# Patient Record
Sex: Female | Born: 1960 | Race: White | Hispanic: No | Marital: Married | Smoking: Never smoker
Health system: Southern US, Community
[De-identification: ages and names within clinical notes are randomized; demographics above are authoritative.]

## PROBLEM LIST (undated history)

## (undated) DIAGNOSIS — F329 Major depressive disorder, single episode, unspecified: Secondary | ICD-10-CM

## (undated) DIAGNOSIS — I10 Essential (primary) hypertension: Secondary | ICD-10-CM

## (undated) DIAGNOSIS — T8859XA Other complications of anesthesia, initial encounter: Secondary | ICD-10-CM

## (undated) DIAGNOSIS — R519 Headache, unspecified: Secondary | ICD-10-CM

## (undated) DIAGNOSIS — F419 Anxiety disorder, unspecified: Secondary | ICD-10-CM

## (undated) DIAGNOSIS — K219 Gastro-esophageal reflux disease without esophagitis: Secondary | ICD-10-CM

## (undated) DIAGNOSIS — M542 Cervicalgia: Secondary | ICD-10-CM

## (undated) DIAGNOSIS — R51 Headache: Secondary | ICD-10-CM

## (undated) DIAGNOSIS — D649 Anemia, unspecified: Secondary | ICD-10-CM

## (undated) DIAGNOSIS — M1990 Unspecified osteoarthritis, unspecified site: Secondary | ICD-10-CM

## (undated) DIAGNOSIS — K2970 Gastritis, unspecified, without bleeding: Secondary | ICD-10-CM

## (undated) DIAGNOSIS — D509 Iron deficiency anemia, unspecified: Principal | ICD-10-CM

## (undated) DIAGNOSIS — T4145XA Adverse effect of unspecified anesthetic, initial encounter: Secondary | ICD-10-CM

## (undated) DIAGNOSIS — F32A Depression, unspecified: Secondary | ICD-10-CM

## (undated) HISTORY — PX: COLONOSCOPY: SHX174

## (undated) HISTORY — DX: Iron deficiency anemia, unspecified: D50.9

## (undated) HISTORY — PX: CHOLECYSTECTOMY: SHX55

## (undated) HISTORY — PX: TONSILLECTOMY: SUR1361

## (undated) HISTORY — PX: APPENDECTOMY: SHX54

---

## 2000-09-23 HISTORY — PX: GASTRIC BYPASS: SHX52

## 2005-07-24 ENCOUNTER — Ambulatory Visit: Payer: Self-pay | Admitting: Obstetrics and Gynecology

## 2005-11-13 ENCOUNTER — Ambulatory Visit: Payer: Self-pay | Admitting: Internal Medicine

## 2005-11-21 ENCOUNTER — Ambulatory Visit: Payer: Self-pay | Admitting: Internal Medicine

## 2005-12-22 ENCOUNTER — Ambulatory Visit: Payer: Self-pay | Admitting: Internal Medicine

## 2006-01-21 ENCOUNTER — Ambulatory Visit: Payer: Self-pay | Admitting: Internal Medicine

## 2006-02-21 ENCOUNTER — Ambulatory Visit: Payer: Self-pay | Admitting: Internal Medicine

## 2006-03-23 ENCOUNTER — Ambulatory Visit: Payer: Self-pay | Admitting: Internal Medicine

## 2006-04-23 ENCOUNTER — Ambulatory Visit: Payer: Self-pay | Admitting: Internal Medicine

## 2006-05-24 ENCOUNTER — Ambulatory Visit: Payer: Self-pay | Admitting: Internal Medicine

## 2006-06-24 ENCOUNTER — Ambulatory Visit: Payer: Self-pay | Admitting: Internal Medicine

## 2006-07-24 ENCOUNTER — Ambulatory Visit: Payer: Self-pay | Admitting: Internal Medicine

## 2006-09-04 ENCOUNTER — Ambulatory Visit: Payer: Self-pay | Admitting: Obstetrics and Gynecology

## 2006-09-09 ENCOUNTER — Ambulatory Visit: Payer: Self-pay | Admitting: Obstetrics and Gynecology

## 2006-09-23 HISTORY — PX: ABDOMINAL HYSTERECTOMY: SHX81

## 2006-11-11 ENCOUNTER — Ambulatory Visit: Payer: Self-pay | Admitting: Internal Medicine

## 2006-11-22 ENCOUNTER — Ambulatory Visit: Payer: Self-pay | Admitting: Internal Medicine

## 2007-01-06 ENCOUNTER — Ambulatory Visit: Payer: Self-pay | Admitting: Internal Medicine

## 2007-01-22 ENCOUNTER — Ambulatory Visit: Payer: Self-pay | Admitting: Internal Medicine

## 2007-02-22 ENCOUNTER — Ambulatory Visit: Payer: Self-pay | Admitting: Internal Medicine

## 2007-03-24 ENCOUNTER — Ambulatory Visit: Payer: Self-pay | Admitting: Internal Medicine

## 2007-04-24 ENCOUNTER — Ambulatory Visit: Payer: Self-pay | Admitting: Internal Medicine

## 2007-05-25 ENCOUNTER — Ambulatory Visit: Payer: Self-pay | Admitting: Internal Medicine

## 2007-07-02 ENCOUNTER — Ambulatory Visit: Payer: Self-pay | Admitting: Internal Medicine

## 2007-07-25 ENCOUNTER — Ambulatory Visit: Payer: Self-pay | Admitting: Internal Medicine

## 2007-09-24 ENCOUNTER — Ambulatory Visit: Payer: Self-pay | Admitting: Internal Medicine

## 2007-10-21 ENCOUNTER — Ambulatory Visit: Payer: Self-pay | Admitting: Internal Medicine

## 2007-10-25 ENCOUNTER — Ambulatory Visit: Payer: Self-pay | Admitting: Internal Medicine

## 2007-12-23 ENCOUNTER — Ambulatory Visit: Payer: Self-pay | Admitting: Internal Medicine

## 2008-01-13 ENCOUNTER — Ambulatory Visit: Payer: Self-pay | Admitting: Internal Medicine

## 2008-01-22 ENCOUNTER — Ambulatory Visit: Payer: Self-pay | Admitting: Internal Medicine

## 2008-02-23 ENCOUNTER — Ambulatory Visit: Payer: Self-pay | Admitting: Internal Medicine

## 2008-03-23 ENCOUNTER — Ambulatory Visit: Payer: Self-pay | Admitting: Internal Medicine

## 2008-04-23 ENCOUNTER — Ambulatory Visit: Payer: Self-pay | Admitting: Internal Medicine

## 2008-05-25 ENCOUNTER — Ambulatory Visit: Payer: Self-pay | Admitting: Internal Medicine

## 2008-06-23 ENCOUNTER — Ambulatory Visit: Payer: Self-pay | Admitting: Internal Medicine

## 2008-07-24 ENCOUNTER — Ambulatory Visit: Payer: Self-pay | Admitting: Internal Medicine

## 2008-08-23 ENCOUNTER — Ambulatory Visit: Payer: Self-pay | Admitting: Internal Medicine

## 2008-09-23 ENCOUNTER — Ambulatory Visit: Payer: Self-pay | Admitting: Internal Medicine

## 2008-09-30 ENCOUNTER — Ambulatory Visit: Payer: Self-pay | Admitting: Internal Medicine

## 2008-10-24 ENCOUNTER — Ambulatory Visit: Payer: Self-pay | Admitting: Internal Medicine

## 2008-11-21 ENCOUNTER — Ambulatory Visit: Payer: Self-pay | Admitting: Internal Medicine

## 2008-12-22 ENCOUNTER — Ambulatory Visit: Payer: Self-pay | Admitting: Internal Medicine

## 2009-01-21 ENCOUNTER — Ambulatory Visit: Payer: Self-pay | Admitting: Internal Medicine

## 2009-02-21 ENCOUNTER — Ambulatory Visit: Payer: Self-pay | Admitting: Internal Medicine

## 2009-03-23 ENCOUNTER — Ambulatory Visit: Payer: Self-pay | Admitting: Internal Medicine

## 2009-04-23 ENCOUNTER — Ambulatory Visit: Payer: Self-pay | Admitting: Internal Medicine

## 2009-05-24 ENCOUNTER — Ambulatory Visit: Payer: Self-pay | Admitting: Internal Medicine

## 2009-06-23 ENCOUNTER — Ambulatory Visit: Payer: Self-pay | Admitting: Internal Medicine

## 2009-07-24 ENCOUNTER — Ambulatory Visit: Payer: Self-pay | Admitting: Internal Medicine

## 2009-08-23 ENCOUNTER — Ambulatory Visit: Payer: Self-pay | Admitting: Internal Medicine

## 2009-09-23 ENCOUNTER — Ambulatory Visit: Payer: Self-pay | Admitting: Internal Medicine

## 2009-09-29 ENCOUNTER — Ambulatory Visit: Payer: Self-pay | Admitting: Internal Medicine

## 2009-10-24 ENCOUNTER — Ambulatory Visit: Payer: Self-pay | Admitting: Internal Medicine

## 2009-11-21 ENCOUNTER — Ambulatory Visit: Payer: Self-pay | Admitting: Internal Medicine

## 2009-12-14 ENCOUNTER — Ambulatory Visit: Payer: Self-pay | Admitting: Obstetrics and Gynecology

## 2009-12-22 ENCOUNTER — Ambulatory Visit: Payer: Self-pay | Admitting: Internal Medicine

## 2010-01-21 ENCOUNTER — Ambulatory Visit: Payer: Self-pay | Admitting: Internal Medicine

## 2010-02-21 ENCOUNTER — Ambulatory Visit: Payer: Self-pay | Admitting: Internal Medicine

## 2010-03-23 ENCOUNTER — Ambulatory Visit: Payer: Self-pay | Admitting: Internal Medicine

## 2010-04-23 ENCOUNTER — Ambulatory Visit: Payer: Self-pay | Admitting: Internal Medicine

## 2010-05-24 ENCOUNTER — Ambulatory Visit: Payer: Self-pay | Admitting: Internal Medicine

## 2010-06-23 ENCOUNTER — Ambulatory Visit: Payer: Self-pay | Admitting: Internal Medicine

## 2010-07-24 ENCOUNTER — Ambulatory Visit: Payer: Self-pay | Admitting: Internal Medicine

## 2010-08-08 ENCOUNTER — Ambulatory Visit: Payer: Self-pay | Admitting: Obstetrics and Gynecology

## 2010-08-13 ENCOUNTER — Inpatient Hospital Stay: Payer: Self-pay | Admitting: Obstetrics and Gynecology

## 2010-08-16 ENCOUNTER — Inpatient Hospital Stay: Payer: Self-pay | Admitting: Obstetrics and Gynecology

## 2010-08-23 ENCOUNTER — Ambulatory Visit: Payer: Self-pay | Admitting: Internal Medicine

## 2010-09-23 ENCOUNTER — Ambulatory Visit: Payer: Self-pay | Admitting: Internal Medicine

## 2010-10-24 ENCOUNTER — Ambulatory Visit: Payer: Self-pay | Admitting: Internal Medicine

## 2010-11-22 ENCOUNTER — Ambulatory Visit: Payer: Self-pay | Admitting: Internal Medicine

## 2010-12-20 ENCOUNTER — Ambulatory Visit: Payer: Self-pay | Admitting: Obstetrics and Gynecology

## 2010-12-23 ENCOUNTER — Ambulatory Visit: Payer: Self-pay | Admitting: Internal Medicine

## 2011-01-14 ENCOUNTER — Ambulatory Visit: Payer: Self-pay | Admitting: Gastroenterology

## 2011-01-22 ENCOUNTER — Ambulatory Visit: Payer: Self-pay | Admitting: Internal Medicine

## 2011-02-22 ENCOUNTER — Ambulatory Visit: Payer: Self-pay | Admitting: Internal Medicine

## 2011-03-24 ENCOUNTER — Ambulatory Visit: Payer: Self-pay | Admitting: Internal Medicine

## 2011-04-24 ENCOUNTER — Ambulatory Visit: Payer: Self-pay | Admitting: Internal Medicine

## 2011-06-12 ENCOUNTER — Ambulatory Visit: Payer: Self-pay | Admitting: Internal Medicine

## 2011-06-24 ENCOUNTER — Ambulatory Visit: Payer: Self-pay | Admitting: Internal Medicine

## 2011-07-25 ENCOUNTER — Ambulatory Visit: Payer: Self-pay | Admitting: Internal Medicine

## 2011-08-08 ENCOUNTER — Ambulatory Visit: Payer: Self-pay

## 2011-08-24 ENCOUNTER — Ambulatory Visit: Payer: Self-pay | Admitting: Internal Medicine

## 2011-09-24 ENCOUNTER — Ambulatory Visit: Payer: Self-pay | Admitting: Internal Medicine

## 2011-10-25 ENCOUNTER — Ambulatory Visit: Payer: Self-pay | Admitting: Internal Medicine

## 2011-11-22 ENCOUNTER — Ambulatory Visit: Payer: Self-pay | Admitting: Internal Medicine

## 2011-12-02 LAB — IRON AND TIBC
Iron Bind.Cap.(Total): 233 ug/dL — ABNORMAL LOW (ref 250–450)
Iron Saturation: 25 %
Iron: 58 ug/dL (ref 50–170)
Unbound Iron-Bind.Cap.: 175 ug/dL

## 2011-12-02 LAB — CANCER CENTER HEMOGLOBIN: HGB: 12.4 g/dL (ref 12.0–16.0)

## 2011-12-23 ENCOUNTER — Ambulatory Visit: Payer: Self-pay | Admitting: Internal Medicine

## 2012-01-03 ENCOUNTER — Ambulatory Visit: Payer: Self-pay | Admitting: Obstetrics and Gynecology

## 2012-01-20 ENCOUNTER — Ambulatory Visit: Payer: Self-pay | Admitting: Pain Medicine

## 2012-01-22 ENCOUNTER — Ambulatory Visit: Payer: Self-pay | Admitting: Internal Medicine

## 2012-01-27 ENCOUNTER — Ambulatory Visit: Payer: Self-pay | Admitting: Pain Medicine

## 2012-02-03 ENCOUNTER — Ambulatory Visit: Payer: Self-pay | Admitting: Pain Medicine

## 2012-02-22 ENCOUNTER — Ambulatory Visit: Payer: Self-pay | Admitting: Internal Medicine

## 2012-02-27 LAB — HEMOGLOBIN: HGB: 13.4 g/dL (ref 12.0–16.0)

## 2012-03-23 ENCOUNTER — Ambulatory Visit: Payer: Self-pay | Admitting: Internal Medicine

## 2012-04-17 LAB — CBC CANCER CENTER
Basophil %: 0.2 %
Eosinophil #: 0.2 x10 3/mm (ref 0.0–0.7)
HCT: 40.5 % (ref 35.0–47.0)
HGB: 13.5 g/dL (ref 12.0–16.0)
Lymphocyte #: 2 x10 3/mm (ref 1.0–3.6)
MCH: 32.5 pg (ref 26.0–34.0)
MCV: 97 fL (ref 80–100)
Monocyte #: 0.7 x10 3/mm (ref 0.2–0.9)
Monocyte %: 8.4 %
Neutrophil %: 62.6 %
RBC: 4.16 10*6/uL (ref 3.80–5.20)
RDW: 12.8 % (ref 11.5–14.5)
WBC: 7.7 x10 3/mm (ref 3.6–11.0)

## 2012-04-17 LAB — FERRITIN: Ferritin (ARMC): 179 ng/mL (ref 8–388)

## 2012-04-17 LAB — IRON AND TIBC: Iron: 66 ug/dL (ref 50–170)

## 2012-04-23 ENCOUNTER — Ambulatory Visit: Payer: Self-pay | Admitting: Internal Medicine

## 2012-05-24 ENCOUNTER — Ambulatory Visit: Payer: Self-pay | Admitting: Internal Medicine

## 2012-06-09 ENCOUNTER — Encounter: Payer: Self-pay | Admitting: Neurology

## 2012-06-23 ENCOUNTER — Encounter: Payer: Self-pay | Admitting: Neurology

## 2012-07-03 ENCOUNTER — Ambulatory Visit: Payer: Self-pay | Admitting: Internal Medicine

## 2012-07-03 LAB — FERRITIN: Ferritin (ARMC): 139 ng/mL (ref 8–388)

## 2012-07-03 LAB — CANCER CENTER HEMOGLOBIN: HGB: 13.2 g/dL (ref 12.0–16.0)

## 2012-07-24 ENCOUNTER — Ambulatory Visit: Payer: Self-pay | Admitting: Internal Medicine

## 2012-08-10 ENCOUNTER — Ambulatory Visit: Payer: Self-pay | Admitting: Neurology

## 2012-09-14 ENCOUNTER — Ambulatory Visit: Payer: Self-pay | Admitting: Internal Medicine

## 2012-09-14 LAB — CANCER CENTER HEMOGLOBIN: HGB: 13.7 g/dL (ref 12.0–16.0)

## 2012-09-14 LAB — FERRITIN: Ferritin (ARMC): 195 ng/mL (ref 8–388)

## 2012-09-23 ENCOUNTER — Ambulatory Visit: Payer: Self-pay | Admitting: Internal Medicine

## 2012-10-24 ENCOUNTER — Ambulatory Visit: Payer: Self-pay | Admitting: Internal Medicine

## 2012-11-21 ENCOUNTER — Ambulatory Visit: Payer: Self-pay | Admitting: Internal Medicine

## 2012-12-22 ENCOUNTER — Ambulatory Visit: Payer: Self-pay | Admitting: Internal Medicine

## 2013-01-21 ENCOUNTER — Ambulatory Visit: Payer: Self-pay | Admitting: Internal Medicine

## 2013-01-29 LAB — CANCER CENTER HEMOGLOBIN: HGB: 13.3 g/dL (ref 12.0–16.0)

## 2013-02-21 ENCOUNTER — Ambulatory Visit: Payer: Self-pay | Admitting: Internal Medicine

## 2013-03-23 ENCOUNTER — Ambulatory Visit: Payer: Self-pay | Admitting: Internal Medicine

## 2013-04-02 ENCOUNTER — Ambulatory Visit: Payer: Self-pay | Admitting: Obstetrics and Gynecology

## 2013-04-02 LAB — CANCER CENTER HEMOGLOBIN: HGB: 12.7 g/dL (ref 12.0–16.0)

## 2013-04-23 ENCOUNTER — Ambulatory Visit: Payer: Self-pay | Admitting: Internal Medicine

## 2013-05-24 ENCOUNTER — Ambulatory Visit: Payer: Self-pay | Admitting: Internal Medicine

## 2013-06-25 ENCOUNTER — Ambulatory Visit: Payer: Self-pay | Admitting: Internal Medicine

## 2013-06-25 LAB — CANCER CENTER HEMOGLOBIN: HGB: 13.6 g/dL (ref 12.0–16.0)

## 2013-06-25 LAB — FERRITIN: Ferritin (ARMC): 80 ng/mL (ref 8–388)

## 2013-07-24 ENCOUNTER — Ambulatory Visit: Payer: Self-pay | Admitting: Internal Medicine

## 2013-08-27 ENCOUNTER — Ambulatory Visit: Payer: Self-pay | Admitting: Internal Medicine

## 2013-08-27 LAB — CANCER CENTER HEMOGLOBIN: HGB: 13.9 g/dL (ref 12.0–16.0)

## 2013-08-27 LAB — FERRITIN: Ferritin (ARMC): 118 ng/mL (ref 8–388)

## 2013-09-23 ENCOUNTER — Ambulatory Visit: Payer: Self-pay | Admitting: Internal Medicine

## 2013-10-28 ENCOUNTER — Ambulatory Visit: Payer: Self-pay | Admitting: Internal Medicine

## 2013-10-30 ENCOUNTER — Emergency Department: Payer: Self-pay | Admitting: Emergency Medicine

## 2013-11-06 ENCOUNTER — Ambulatory Visit: Payer: Self-pay | Admitting: Physical Medicine and Rehabilitation

## 2013-11-29 ENCOUNTER — Ambulatory Visit: Payer: Self-pay | Admitting: Internal Medicine

## 2013-11-29 LAB — FERRITIN: Ferritin (ARMC): 81 ng/mL (ref 8–388)

## 2013-11-29 LAB — CANCER CENTER HEMOGLOBIN: HGB: 13.1 g/dL (ref 12.0–16.0)

## 2013-12-22 ENCOUNTER — Ambulatory Visit: Payer: Self-pay | Admitting: Internal Medicine

## 2013-12-22 HISTORY — PX: JOINT REPLACEMENT: SHX530

## 2013-12-24 ENCOUNTER — Ambulatory Visit: Payer: Self-pay | Admitting: Physician Assistant

## 2013-12-24 LAB — BASIC METABOLIC PANEL
Anion Gap: 5 — ABNORMAL LOW (ref 7–16)
BUN: 14 mg/dL (ref 7–18)
CALCIUM: 8.5 mg/dL (ref 8.5–10.1)
CHLORIDE: 105 mmol/L (ref 98–107)
CREATININE: 0.69 mg/dL (ref 0.60–1.30)
Co2: 30 mmol/L (ref 21–32)
Glucose: 96 mg/dL (ref 65–99)
Osmolality: 280 (ref 275–301)
Potassium: 4.6 mmol/L (ref 3.5–5.1)
Sodium: 140 mmol/L (ref 136–145)

## 2013-12-24 LAB — CBC WITH DIFFERENTIAL/PLATELET
Basophil #: 0 10*3/uL (ref 0.0–0.1)
Basophil %: 0.5 %
EOS PCT: 2.7 %
Eosinophil #: 0.2 10*3/uL (ref 0.0–0.7)
HCT: 43.8 % (ref 35.0–47.0)
HGB: 14.4 g/dL (ref 12.0–16.0)
LYMPHS ABS: 1.7 10*3/uL (ref 1.0–3.6)
Lymphocyte %: 23.2 %
MCH: 30.6 pg (ref 26.0–34.0)
MCHC: 32.8 g/dL (ref 32.0–36.0)
MCV: 93 fL (ref 80–100)
Monocyte #: 0.7 x10 3/mm (ref 0.2–0.9)
Monocyte %: 10.2 %
NEUTROS PCT: 63.4 %
Neutrophil #: 4.5 10*3/uL (ref 1.4–6.5)
PLATELETS: 259 10*3/uL (ref 150–440)
RBC: 4.69 10*6/uL (ref 3.80–5.20)
RDW: 13.3 % (ref 11.5–14.5)
WBC: 7.1 10*3/uL (ref 3.6–11.0)

## 2014-01-05 ENCOUNTER — Ambulatory Visit: Payer: Self-pay | Admitting: General Practice

## 2014-01-05 LAB — CBC
HCT: 41.9 % (ref 35.0–47.0)
HGB: 13.6 g/dL (ref 12.0–16.0)
MCH: 29.9 pg (ref 26.0–34.0)
MCHC: 32.4 g/dL (ref 32.0–36.0)
MCV: 92 fL (ref 80–100)
Platelet: 228 10*3/uL (ref 150–440)
RBC: 4.54 10*6/uL (ref 3.80–5.20)
RDW: 13.3 % (ref 11.5–14.5)
WBC: 12.5 10*3/uL — ABNORMAL HIGH (ref 3.6–11.0)

## 2014-01-05 LAB — MRSA PCR SCREENING

## 2014-01-05 LAB — BASIC METABOLIC PANEL
Anion Gap: 4 — ABNORMAL LOW (ref 7–16)
BUN: 14 mg/dL (ref 7–18)
CHLORIDE: 104 mmol/L (ref 98–107)
CO2: 30 mmol/L (ref 21–32)
CREATININE: 0.66 mg/dL (ref 0.60–1.30)
Calcium, Total: 8.9 mg/dL (ref 8.5–10.1)
EGFR (Non-African Amer.): >60
GLUCOSE: 107 mg/dL — AB (ref 65–99)
OSMOLALITY: 277 (ref 275–301)
Potassium: 4 mmol/L (ref 3.5–5.1)
Sodium: 138 mmol/L (ref 136–145)

## 2014-01-05 LAB — URINALYSIS, COMPLETE
Bacteria: NONE SEEN
Blood: NEGATIVE
GLUCOSE, UR: NEGATIVE mg/dL (ref 0–75)
NITRITE: NEGATIVE
Ph: 5 (ref 4.5–8.0)
RBC,UR: 8 /HPF (ref 0–5)
SPECIFIC GRAVITY: 1.035 (ref 1.003–1.030)

## 2014-01-05 LAB — PROTIME-INR
INR: 1
Prothrombin Time: 13 secs (ref 11.5–14.7)

## 2014-01-05 LAB — APTT: ACTIVATED PTT: 26.9 s (ref 23.6–35.9)

## 2014-01-05 LAB — SEDIMENTATION RATE: ERYTHROCYTE SED RATE: 14 mm/h (ref 0–30)

## 2014-01-06 LAB — URINE CULTURE

## 2014-01-17 ENCOUNTER — Inpatient Hospital Stay: Payer: Self-pay | Admitting: General Practice

## 2014-01-18 LAB — BASIC METABOLIC PANEL
ANION GAP: 4 — AB (ref 7–16)
BUN: 8 mg/dL (ref 7–18)
Calcium, Total: 8.4 mg/dL — ABNORMAL LOW (ref 8.5–10.1)
Chloride: 106 mmol/L (ref 98–107)
Co2: 29 mmol/L (ref 21–32)
Creatinine: 0.6 mg/dL (ref 0.60–1.30)
EGFR (African American): >60
Glucose: 124 mg/dL — ABNORMAL HIGH (ref 65–99)
Osmolality: 277 (ref 275–301)
POTASSIUM: 4.3 mmol/L (ref 3.5–5.1)
SODIUM: 139 mmol/L (ref 136–145)

## 2014-01-18 LAB — HEMOGLOBIN: HGB: 10.4 g/dL — AB (ref 12.0–16.0)

## 2014-01-18 LAB — PLATELET COUNT: Platelet: 176 10*3/uL (ref 150–440)

## 2014-01-19 LAB — BASIC METABOLIC PANEL
Anion Gap: 6 — ABNORMAL LOW (ref 7–16)
BUN: 7 mg/dL (ref 7–18)
CHLORIDE: 106 mmol/L (ref 98–107)
Calcium, Total: 8.1 mg/dL — ABNORMAL LOW (ref 8.5–10.1)
Co2: 29 mmol/L (ref 21–32)
Creatinine: 0.54 mg/dL — ABNORMAL LOW (ref 0.60–1.30)
EGFR (Non-African Amer.): >60
Glucose: 100 mg/dL — ABNORMAL HIGH (ref 65–99)
Osmolality: 279 (ref 275–301)
Potassium: 3.7 mmol/L (ref 3.5–5.1)
SODIUM: 141 mmol/L (ref 136–145)

## 2014-01-19 LAB — PLATELET COUNT: Platelet: 136 10*3/uL — ABNORMAL LOW (ref 150–440)

## 2014-01-19 LAB — HEMOGLOBIN: HGB: 9.4 g/dL — ABNORMAL LOW (ref 12.0–16.0)

## 2014-01-19 LAB — PATHOLOGY REPORT

## 2014-02-23 ENCOUNTER — Other Ambulatory Visit: Payer: Self-pay | Admitting: Neurosurgery

## 2014-03-03 ENCOUNTER — Encounter (HOSPITAL_COMMUNITY): Payer: Self-pay | Admitting: Pharmacy Technician

## 2014-03-04 ENCOUNTER — Inpatient Hospital Stay (HOSPITAL_COMMUNITY)
Admission: RE | Admit: 2014-03-04 | Discharge: 2014-03-04 | Disposition: A | Discharge status: Home / Self Care | Payer: Self-pay | Source: Ambulatory Visit

## 2014-03-04 NOTE — Pre-Procedure Instructions (Signed)
[name]  03/04/2014   Your procedure is scheduled on:  Monday June 22 nd at 1148 AM  Report to [facility] Admitting at [phone] AM.  Call this number if you have problems the morning of surgery: [phone]   Remember:   Do not eat food or drink liquids after midnight Sunday.   Take these medicines the morning of surgery with A SIP OF WATER: Gabapentin (Neurontin), Omeprazole (Prilosec), and Tramadol (Ultram) if needed for pain.  Stop Aspirin ,Nsaids (Meloxicam, Aleve, Advil, Naproxen, Ibuprofen), Multivitamins and herbal meds 7 days prior to surgery.   Do not wear jewelry, make-up or nail polish.  Do not wear lotions, powders, or perfumes. You may wear deodorant.  Do not shave 48 hours prior to surgery.   Do not bring valuables to the hospital.  [facility] is not responsible  for any belongings or valuables.                                 Contacts, dentures or bridgework may not be worn into surgery.  Leave suitcase in the car. After surgery it may be brought to your room.  For patients admitted to the hospital, discharge time is determined by your  treatment team.               Patients discharged the day of surgery will not be allowed to drive home.  Special Instructions: [place] - Preparing for Surgery  Before surgery, you can play an important role.  Because skin is not sterile, your skin needs to be as free of germs as possible.  You can reduce the number of germs on you skin by washing with CHG (chlorahexidine gluconate) soap before surgery.  CHG is an antiseptic cleaner which kills germs and bonds with the skin to continue killing germs even after washing.  Please DO NOT use if you have an allergy to CHG or antibacterial soaps.  If your skin becomes reddened/irritated stop using the CHG and inform your nurse when you arrive at Short Stay.  Do not shave (including legs and underarms) for at least 48 hours prior to the first CHG shower.  You may  shave your face.  Please follow these instructions carefully:   1.  Shower with CHG Soap the night before surgery and the                                morning of Surgery.  2.  If you choose to wash your hair, wash your hair first as usual with your       normal shampoo.  3.  After you shampoo, rinse your hair and body thoroughly to remove the                      Shampoo.  4.  Use CHG as you would any other liquid soap.  You can apply chg directly       to the skin and wash gently with scrungie or a clean washcloth.  5.  Apply the CHG Soap to your body ONLY FROM THE NECK DOWN.        Do not use on open wounds or open sores.  Avoid contact with your eyes,       ears, mouth and genitals (private parts).  Wash genitals (private parts)  with your normal soap.  6.  Wash thoroughly, paying special attention to the area where your surgery        will be performed.  7.  Thoroughly rinse your body with warm water from the neck down.  8.  DO NOT shower/wash with your normal soap after using and rinsing off       the CHG Soap.  9.  Pat yourself dry with a clean towel.            10.  Wear clean pajamas.            11.  Place clean sheets on your bed the night of your first shower and do not        sleep with pets.  Day of Surgery  Do not apply any lotions/deoderants the morning of surgery.  Please wear clean clothes to the hospital/surgery center.      Please read over the following fact sheets that you were given: Pain Booklet, Coughing and Deep Breathing, Blood Transfusion Information, MRSA Information and Surgical Site Infection Prevention

## 2014-03-08 ENCOUNTER — Encounter (HOSPITAL_COMMUNITY): Payer: Self-pay

## 2014-03-08 ENCOUNTER — Encounter (HOSPITAL_COMMUNITY)
Admission: RE | Admit: 2014-03-08 | Discharge: 2014-03-08 | Disposition: A | Discharge status: Home / Self Care | Payer: BC Managed Care – PPO | Source: Ambulatory Visit | Attending: Anesthesiology | Admitting: Anesthesiology

## 2014-03-08 ENCOUNTER — Encounter (HOSPITAL_COMMUNITY)
Admission: RE | Admit: 2014-03-08 | Discharge: 2014-03-08 | Disposition: A | Discharge status: Home / Self Care | Payer: BC Managed Care – PPO | Source: Ambulatory Visit | Attending: Neurosurgery | Admitting: Neurosurgery

## 2014-03-08 DIAGNOSIS — Z01812 Encounter for preprocedural laboratory examination: Secondary | ICD-10-CM | POA: Insufficient documentation

## 2014-03-08 DIAGNOSIS — Z01818 Encounter for other preprocedural examination: Secondary | ICD-10-CM | POA: Diagnosis not present

## 2014-03-08 DIAGNOSIS — Z01810 Encounter for preprocedural cardiovascular examination: Secondary | ICD-10-CM | POA: Diagnosis not present

## 2014-03-08 HISTORY — DX: Unspecified osteoarthritis, unspecified site: M19.90

## 2014-03-08 HISTORY — DX: Other complications of anesthesia, initial encounter: T88.59XA

## 2014-03-08 HISTORY — DX: Essential (primary) hypertension: I10

## 2014-03-08 HISTORY — DX: Anemia, unspecified: D64.9

## 2014-03-08 HISTORY — DX: Adverse effect of unspecified anesthetic, initial encounter: T41.45XA

## 2014-03-08 HISTORY — DX: Gastro-esophageal reflux disease without esophagitis: K21.9

## 2014-03-08 LAB — CBC WITH DIFFERENTIAL/PLATELET
BASOS ABS: 0 10*3/uL (ref 0.0–0.1)
Basophils Relative: 0 % (ref 0–1)
Eosinophils Absolute: 0.2 10*3/uL (ref 0.0–0.7)
Eosinophils Relative: 3 % (ref 0–5)
HEMATOCRIT: 39.9 % (ref 36.0–46.0)
Hemoglobin: 12.4 g/dL (ref 12.0–15.0)
LYMPHS ABS: 2 10*3/uL (ref 0.7–4.0)
Lymphocytes Relative: 31 % (ref 12–46)
MCH: 28.7 pg (ref 26.0–34.0)
MCHC: 31.1 g/dL (ref 30.0–36.0)
MCV: 92.4 fL (ref 78.0–100.0)
Monocytes Absolute: 0.5 10*3/uL (ref 0.1–1.0)
Monocytes Relative: 8 % (ref 3–12)
NEUTROS ABS: 3.8 10*3/uL (ref 1.7–7.7)
Neutrophils Relative %: 58 % (ref 43–77)
PLATELETS: 255 10*3/uL (ref 150–400)
RBC: 4.32 MIL/uL (ref 3.87–5.11)
RDW: 14.1 % (ref 11.5–15.5)
WBC: 6.6 10*3/uL (ref 4.0–10.5)

## 2014-03-08 LAB — SURGICAL PCR SCREEN
MRSA, PCR: NEGATIVE
STAPHYLOCOCCUS AUREUS: NEGATIVE

## 2014-03-08 LAB — TYPE AND SCREEN
ABO/RH(D): B POS
ANTIBODY SCREEN: NEGATIVE

## 2014-03-08 LAB — BASIC METABOLIC PANEL
BUN: 14 mg/dL (ref 6–23)
CALCIUM: 9 mg/dL (ref 8.4–10.5)
CO2: 28 mEq/L (ref 19–32)
Chloride: 103 mEq/L (ref 96–112)
Creatinine, Ser: 0.62 mg/dL (ref 0.50–1.10)
GFR calc non Af Amer: >90 mL/min (ref >90)
Glucose, Bld: 99 mg/dL (ref 70–99)
POTASSIUM: 4.6 meq/L (ref 3.7–5.3)
SODIUM: 141 meq/L (ref 137–147)

## 2014-03-08 LAB — ABO/RH: ABO/RH(D): B POS

## 2014-03-13 MED ORDER — CEFAZOLIN SODIUM-DEXTROSE 2-3 GM-% IV SOLR
2.0000 g | INTRAVENOUS | Status: AC
  Administered 2014-03-14: 2 g via INTRAVENOUS
  Filled 2014-03-13: qty 50

## 2014-03-14 ENCOUNTER — Inpatient Hospital Stay (HOSPITAL_COMMUNITY): Payer: BC Managed Care – PPO

## 2014-03-14 ENCOUNTER — Encounter (HOSPITAL_COMMUNITY)
Admission: RE | Disposition: A | Discharge status: Home / Self Care | Payer: BC Managed Care – PPO | Source: Ambulatory Visit | Attending: Neurosurgery

## 2014-03-14 ENCOUNTER — Encounter (HOSPITAL_COMMUNITY): Payer: Self-pay | Admitting: Surgery

## 2014-03-14 ENCOUNTER — Inpatient Hospital Stay (HOSPITAL_COMMUNITY)
Admission: RE | Admit: 2014-03-14 | Discharge: 2014-03-15 | DRG: 460 | Disposition: A | Discharge status: Home / Self Care | Payer: BC Managed Care – PPO | Source: Ambulatory Visit | Attending: Neurosurgery | Admitting: Neurosurgery

## 2014-03-14 ENCOUNTER — Inpatient Hospital Stay (HOSPITAL_COMMUNITY): Payer: BC Managed Care – PPO | Admitting: Certified Registered"

## 2014-03-14 ENCOUNTER — Encounter (HOSPITAL_COMMUNITY): Payer: BC Managed Care – PPO | Admitting: Certified Registered"

## 2014-03-14 DIAGNOSIS — Z96649 Presence of unspecified artificial hip joint: Secondary | ICD-10-CM

## 2014-03-14 DIAGNOSIS — M4310 Spondylolisthesis, site unspecified: Principal | ICD-10-CM | POA: Diagnosis present

## 2014-03-14 DIAGNOSIS — K219 Gastro-esophageal reflux disease without esophagitis: Secondary | ICD-10-CM | POA: Diagnosis present

## 2014-03-14 DIAGNOSIS — M48061 Spinal stenosis, lumbar region without neurogenic claudication: Secondary | ICD-10-CM | POA: Diagnosis present

## 2014-03-14 DIAGNOSIS — I10 Essential (primary) hypertension: Secondary | ICD-10-CM | POA: Diagnosis present

## 2014-03-14 HISTORY — PX: BACK SURGERY: SHX140

## 2014-03-14 SURGERY — POSTERIOR LUMBAR FUSION 1 LEVEL
Anesthesia: General | Site: Spine Lumbar

## 2014-03-14 MED ORDER — NEOSTIGMINE METHYLSULFATE 10 MG/10ML IV SOLN
INTRAVENOUS | Status: DC | PRN
  Administered 2014-03-14: 4 mg via INTRAVENOUS

## 2014-03-14 MED ORDER — TRAMADOL HCL 50 MG PO TABS: 50.0000 mg | ORAL_TABLET | Freq: Four times a day (QID) | ORAL | Status: DC | PRN

## 2014-03-14 MED ORDER — LACTATED RINGERS IV SOLN
INTRAVENOUS | Status: DC | PRN
  Administered 2014-03-14 (×3): via INTRAVENOUS

## 2014-03-14 MED ORDER — ARTIFICIAL TEARS OP OINT
TOPICAL_OINTMENT | OPHTHALMIC | Status: DC | PRN
  Administered 2014-03-14: 1 via OPHTHALMIC

## 2014-03-14 MED ORDER — SENNA 8.6 MG PO TABS
1.0000 | ORAL_TABLET | Freq: Two times a day (BID) | ORAL | Status: DC
  Administered 2014-03-14 – 2014-03-15 (×2): 8.6 mg via ORAL
  Filled 2014-03-14 (×3): qty 1

## 2014-03-14 MED ORDER — ALUM & MAG HYDROXIDE-SIMETH 200-200-20 MG/5ML PO SUSP: 30.0000 mL | Freq: Four times a day (QID) | ORAL | Status: DC | PRN

## 2014-03-14 MED ORDER — LIDOCAINE HCL (CARDIAC) 20 MG/ML IV SOLN
INTRAVENOUS | Status: AC
  Filled 2014-03-14: qty 5

## 2014-03-14 MED ORDER — VANCOMYCIN HCL 1000 MG IV SOLR
INTRAVENOUS | Status: DC | PRN
  Administered 2014-03-14: 1000 mg via TOPICAL

## 2014-03-14 MED ORDER — BISACODYL 10 MG RE SUPP: 10.0000 mg | Freq: Every day | RECTAL | Status: DC | PRN

## 2014-03-14 MED ORDER — 0.9 % SODIUM CHLORIDE (POUR BTL) OPTIME
TOPICAL | Status: DC | PRN
  Administered 2014-03-14: 1000 mL

## 2014-03-14 MED ORDER — MIDAZOLAM HCL 5 MG/5ML IJ SOLN
INTRAMUSCULAR | Status: DC | PRN
  Administered 2014-03-14: 2 mg via INTRAVENOUS

## 2014-03-14 MED ORDER — MIDAZOLAM HCL 2 MG/2ML IJ SOLN
INTRAMUSCULAR | Status: AC
  Filled 2014-03-14: qty 2

## 2014-03-14 MED ORDER — GABAPENTIN 300 MG PO CAPS
600.0000 mg | ORAL_CAPSULE | Freq: Every day | ORAL | Status: DC
  Administered 2014-03-14: 600 mg via ORAL
  Filled 2014-03-14 (×2): qty 2

## 2014-03-14 MED ORDER — FENTANYL CITRATE 0.05 MG/ML IJ SOLN
INTRAMUSCULAR | Status: DC | PRN
  Administered 2014-03-14 (×2): 50 ug via INTRAVENOUS
  Administered 2014-03-14: 150 ug via INTRAVENOUS

## 2014-03-14 MED ORDER — PHENOL 1.4 % MT LIQD: 1.0000 | OROMUCOSAL | Status: DC | PRN

## 2014-03-14 MED ORDER — PROPOFOL 10 MG/ML IV BOLUS
INTRAVENOUS | Status: AC
  Filled 2014-03-14: qty 20

## 2014-03-14 MED ORDER — MENTHOL 3 MG MT LOZG: 1.0000 | LOZENGE | OROMUCOSAL | Status: DC | PRN

## 2014-03-14 MED ORDER — ACETAMINOPHEN 650 MG RE SUPP: 650.0000 mg | RECTAL | Status: DC | PRN

## 2014-03-14 MED ORDER — HYDROMORPHONE HCL PF 1 MG/ML IJ SOLN
0.2500 mg | INTRAMUSCULAR | Status: DC | PRN
  Administered 2014-03-14 (×4): 0.5 mg via INTRAVENOUS

## 2014-03-14 MED ORDER — FENTANYL CITRATE 0.05 MG/ML IJ SOLN
INTRAMUSCULAR | Status: AC
  Filled 2014-03-14: qty 5

## 2014-03-14 MED ORDER — DIAZEPAM 5 MG PO TABS
5.0000 mg | ORAL_TABLET | Freq: Four times a day (QID) | ORAL | Status: DC | PRN
Start: 2014-03-14 — End: 2014-03-15
  Administered 2014-03-14 (×2): 5 mg via ORAL
  Filled 2014-03-14: qty 1

## 2014-03-14 MED ORDER — THROMBIN 20000 UNITS EX SOLR
CUTANEOUS | Status: DC | PRN
  Administered 2014-03-14: 13:00:00 via TOPICAL

## 2014-03-14 MED ORDER — GLYCOPYRROLATE 0.2 MG/ML IJ SOLN
INTRAMUSCULAR | Status: DC | PRN
  Administered 2014-03-14: 0.6 mg via INTRAVENOUS

## 2014-03-14 MED ORDER — HYDROMORPHONE HCL PF 1 MG/ML IJ SOLN
0.5000 mg | INTRAMUSCULAR | Status: DC | PRN
  Administered 2014-03-14: 1 mg via INTRAVENOUS

## 2014-03-14 MED ORDER — ONDANSETRON HCL 4 MG/2ML IJ SOLN
INTRAMUSCULAR | Status: DC | PRN
  Administered 2014-03-14: 4 mg via INTRAVENOUS

## 2014-03-14 MED ORDER — PHENYLEPHRINE HCL 10 MG/ML IJ SOLN
INTRAMUSCULAR | Status: DC | PRN
  Administered 2014-03-14 (×5): 40 ug via INTRAVENOUS

## 2014-03-14 MED ORDER — DEXAMETHASONE SODIUM PHOSPHATE 10 MG/ML IJ SOLN
INTRAMUSCULAR | Status: AC
  Filled 2014-03-14: qty 1

## 2014-03-14 MED ORDER — HYDROMORPHONE HCL PF 1 MG/ML IJ SOLN
INTRAMUSCULAR | Status: AC
  Filled 2014-03-14: qty 1

## 2014-03-14 MED ORDER — BUPIVACAINE HCL (PF) 0.25 % IJ SOLN
INTRAMUSCULAR | Status: DC | PRN
  Administered 2014-03-14: 20 mL

## 2014-03-14 MED ORDER — ROCURONIUM BROMIDE 100 MG/10ML IV SOLN
INTRAVENOUS | Status: DC | PRN
  Administered 2014-03-14 (×2): 10 mg via INTRAVENOUS
  Administered 2014-03-14: 40 mg via INTRAVENOUS
  Administered 2014-03-14: 10 mg via INTRAVENOUS
  Administered 2014-03-14: 15 mg via INTRAVENOUS

## 2014-03-14 MED ORDER — VANCOMYCIN HCL 1000 MG IV SOLR
INTRAVENOUS | Status: AC
  Filled 2014-03-14: qty 1000

## 2014-03-14 MED ORDER — SODIUM CHLORIDE 0.9 % IJ SOLN: 3.0000 mL | INTRAMUSCULAR | Status: DC | PRN

## 2014-03-14 MED ORDER — ROCURONIUM BROMIDE 50 MG/5ML IV SOLN
INTRAVENOUS | Status: AC
Start: 2014-03-14 — End: 2014-03-14
  Filled 2014-03-14: qty 1

## 2014-03-14 MED ORDER — PANTOPRAZOLE SODIUM 40 MG PO TBEC
40.0000 mg | DELAYED_RELEASE_TABLET | Freq: Every day | ORAL | Status: DC
Start: 2014-03-15 — End: 2014-03-15
  Administered 2014-03-15: 40 mg via ORAL
  Filled 2014-03-14: qty 1

## 2014-03-14 MED ORDER — SODIUM CHLORIDE 0.9 % IJ SOLN
3.0000 mL | Freq: Two times a day (BID) | INTRAMUSCULAR | Status: DC
  Administered 2014-03-14: 3 mL via INTRAVENOUS

## 2014-03-14 MED ORDER — POLYETHYLENE GLYCOL 3350 17 G PO PACK
17.0000 g | PACK | Freq: Every day | ORAL | Status: DC | PRN
  Filled 2014-03-14: qty 1

## 2014-03-14 MED ORDER — PROPOFOL 10 MG/ML IV BOLUS
INTRAVENOUS | Status: DC | PRN
  Administered 2014-03-14: 150 mg via INTRAVENOUS

## 2014-03-14 MED ORDER — LACTATED RINGERS IV SOLN
INTRAVENOUS | Status: DC
  Administered 2014-03-14: 11:00:00 via INTRAVENOUS

## 2014-03-14 MED ORDER — DEXAMETHASONE SODIUM PHOSPHATE 10 MG/ML IJ SOLN
10.0000 mg | INTRAMUSCULAR | Status: AC
  Administered 2014-03-14: 10 mg via INTRAVENOUS

## 2014-03-14 MED ORDER — ONDANSETRON HCL 4 MG/2ML IJ SOLN: 4.0000 mg | INTRAMUSCULAR | Status: DC | PRN

## 2014-03-14 MED ORDER — CEFAZOLIN SODIUM 1-5 GM-% IV SOLN
1.0000 g | Freq: Three times a day (TID) | INTRAVENOUS | Status: AC
  Administered 2014-03-14 – 2014-03-15 (×2): 1 g via INTRAVENOUS
  Filled 2014-03-14 (×2): qty 50

## 2014-03-14 MED ORDER — ROCURONIUM BROMIDE 50 MG/5ML IV SOLN
INTRAVENOUS | Status: AC
  Filled 2014-03-14: qty 1

## 2014-03-14 MED ORDER — DIAZEPAM 5 MG PO TABS
ORAL_TABLET | ORAL | Status: AC
  Filled 2014-03-14: qty 1

## 2014-03-14 MED ORDER — LOSARTAN POTASSIUM 25 MG PO TABS
25.0000 mg | ORAL_TABLET | Freq: Every day | ORAL | Status: DC
  Administered 2014-03-15: 25 mg via ORAL
  Filled 2014-03-14: qty 1

## 2014-03-14 MED ORDER — ONDANSETRON HCL 4 MG/2ML IJ SOLN
INTRAMUSCULAR | Status: AC
  Filled 2014-03-14: qty 2

## 2014-03-14 MED ORDER — LIDOCAINE HCL (CARDIAC) 20 MG/ML IV SOLN
INTRAVENOUS | Status: DC | PRN
  Administered 2014-03-14: 80 mg via INTRAVENOUS

## 2014-03-14 MED ORDER — SUCCINYLCHOLINE CHLORIDE 20 MG/ML IJ SOLN
INTRAMUSCULAR | Status: AC
  Filled 2014-03-14: qty 1

## 2014-03-14 MED ORDER — SODIUM CHLORIDE 0.9 % IR SOLN
Status: DC | PRN
  Administered 2014-03-14: 13:00:00

## 2014-03-14 MED ORDER — ARTIFICIAL TEARS OP OINT
TOPICAL_OINTMENT | OPHTHALMIC | Status: AC
  Filled 2014-03-14: qty 3.5

## 2014-03-14 MED ORDER — FLEET ENEMA 7-19 GM/118ML RE ENEM: 1.0000 | ENEMA | Freq: Once | RECTAL | Status: AC | PRN

## 2014-03-14 MED ORDER — ACETAMINOPHEN 325 MG PO TABS: 650.0000 mg | ORAL_TABLET | ORAL | Status: DC | PRN

## 2014-03-14 MED ORDER — OXYCODONE-ACETAMINOPHEN 5-325 MG PO TABS: 1.0000 | ORAL_TABLET | ORAL | Status: DC | PRN

## 2014-03-14 MED ORDER — HYDROCODONE-ACETAMINOPHEN 5-325 MG PO TABS
1.0000 | ORAL_TABLET | ORAL | Status: DC | PRN
Start: 2014-03-14 — End: 2014-03-15
  Administered 2014-03-14 – 2014-03-15 (×4): 2 via ORAL
  Filled 2014-03-14 (×4): qty 2

## 2014-03-14 SURGICAL SUPPLY — 72 items
BAG DECANTER FOR FLEXI CONT (MISCELLANEOUS) ×3 IMPLANT
BENZOIN TINCTURE PRP APPL 2/3 (GAUZE/BANDAGES/DRESSINGS) ×3 IMPLANT
BLADE 10 SAFETY STRL DISP (BLADE) IMPLANT
BLADE SURG ROTATE 9660 (MISCELLANEOUS) IMPLANT
BRUSH SCRUB EZ PLAIN DRY (MISCELLANEOUS) ×3 IMPLANT
BUR MATCHSTICK NEURO 3.0 LAGG (BURR) ×3 IMPLANT
CAGE 10X22 (Cage) ×3 IMPLANT
CANISTER SUCT 3000ML (MISCELLANEOUS) ×3 IMPLANT
CAP LCK SPNE (Orthopedic Implant) ×4 IMPLANT
CAP LOCK SPINE RADIUS (Orthopedic Implant) ×4 IMPLANT
CAP LOCKING (Orthopedic Implant) ×8 IMPLANT
CLOSURE WOUND 1/2 X4 (GAUZE/BANDAGES/DRESSINGS) ×2
CONT SPEC 4OZ CLIKSEAL STRL BL (MISCELLANEOUS) ×6 IMPLANT
COVER BACK TABLE 24X17X13 BIG (DRAPES) IMPLANT
COVER TABLE BACK 60X90 (DRAPES) ×3 IMPLANT
DECANTER SPIKE VIAL GLASS SM (MISCELLANEOUS) IMPLANT
DERMABOND ADHESIVE PROPEN (GAUZE/BANDAGES/DRESSINGS) ×2
DERMABOND ADVANCED (GAUZE/BANDAGES/DRESSINGS)
DERMABOND ADVANCED .7 DNX12 (GAUZE/BANDAGES/DRESSINGS) IMPLANT
DERMABOND ADVANCED .7 DNX6 (GAUZE/BANDAGES/DRESSINGS) ×1 IMPLANT
DRAPE C-ARM 42X72 X-RAY (DRAPES) ×6 IMPLANT
DRAPE LAPAROTOMY 100X72X124 (DRAPES) ×3 IMPLANT
DRAPE POUCH INSTRU U-SHP 10X18 (DRAPES) ×3 IMPLANT
DRAPE PROXIMA HALF (DRAPES) ×3 IMPLANT
DRAPE SURG 17X23 STRL (DRAPES) ×12 IMPLANT
DURAPREP 26ML APPLICATOR (WOUND CARE) ×3 IMPLANT
ELECT REM PT RETURN 9FT ADLT (ELECTROSURGICAL) ×3
ELECTRODE REM PT RTRN 9FT ADLT (ELECTROSURGICAL) ×1 IMPLANT
EVACUATOR 1/8 PVC DRAIN (DRAIN) IMPLANT
GAUZE SPONGE 4X4 16PLY XRAY LF (GAUZE/BANDAGES/DRESSINGS) IMPLANT
GLOVE BIO SURGEON STRL SZ8 (GLOVE) ×3 IMPLANT
GLOVE BIOGEL PI IND STRL 7.5 (GLOVE) ×2 IMPLANT
GLOVE BIOGEL PI IND STRL 8 (GLOVE) ×1 IMPLANT
GLOVE BIOGEL PI INDICATOR 7.5 (GLOVE) ×4
GLOVE BIOGEL PI INDICATOR 8 (GLOVE) ×2
GLOVE ECLIPSE 7.5 STRL STRAW (GLOVE) ×9 IMPLANT
GLOVE ECLIPSE 9.0 STRL (GLOVE) ×6 IMPLANT
GLOVE EXAM NITRILE LRG STRL (GLOVE) IMPLANT
GLOVE EXAM NITRILE MD LF STRL (GLOVE) IMPLANT
GLOVE EXAM NITRILE XL STR (GLOVE) IMPLANT
GLOVE EXAM NITRILE XS STR PU (GLOVE) IMPLANT
GLOVE INDICATOR 8.5 STRL (GLOVE) ×3 IMPLANT
GLOVE SS N UNI LF 7.5 STRL (GLOVE) ×3 IMPLANT
GLOVE SURG SS PI 7.0 STRL IVOR (GLOVE) ×3 IMPLANT
GOWN STRL REUS W/ TWL LRG LVL3 (GOWN DISPOSABLE) ×1 IMPLANT
GOWN STRL REUS W/ TWL XL LVL3 (GOWN DISPOSABLE) ×3 IMPLANT
GOWN STRL REUS W/TWL 2XL LVL3 (GOWN DISPOSABLE) ×3 IMPLANT
GOWN STRL REUS W/TWL LRG LVL3 (GOWN DISPOSABLE) ×2
GOWN STRL REUS W/TWL XL LVL3 (GOWN DISPOSABLE) ×6
KIT BASIN OR (CUSTOM PROCEDURE TRAY) ×3 IMPLANT
KIT ROOM TURNOVER OR (KITS) ×3 IMPLANT
MILL MEDIUM DISP (BLADE) ×3 IMPLANT
NEEDLE HYPO 22GX1.5 SAFETY (NEEDLE) ×3 IMPLANT
NS IRRIG 1000ML POUR BTL (IV SOLUTION) ×3 IMPLANT
PACK LAMINECTOMY NEURO (CUSTOM PROCEDURE TRAY) ×3 IMPLANT
ROD RADIUS 40MM (Neuro Prosthesis/Implant) ×4 IMPLANT
ROD SPNL 40X5.5XNS TI RDS (Neuro Prosthesis/Implant) ×2 IMPLANT
SCREW 5.75X40M (Screw) ×12 IMPLANT
SPONGE GAUZE 4X4 12PLY (GAUZE/BANDAGES/DRESSINGS) ×3 IMPLANT
SPONGE SURGIFOAM ABS GEL 100 (HEMOSTASIS) ×3 IMPLANT
STRIP CLOSURE SKIN 1/2X4 (GAUZE/BANDAGES/DRESSINGS) ×4 IMPLANT
SUT VIC AB 0 CT1 18XCR BRD8 (SUTURE) ×1 IMPLANT
SUT VIC AB 0 CT1 8-18 (SUTURE) ×2
SUT VIC AB 2-0 CT1 18 (SUTURE) ×3 IMPLANT
SUT VIC AB 3-0 SH 8-18 (SUTURE) ×6 IMPLANT
SYR 20ML ECCENTRIC (SYRINGE) ×3 IMPLANT
TAPE CLOTH SURG 4X10 WHT LF (GAUZE/BANDAGES/DRESSINGS) ×3 IMPLANT
TOWEL OR 17X24 6PK STRL BLUE (TOWEL DISPOSABLE) ×3 IMPLANT
TOWEL OR 17X26 10 PK STRL BLUE (TOWEL DISPOSABLE) ×3 IMPLANT
TRAY FOLEY CATH 14FRSI W/METER (CATHETERS) ×3 IMPLANT
WATER STERILE IRR 1000ML POUR (IV SOLUTION) ×3 IMPLANT
WEDGE TANGENT 10X26MM ×3 IMPLANT

## 2014-03-14 NOTE — H&P (Signed)
[name] is an 53 y.o. female.   Chief Complaint: Back and bilateral leg pain HPI: 53 year old female with severe bilateral lower extremity pain consistent with L4 radiculopathies. Workup demonstrates evidence of a grade 1 L4-5 degenerative spondylolisthesis with marked L4 foraminal stenosis. Patient has failed conservative management and presents now for decompression and fusion surgery in hopes of improving her symptoms.  Past Medical History  Diagnosis Date  . Complication of anesthesia     loss control of bowels after block  . Hypertension   . GERD (gastroesophageal reflux disease)   . Arthritis   . Anemia     Past Surgical History  Procedure Laterality Date  . Abdominal hysterectomy  2008  . Tonsillectomy    . Appendectomy    . Joint replacement Left 12/2013    [facility]  . Cholecystectomy    . Colonoscopy      History reviewed. No pertinent family history. Social History:  reports that she has never smoked. She has never used smokeless tobacco. She reports that she does not drink alcohol or use illicit drugs.  Allergies: No Known Allergies  Medications Prior to Admission  Medication Sig Dispense Refill  . gabapentin (NEURONTIN) 600 MG tablet Take 600 mg by mouth at bedtime.      [name]. losartan (COZAAR) 25 MG tablet Take 25 mg by mouth daily.      . meloxicam (MOBIC) 7.5 MG tablet Take 7.5 mg by mouth daily.      [name]. omeprazole (PRILOSEC) 40 MG capsule Take 40 mg by mouth daily.      . traMADol (ULTRAM) 50 MG tablet Take 50 mg by mouth every 6 (six) hours as needed for moderate pain.        No results found for this or any previous visit (from the past 48 hour(s)). No results found.  Review of Systems  Constitutional: Negative.   HENT: Negative.   Eyes: Negative.   Respiratory: Negative.   Cardiovascular: Negative.   Gastrointestinal: Negative.   Genitourinary: Negative.   Musculoskeletal: Negative.   Skin: Negative.   Neurological:  Negative.   Endo/Heme/Allergies: Negative.   Psychiatric/Behavioral: Negative.     Blood pressure 138/84, pulse 71, temperature 98 F (36.7 C), temperature source Oral, resp. rate 20, SpO2 100.00%. Physical Exam  Constitutional: She is oriented to person, place, and time. She appears well-developed and well-nourished. No distress.  HENT:  Head: Normocephalic and atraumatic.  Right Ear: External ear normal.  Left Ear: External ear normal.  Nose: Nose normal.  Mouth/Throat: Oropharynx is clear and moist. No oropharyngeal exudate.  Eyes: Conjunctivae and EOM are normal. Pupils are equal, round, and reactive to light. Right eye exhibits no discharge. Left eye exhibits no discharge.  Neck: Normal range of motion. Neck supple. No tracheal deviation present. No thyromegaly present.  Cardiovascular: Normal rate, regular rhythm, normal heart sounds and intact distal pulses.  Exam reveals no gallop and no friction rub.   No murmur heard. Respiratory: Effort normal and breath sounds normal. No respiratory distress. She has no wheezes.  GI: Soft. Bowel sounds are normal. She exhibits no distension. There is no tenderness.  Musculoskeletal: Normal range of motion. She exhibits no edema and no tenderness.  Neurological: She is alert and oriented to person, place, and time. She has normal reflexes. No cranial nerve deficit. Coordination normal.  Skin: Skin is warm and dry. No rash noted. She is not diaphoretic. No erythema. No pallor.  Psychiatric: She has a normal  mood and affect. Her behavior is normal. Judgment and thought content normal.     Assessment/Plan L4-L5 grade 1 degenerative spondylolisthesis with stenosis. Plan L4-5 decompressive laminectomy and foraminotomies with posterior lumbar interbody fusion utilizing tangent interbody allograft wedge Telamon interbody peek cage local autograft. This will be coupled with posterior lateral arthrodesis utilizing nonsegmental pedicle screws patient.  Risks and benefits and explained. Patient wishes to proceed.  POOL,HENRY A 03/14/2014, 11:22 AM

## 2014-03-14 NOTE — Anesthesia Postprocedure Evaluation (Signed)
  Anesthesia Post-op Note  Patient: [name]  Procedure(s) Performed: Procedure(s): LUMBAR FOUR-FIVE POSTERIOR LUMBAR INTERBODY FUSION (N/A)  Patient Location: PACU  Anesthesia Type:General  Level of Consciousness: awake  Airway and Oxygen Therapy: Patient Spontanous Breathing  Post-op Pain: mild  Post-op Assessment: Post-op Vital signs reviewed  Post-op Vital Signs: Reviewed  Last Vitals:  Filed Vitals:   03/14/14 1555  BP: 115/78  Pulse: 55  Temp: 36.5 C  Resp: 18    Complications: No apparent anesthesia complications

## 2014-03-14 NOTE — Op Note (Signed)
Date of procedure: 03/14/2014  Date of dictation: Same  Service: Neurosurgery  Preoperative diagnosis: Grade 1 L4-5 degenerative spondylolisthesis with stenosis  Postoperative diagnosis: Same  Procedure Name: L4-5 decompressive laminectomy with bilateral L4 and L5 decompressive foraminotomies, more than would be required for simple interbody fusion alone.  L4-5 posterior lumbar inner body fusion utilizing tangent interbody allograft wedge, Telamon interbody peek cage, and local autograft.  L4-5 posterior lateral arthrodesis utilizing nonsegmental pedicle screw instrumentation and local autograft.  Surgeon:Henry A.Pool, M.D.  Asst. Surgeon: None  Anesthesia: General  Indication: 53 year old female with back and bilateral lower extremity pain consistent with L4 radiculopathy right greater than left. Workup demonstrates evidence of a grade 1 L4-5 degenerative spondylolisthesis with marked foraminal stenosis right greater than left. Patient presents now for decompression and fusion surgery in hopes of improving her symptoms.   Operative note: After induction anesthesia, patient positioned prone onto Wilson frame and appropriately padded. Lumbar region prepped and draped. Incision made overlying L4-5. Subperiosteal dissection performed bilaterally. Retractor placed. Fluoroscopy used. Levels confirmed. Decompressive laminectomy performed using Leksell rongeurs Kerrison rongeurs the high-speed drill to remove the entire lamina of L4 inferior facets of L4 bilaterally superior facets of L5 bilaterally and the superior rim of the L5 lamina. Ligament flavum was elevated and resected piecemeal fashion. Decompressive foraminotomies were performed on course exiting L4 and L5 or nerve roots bilaterally. All bone was cleaned in use and later autografting. Bilatera l discectomies were then performed at L4-5. The space and distracted with a 10 mm distractor less than patient's left side. Thecal sac and nerve  respect on the right side. The spaces and reamed and cut with 10 mm tangent is Mr. soft tissues removed and interspace. 10 x 22 mm Telamon cage packed with morselized autograft and packed into place and recessed approximately 1 mm from the posterior cortical margin of L4. Distractor was removed patient's left side. Thecal sac and nerve roots were protected on the left side. The space was reamed and then cut with 10 mm tangent is Mr. soft tissues removed and interspace. The spaces further curettage. Morselize autograft was packed into the interspace for later fusion. 10 x 26 millimeter tangent wedge was placed into the interspace and impacted flush with the vertebral body of L5. Pedicles of L4 and L5 were notified using surface landmarks and intraoperative fluoroscopy. Superficial bone overlying the pedicle was removed using a high-speed drill. Each pedicle was then probed using a pedicle awl. Each pedicle awl track was probed and found to be solidly within the bone. Each pedicle awl track was tapped with a 5.25 mm screw tap. Each her tap hole was probed and found to be solidly within bone. 5.75 x 40 mm Stryker pedicle screws were placed bilaterally at L4 and L5. Morselized autograft was packed posterior laterally after the transverse processes and lateral facets and then decorticated using high-speed drill. Short segment titanium rod was placed through the screw heads at L4 and L5. Locking catching and engaged with the construct under compression. Final images revealed good position the bone graft and hardware at the proper upper level with normal lamina spine. Wound is then irrigated one final time. Hemostasis was assured with bipolar cautery. Vancomycin powder was placed in the deep wound space. Wounds and close in a typical fashion. Steri-Strips and sterile dressing were applied. There were no apparent complications. Patient tolerated the procedure well and she returns to the recovery postop.

## 2014-03-14 NOTE — Anesthesia Preprocedure Evaluation (Addendum)
Anesthesia Evaluation  Patient identified by MRN, date of birth, ID band Patient awake    Reviewed: Allergy & Precautions, H&P , NPO status , Patient's Chart, lab work & pertinent test results  Airway Mallampati: II TM Distance: >3 FB Neck ROM: Full    Dental  (+) Teeth Intact, Dental Advisory Given   Pulmonary neg pulmonary ROS,  breath sounds clear to auscultation        Cardiovascular hypertension, Rhythm:Regular     Neuro/Psych    GI/Hepatic Neg liver ROS, GERD-  ,  Endo/Other  negative endocrine ROS  Renal/GU negative Renal ROS     Musculoskeletal   Abdominal (+)  Abdomen: soft. Bowel sounds: normal.  Peds  Hematology   Anesthesia Other Findings   Reproductive/Obstetrics                          Anesthesia Physical Anesthesia Plan  ASA: III  Anesthesia Plan: General   Post-op Pain Management:    Induction: Intravenous  Airway Management Planned: Oral ETT  Additional Equipment:   Intra-op Plan:   Post-operative Plan: Extubation in OR  Informed Consent: I have reviewed the patients History and Physical, chart, labs and discussed the procedure including the risks, benefits and alternatives for the proposed anesthesia with the patient or authorized representative who has indicated his/her understanding and acceptance.   Dental advisory given  Plan Discussed with: CRNA and Anesthesiologist  Anesthesia Plan Comments:         Anesthesia Quick Evaluation

## 2014-03-14 NOTE — Plan of Care (Signed)
Problem: Consults Goal: Diagnosis - Spinal Surgery Outcome: Completed/Met Date Met:  03/14/14 Thoraco/Lumbar Spine Fusion

## 2014-03-14 NOTE — Anesthesia Procedure Notes (Addendum)
Procedure Name: Intubation Date/Time: 03/14/2014 11:44 AM Performed by: Ellin GoodieWEAVER, HOLLY M Pre-anesthesia Checklist: Patient identified, Emergency Drugs available, Suction available, Patient being monitored and Timeout performed Patient Re-evaluated:Patient Re-evaluated prior to inductionOxygen Delivery Method: Circle system utilized Preoxygenation: Pre-oxygenation with 100% oxygen Intubation Type: IV induction and Cricoid Pressure applied Ventilation: Mask ventilation without difficulty Laryngoscope Size: Mac and 3 Grade View: Grade III Tube size: 7.5 mm Number of attempts: 2 Airway Equipment and Method: Stylet Placement Confirmation: positive ETCO2 and breath sounds checked- equal and bilateral Secured at: 22 cm Tube secured with: Tape Dental Injury: Teeth and Oropharynx as per pre-operative assessment  Difficulty Due To: Difficulty was unanticipated and Difficult Airway- due to anterior larynx Future Recommendations: Recommend- induction with short-acting agent, and alternative techniques readily available Comments: Easy atraumatic induction.  Grade III view and anterior larynx noted on first DL.    ETT passed easily , one breath given, esophageal intubation noted and ETT removed.  ETT passed easily into trachea on second DL with MAC 3 blade, grade II view noted on second DL after re-positioning height of bed.   Dr. Randa EvensEdwards verified placement.  Carlynn HeraldH Weaver, CRNA

## 2014-03-14 NOTE — Brief Op Note (Signed)
03/14/2014  2:25 PM  PATIENT:  Monia PouchElizabeth T Hankey  53 y.o. female  PRE-OPERATIVE DIAGNOSIS:  spondylolisthesis  POST-OPERATIVE DIAGNOSIS:  Spondylolisthesis  PROCEDURE:  Procedure(s): LUMBAR FOUR-FIVE POSTERIOR LUMBAR INTERBODY FUSION (N/A)  SURGEON:  Surgeon(s) and Role:    * Temple PaciniHenry A Namon Villarin, MD - Primary  PHYSICIAN ASSISTANT:   ASSISTANTS:    ANESTHESIA:   general  EBL:  Total I/O In: 2000 [I.V.:2000] Out: 505 [Urine:205; Blood:300]  BLOOD ADMINISTERED:none  DRAINS: none   LOCAL MEDICATIONS USED:  MARCAINE     SPECIMEN:  No Specimen  DISPOSITION OF SPECIMEN:  N/A  COUNTS:  YES  TOURNIQUET:  * No tourniquets in log *  DICTATION: .Dragon Dictation  PLAN OF CARE: Admit to inpatient   PATIENT DISPOSITION:  PACU - hemodynamically stable.   Delay start of Pharmacological VTE agent (>24hrs) due to surgical blood loss or risk of bleeding: yes

## 2014-03-14 NOTE — Progress Notes (Signed)
Utilization review completed.  

## 2014-03-14 NOTE — Transfer of Care (Signed)
Immediate Anesthesia Transfer of Care Note  Patient: [name]  Procedure(s) Performed: Procedure(s): LUMBAR FOUR-FIVE POSTERIOR LUMBAR INTERBODY FUSION (N/A)  Patient Location: PACU  Anesthesia Type:General  Level of Consciousness: awake, alert  and oriented  Airway & Oxygen Therapy: Patient connected to face mask oxygen  Post-op Assessment: Report given to PACU RN  Post vital signs: stable  Complications: No apparent anesthesia complications

## 2014-03-15 MED ORDER — OXYCODONE-ACETAMINOPHEN 5-325 MG PO TABS: 1.0000 | ORAL_TABLET | ORAL | Status: DC | PRN

## 2014-03-15 MED ORDER — DIAZEPAM 5 MG PO TABS: 5.0000 mg | ORAL_TABLET | Freq: Four times a day (QID) | ORAL | Status: DC | PRN

## 2014-03-15 NOTE — Discharge Instructions (Signed)

## 2014-03-15 NOTE — Progress Notes (Signed)
Pt. Alert and oriented,follows simple instructions, denies pain. Incision area without swelling, redness or S/S of infection. Voiding adequate clear yellow urine. Moving all extremities well and vitals stable and documented. Patient discharged home with family. Lumbar surgery notes instructions given to patient and family member for home safety and precautions. Pt. and family stated understanding of instructions given. Pain medication given to patient prior to discharged.

## 2014-03-15 NOTE — Progress Notes (Signed)
Occupational Therapy Evaluation Patient Details Name: [name] MRN: [id number] DOB: [birth year] Today's Date: 03/15/2014    History of Present Illness Pt admitted for L4-5 PLIF with recent left THA posterior   Clinical Impression   Completed all education regarding ADL. AE, compensatory techniques and use of DME. Rec for pt to purchase AE ket to assist with ADL. PT has hired help for her recovery time. No further OT needed at this time.    Follow Up Recommendations  No OT follow up;Supervision - Intermittent    Equipment Recommendations  3 in 1 bedside comode    Recommendations for Other Services       Precautions / Restrictions Precautions Precautions: Back;Fall Precaution Booklet Issued: Yes (comment) Required Braces or Orthoses: Spinal Brace Spinal Brace: Lumbar corset;Applied in sitting position      Mobility Bed Mobility Overal bed mobility: Modified Independent Bed Mobility: Rolling;Sidelying to Sit Rolling: Supervision Sidelying to sit: Supervision       General bed mobility comments: with use of bedrail  Transfers Overall transfer level: Needs assistance   Transfers: Sit to/from Stand;Stand Pivot Transfers Sit to Stand: Supervision Stand pivot transfers: Supervision       General transfer comment: vc for precautions    Balance                                            ADL Overall ADL's : Needs assistance/impaired     Grooming: Set up   Upper Body Bathing: Set up;Sitting   Lower Body Bathing: Moderate assistance;Sit to/from stand   Upper Body Dressing : Set up;Sitting   Lower Body Dressing: Moderate assistance;Sit to/from stand   Toilet Transfer: Supervision/safety   Toileting- ArchitectClothing Manipulation and Hygiene: Moderate assistance       Functional mobility during ADLs: Supervision/safety;Cueing for safety General ADL Comments: Educated pt on AE and compensatory techniques for ADL. Discussed availability  of AE     Vision                     Perception     Praxis      Pertinent Vitals/Pain No c/o pain     Hand Dominance     Extremity/Trunk Assessment Upper Extremity Assessment Upper Extremity Assessment: Overall WFL for tasks assessed   Lower Extremity Assessment Lower Extremity Assessment: Defer to PT evaluation LLE Deficits / Details: decreased hip flexion and strength   Cervical / Trunk Assessment Cervical / Trunk Assessment: Normal   Communication Communication Communication: No difficulties   Cognition Arousal/Alertness: Awake/alert Behavior During Therapy: WFL for tasks assessed/performed Overall Cognitive Status: Within Functional Limits for tasks assessed                     General Comments       Exercises       Shoulder Instructions      Home Living Family/patient expects to be discharged to:: Private residence Living Arrangements: Spouse/significant other Available Help at Discharge: Family;Available PRN/intermittently Type of Home: House Home Access: Stairs to enter Entergy CorporationEntrance Stairs-Number of Steps: 1 Entrance Stairs-Rails: None Home Layout: Two level;Bed/bath upstairs Alternate Level Stairs-Number of Steps: 13 Alternate Level Stairs-Rails: Right Bathroom Shower/Tub: Producer, television/film/videoWalk-in shower   Bathroom Toilet: Standard Bathroom Accessibility: Yes How Accessible: Accessible via walker Home Equipment: Walker - 2 wheels          Prior Functioning/Environment  Level of Independence: Independent        Comments: Pt was recovering from her THA still needing assist for lower body dressing and unable to perform reciprocal stairs but otherwise performing her own ADLs    OT Diagnosis:     OT Problem List:     OT Treatment/Interventions:      OT Goals(Current goals can be found in the care plan section) Acute Rehab OT Goals Patient Stated Goal: return to work OT Goal Formulation:  (eval only)  OT Frequency:     Barriers to D/C:             Co-evaluation              End of Session Equipment Utilized During Treatment: Back brace;Rolling walker Nurse Communication: Mobility status  Activity Tolerance: Patient tolerated treatment well Patient left: Other (comment) (in bathroom)   Time: 1002-1029 OT Time Calculation (min): 27 min Charges:  OT General Charges $OT Visit: 1 Procedure OT Evaluation $Initial OT Evaluation Tier I: 1 Procedure OT Treatments $Self Care/Home Management : 23-37 mins G-Codes:    WARD,HILLARY 03/15/2014, 10:34 AM   Luisa DagoHilary Ward, OTR/L  [phone] 03/15/2014

## 2014-03-15 NOTE — Discharge Summary (Signed)
Physician Discharge Summary  Patient ID: [name] MRN: [id number] DOB/AGE: Aug 02, 1961 53 y.o.  Admit date: 03/14/2014 Discharge date: 03/15/2014  Admission Diagnoses:  Discharge Diagnoses:  Principal Problem:   Degenerative spondylolisthesis   Discharged Condition: good  Hospital Course: Patient admitted to the hospital where she underwent an uncomplicated L4-5 decompression and fusion. Postoperative she is doing well. Mobilizing without difficulty. Pain controlled. No lower extremity pain. Ready for discharge home.  Consults:   Significant Diagnostic Studies:   Treatments:   Discharge Exam: Blood pressure 109/72, pulse 83, temperature 98.2 F (36.8 C), temperature source Oral, resp. rate 18, SpO2 98.00%. Awake and alert. Oriented and appropriate. Motor and sensory function stable. Wound clean and dry. Chest and abdomen benign.  Disposition: Final discharge disposition not confirmed     Medication List         diazepam 5 MG tablet  Commonly known as:  VALIUM  Take 1-2 tablets (5-10 mg total) by mouth every 6 (six) hours as needed for muscle spasms.     gabapentin 600 MG tablet  Commonly known as:  NEURONTIN  Take 600 mg by mouth at bedtime.     losartan 25 MG tablet  Commonly known as:  COZAAR  Take 25 mg by mouth daily.     meloxicam 7.5 MG tablet  Commonly known as:  MOBIC  Take 7.5 mg by mouth daily.     omeprazole 40 MG capsule  Commonly known as:  PRILOSEC  Take 40 mg by mouth daily.     oxyCODONE-acetaminophen 5-325 MG per tablet  Commonly known as:  PERCOCET/ROXICET  Take 1-2 tablets by mouth every 4 (four) hours as needed for moderate pain.     traMADol 50 MG tablet  Commonly known as:  ULTRAM  Take 50 mg by mouth every 6 (six) hours as needed for moderate pain.         Signed: POOL,[name] 03/15/2014, 9:36 AM

## 2014-03-15 NOTE — Evaluation (Signed)
Physical Therapy Evaluation Patient Details Name: [name] T Brull MRN: [id number] DOB: 02-May-1961 Today's Date: 03/15/2014   History of Present Illness  Pt admitted for L4-5 PLIF with recent left THA posterior  Clinical Impression  Pt very pleasant and educated for precautions, brace wear, transfers, gait and home management with pt able to verbalize understanding. Pt with decreased function pre op secondary to recovering from left THA. Pt will benefit from acute therapy to maximize mobility, transfers and function to decrease burden of care at DC. Will continue to follow.     Follow Up Recommendations No PT follow up    Equipment Recommendations  3in1 (PT)    Recommendations for Other Services       Precautions / Restrictions Precautions Precautions: Back;Fall Precaution Booklet Issued: Yes (comment) Required Braces or Orthoses: Spinal Brace Spinal Brace: Lumbar corset;Applied in sitting position      Mobility  Bed Mobility Overal bed mobility: Needs Assistance Bed Mobility: Rolling;Sidelying to Sit Rolling: Supervision Sidelying to sit: Supervision       General bed mobility comments: cues for sequence and safety without rail  Transfers Overall transfer level: Needs assistance   Transfers: Sit to/from Stand Sit to Stand: Supervision         General transfer comment: cues for hand placement and posture  Ambulation/Gait Ambulation/Gait assistance: Supervision Ambulation Distance (Feet): 400 Feet Assistive device: Rolling walker (2 wheeled) Gait Pattern/deviations: Step-through pattern;Decreased stride length   Gait velocity interpretation: at or above normal speed for age/gender General Gait Details: cues for posture  Stairs Stairs: Yes Stairs assistance: Modified independent (Device/Increase time) Stair Management: One rail Right;Step to pattern;Forwards Number of Stairs: 11    Wheelchair Mobility    Modified Rankin (Stroke Patients Only)        Balance                                             Pertinent Vitals/Pain 0/10 pain    Home Living Family/patient expects to be discharged to:: Private residence Living Arrangements: Spouse/significant other Available Help at Discharge: Family;Available PRN/intermittently Type of Home: House Home Access: Stairs to enter Entrance Stairs-Rails: None Entrance Stairs-Number of Steps: 1 Home Layout: Two level;Bed/bath upstairs Home Equipment: Walker - 2 wheels      Prior Function Level of Independence: Independent         Comments: Pt was recovering from her THA still needing assist for lower body dressing and unable to perform reciprocal stairs but otherwise performing her own ADLs     Hand Dominance        Extremity/Trunk Assessment   Upper Extremity Assessment: Overall WFL for tasks assessed           Lower Extremity Assessment: LLE deficits/detail   LLE Deficits / Details: decreased hip flexion and strength  Cervical / Trunk Assessment: Normal  Communication   Communication: No difficulties  Cognition Arousal/Alertness: Awake/alert Behavior During Therapy: WFL for tasks assessed/performed Overall Cognitive Status: Within Functional Limits for tasks assessed                      General Comments      Exercises        Assessment/Plan    PT Assessment Patient needs continued PT services  PT Diagnosis Difficulty walking   PT Problem List Decreased activity tolerance;Decreased mobility;Decreased knowledge of use of DME  PT Treatment Interventions Gait training;DME instruction;Therapeutic activities;Patient/family education   PT Goals (Current goals can be found in the Care Plan section) Acute Rehab PT Goals Patient Stated Goal: return to work PT Goal Formulation: With patient Time For Goal Achievement: 03/22/14 Potential to Achieve Goals: Good    Frequency Min 5X/week   Barriers to discharge Decreased caregiver  support      Co-evaluation               End of Session Equipment Utilized During Treatment: Back brace Activity Tolerance: Patient tolerated treatment well Patient left: in chair;with call bell/phone within reach Nurse Communication: Mobility status         Time: [id number] PT Time Calculation (min): 30 min   Charges:   PT Evaluation $Initial PT Evaluation Tier I: 1 Procedure PT Treatments $Gait Training: 8-22 mins $Therapeutic Activity: 8-22 mins   PT G Codes:          Delorse Lekabor, Aviv Lengacher Beth 03/15/2014, 8:52 AM Delaney MeigsMaija Tabor Conner Neiss, PT [phone]

## 2014-03-16 MED FILL — Sodium Chloride IV Soln 0.9%: INTRAVENOUS | Qty: 1000 | Status: AC

## 2014-03-16 MED FILL — Heparin Sodium (Porcine) Inj 1000 Unit/ML: INTRAMUSCULAR | Qty: 30 | Status: AC

## 2014-05-16 ENCOUNTER — Ambulatory Visit: Payer: Self-pay | Admitting: Internal Medicine

## 2014-05-16 LAB — IRON AND TIBC
IRON BIND. CAP.(TOTAL): 391 ug/dL (ref 250–450)
IRON: 78 ug/dL (ref 50–170)
Iron Saturation: 20 %
Unbound Iron-Bind.Cap.: 313 ug/dL

## 2014-05-16 LAB — FERRITIN: FERRITIN (ARMC): 16 ng/mL (ref 8–388)

## 2014-05-16 LAB — CANCER CENTER HEMOGLOBIN: HGB: 11.8 g/dL — ABNORMAL LOW (ref 12.0–16.0)

## 2014-05-17 LAB — CBC CANCER CENTER
Basophil #: 0 x10 3/mm (ref 0.0–0.1)
Basophil %: 0.4 %
EOS ABS: 0.1 x10 3/mm (ref 0.0–0.7)
EOS PCT: 2.4 %
HCT: 38.2 % (ref 35.0–47.0)
HGB: 11.9 g/dL — AB (ref 12.0–16.0)
LYMPHS PCT: 27.6 %
Lymphocyte #: 1.4 x10 3/mm (ref 1.0–3.6)
MCH: 26.7 pg (ref 26.0–34.0)
MCHC: 31 g/dL — AB (ref 32.0–36.0)
MCV: 86 fL (ref 80–100)
MONO ABS: 0.4 x10 3/mm (ref 0.2–0.9)
MONOS PCT: 7.6 %
NEUTROS ABS: 3.1 x10 3/mm (ref 1.4–6.5)
NEUTROS PCT: 62 %
PLATELETS: 238 x10 3/mm (ref 150–440)
RBC: 4.45 10*6/uL (ref 3.80–5.20)
RDW: 16.3 % — AB (ref 11.5–14.5)
WBC: 5 x10 3/mm (ref 3.6–11.0)

## 2014-05-24 ENCOUNTER — Ambulatory Visit: Payer: Self-pay | Admitting: Obstetrics and Gynecology

## 2014-05-24 ENCOUNTER — Ambulatory Visit: Payer: Self-pay | Admitting: Internal Medicine

## 2014-05-27 LAB — CANCER CENTER HEMOGLOBIN: HGB: 13.1 g/dL (ref 12.0–16.0)

## 2014-06-23 ENCOUNTER — Ambulatory Visit: Payer: Self-pay | Admitting: Internal Medicine

## 2014-06-28 ENCOUNTER — Ambulatory Visit: Payer: Self-pay | Admitting: General Practice

## 2014-06-28 LAB — CREATININE, SERUM
CREATININE: 0.96 mg/dL (ref 0.60–1.30)
EGFR (African American): >60
EGFR (Non-African Amer.): >60

## 2014-07-08 LAB — CANCER CENTER HEMOGLOBIN: HGB: 12.3 g/dL (ref 12.0–16.0)

## 2014-07-08 LAB — FERRITIN: FERRITIN (ARMC): 10 ng/mL (ref 8–388)

## 2014-07-24 ENCOUNTER — Ambulatory Visit: Payer: Self-pay | Admitting: Internal Medicine

## 2014-09-28 ENCOUNTER — Ambulatory Visit: Payer: Self-pay | Admitting: Internal Medicine

## 2014-09-28 LAB — FERRITIN: Ferritin (ARMC): 9 ng/mL (ref 8–388)

## 2014-09-28 LAB — CANCER CENTER HEMOGLOBIN: HGB: 12.8 g/dL (ref 12.0–16.0)

## 2014-10-24 ENCOUNTER — Ambulatory Visit: Payer: Self-pay | Admitting: Internal Medicine

## 2014-10-28 LAB — FERRITIN: Ferritin (ARMC): 20 ng/mL (ref 8–388)

## 2014-10-28 LAB — CANCER CENTER HEMOGLOBIN: HGB: 12.6 g/dL (ref 12.0–16.0)

## 2014-11-22 ENCOUNTER — Ambulatory Visit
Admit: 2014-11-22 | Disposition: A | Discharge status: Home / Self Care | Payer: Self-pay | Attending: Internal Medicine | Admitting: Internal Medicine

## 2014-12-21 ENCOUNTER — Ambulatory Visit: Admit: 2014-12-21 | Disposition: A | Discharge status: Home / Self Care | Payer: Self-pay | Admitting: Family Medicine

## 2014-12-23 ENCOUNTER — Ambulatory Visit
Admit: 2014-12-23 | Disposition: A | Discharge status: Home / Self Care | Payer: Self-pay | Attending: Internal Medicine | Admitting: Internal Medicine

## 2015-01-14 NOTE — Op Note (Signed)
PATIENT NAME:  [name], [name] MR#:  [id number] DATE OF BIRTH:  1961/03/03  DATE OF PROCEDURE:  01/17/2014  PREOPERATIVE DIAGNOSIS: Degenerative arthrosis of the left hip.   POSTOPERATIVE DIAGNOSIS: Degenerative arthrosis of the left hip.   PROCEDURE PERFORMED: Left total hip arthroplasty.   SURGEON: Illene LabradorJames P. Hooten, MD   ASSISTANT: Hamilton CapriJohn Wolfe, PA (required to maintain retraction throughout the procedure).   ANESTHESIA: Spinal and general.   ESTIMATED BLOOD LOSS: 500 mL.   FLUIDS REPLACED: 2300 mL of crystalloid.   DRAINS: Two medium drains to Hemovac reservoir.   IMPLANTS UTILIZED: DePuy 15 mm small stature AML femoral stem, 54 mm outer diameter Pinnacle Gription sector acetabular component, 6.5 mm x 20 mm Pinnacle cancellous bone screw, +4 mm neutral Pinnacle Marathon polyethylene liner, and a 36 mm M-SPEC femoral head with a +5 mm neck length.   INDICATIONS FOR SURGERY: The patient is a 54 year old obese female who has been seen for complaints of progressive left hip and groin pain. X-rays demonstrated severe degenerative changes. After discussion of the risks and benefits of surgical intervention, the patient expressed understanding of the risks and benefits and agreed with plans for surgical intervention.   PROCEDURE IN DETAIL: The patient was brought to the operating room and, after adequate spinal anesthesia was achieved, the patient was placed in a right lateral decubitus position. Axillary roll was placed and all bony prominences were well padded. The patient's left hip and leg were cleaned and prepped with alcohol and DuraPrep and draped in the usual sterile fashion. A "timeout" was performed as per usual procedure. The patient had an episode of emesis while in lateral decubitus position with the possibility of aspiration. The anesthesiologist subsequently intubated the patient and the remainder of the case proceeded under combination of both spinal and general anesthesia.  The patient's O2 saturation remained stable throughout the procedure.   A lateral curvilinear incision was made gently curving towards the posterior superior iliac spine. IT band was incised in line with the skin incision and fibers of the gluteus maximus were split in line. The piriformis tendon was identified, skeletonized, and incised at its insertion of the proximal femur and reflected posteriorly. In a similar fashion, short external rotators were incised and reflected posteriorly. A T-type posterior capsulotomy was performed. The femoral head was then dislocated posteriorly. Inspection of the head demonstrated severe degenerative changes and full thickness loss of articular cartilage. The femoral neck cut was performed using an oscillating saw. The anterior capsule was elevated off the femoral neck. Inspection of the acetabulum also demonstrated severe degenerative changes. The remnant of the labrum was excised using electrocautery. The acetabulum was then reamed in a sequential fashion up to a 53 mm diameter. Good punctate bleeding bone was encountered. A 54 mm Pinnacle Gription sector cup was positioned and impacted into place. Good scratch fit was appreciated. It was elected to augment fixation with a 6.5 mm x 20 mm cancellous screw. A +4 mm neutral polyethylene trial was inserted and attention was directed to the proximal femur. Pilot hole for reaming of the proximal femoral canal was created using a high-speed bur. Proximal femoral canal was reamed in a sequential fashion up to a 14.5 mm diameter. Proximal femur was prepared using a 50 mm aggressive side-biting reamer. Serial broaches were inserted up to a 15 mm small stature broach. The calcar region was planed accordingly and trial reduction was performed using a 36 mm head with a +1.5 mm neck length. The +4 neutral  polyethylene trial was replaced with a +4 mm 10 degree offset with the high side placed at the 4 o'clock position. Good stability was  appreciated posteriorly. Trial components were removed. The acetabular shell was irrigated and suctioned dry. A +4 mm 10 degree Pinnacle Marathon polyethylene liner was positioned with the high side at approximately 4 o'clock position and then impacted into place. A 15 mm small stature AML femoral component was initially positioned. Greater than 7 cm of scratch fit was encountered. It was elected to ream line the line with a 50 mm reamer. The AML femoral component was repositioned and impacted into place. Excellent scratch fit was appreciated. Trial reduction was again performed with a 36 mm head with a +1.5 mm neck length. It was felt that the neck was impinging on the lip of the polyethylene requiring some liftoff with testing of the anterior stability. The +4 mm 10 degree polyethylene liner was disengaged from the acetabular shell using a 6.5 mm cancellous screw. Fixation of the cup was again assessed and felt to be excellent. A +4 mm neutral trial was inserted with a 36 mm hip ball with a +5 mm neck length. No impingement was appreciated and good stability was appreciated both anteriorly and posteriorly. Good equalization of limb lengths was appreciated as measured distally with the knees flexed to 90 degrees. Trial components were removed. A +4 mm neutral Pinnacle Marathon polyethylene liner was positioned and impacted into place. The [facility] taper was cleaned and dried and a 36 mm M-SPEC femoral head with a +5 mm neck length was placed on the trunnion and impacted into place. The hip was reduced and placed through a range of motion. Excellent stability was appreciated both anteriorly and posteriorly without evidence of impingement of the neck on the rim of the acetabular component. Good equalization of limb lengths was appreciated. The wound was irrigated with copious amounts of normal saline with antibiotic solution using pulsatile lavage and then suctioned dry. Good hemostasis was appreciated. The posterior  capsulotomy was repaired using #5 Ethibond. The piriformis tendon was reapproximated on the undersurface of the gluteus medius tendon using #5 Ethibond. Two medium drains were placed in the wound bed and brought out through a separate stab incision to be attached to a Hemovac reservoir. The IT band was repaired using interrupted sutures of #1 Vicryl. The subcutaneous tissue was approximated in layers using first #0 Vicryl followed by #2-0 Vicryl. Skin was closed with skin staples. A sterile dressing was applied.   The patient tolerated the procedure well. She was transported to the recovery room in stable condition.   ____________________________ Illene Labrador. Angie Fava., MD jph:sb D: 01/18/2014 06:35:33 ET T: 01/18/2014 08:23:23 ET JOB#: [id number]  cc: Illene Labrador. Angie Fava., MD, <Dictator> JAMES P Angie Fava MD ELECTRONICALLY SIGNED 01/20/2014 23:05

## 2015-01-14 NOTE — Discharge Summary (Signed)
PATIENT NAME:  [name], [name] MR#:  [id number] DATE OF BIRTH:  December 07, 1960  DATE OF ADMISSION:  01/17/2014 DATE OF DISCHARGE:  01/20/2014  ADMITTING DIAGNOSIS: Degenerative arthrosis of the left hip.   DISCHARGE DIAGNOSIS: Degenerative arthrosis of the left hip.  PAST MEDICAL HISTORY: The patient is a 54 year old who has been followed at [facility] for progression of left hip and groin pain. She was also experiencing some lower back and pain radiating down the leg, but the greatest pain to the groin region. She had noticed progressive loss in her left hip range of motion. The patient had not recalled any specific trauma or aggravating event. She had not seen any significant improvement in her condition despite the use of anti-inflammatory medications as well as pain medicines. At the time of surgery, she had gone to using a quad cane. She reports that the pain had increased to the point that it was significantly interfering with her activities of daily living. She was noted to have increased weight-bearing pain. The patient has undergone a gastric bypass in the past. X-rays taken in [facility]ics showed significant narrowing of the cartilage space with bone-on-bone articulation noted. There was some subchondral sclerosis noted. After discussion of the risks and benefits of surgical intervention, the patient expressed her understanding of the risks and benefits and agreed with plans for surgical intervention.   PROCEDURE: Left total hip arthroplasty.   ANESTHESIA: Spinal and general.   IMPLANTS UTILIZED: DePuy 15 mm small stature AML femoral stem, 54 mm outer diameter Pinnacle Gription Sector acetabular component, 6.5 mm x 20 mm Pinnacle cancellous bone screw, +4 mm neutral Pinnacle Marathon polyethylene liner, and a 36 mm M-SPEC femoral head with a +5 mm neck length.   HOSPITAL COURSE:  After positioning of the patient, the patient was noted to have a bowel movement x3 and on  prior to her incision the patient was noted to have emesis. She subsequently went from a spinal to a general. There were no complications otherwise. Chest x-rays taken in the PAC-U did not reveal any abnormalities and lungs were felt to be clear. While in the PAC-U the patient once again had another bowel movement and emesis. The patient while in the PACU was stabilized and then she was transferred to the orthopedic floor.  The patient began receiving anticoagulation therapy of Lovenox 30 mg subcu every 12 hours per anesthesia and pharmacy protocol. She was fitted with TED stockings bilaterally. These were allowed to be removed 1 hour per 8 hour shift. She was fitted with AVI compression foot pumps bilaterally set at 80 mmHg. Her calves have been nontender. There has been no evidence of DVTs. Negative Homans sign. Heels were elevated off the bed using rolled towels.   The patient has denied any shortness of breath, but on day 3 she did complain of some posterior lower thoracic lumbar pain. Lungs were clear to auscultation. No tenderness was noted to palpation. Subsequently, a chest x-ray was obtained. This did not reveal any evidence of any pneumonia. Her vital signs otherwise have been unremarkable. She has been afebrile. Hemodynamically she was stable and no transfusions were given.   Physical therapy was initiated on day 1 for gait training and transfers. She has done very well. Upon being discharged was ambulating greater than 300 feet. She was able to go up and down 16 steps. She was independent with bed to chair transfers. No complications were encountered.   The patient's IV, Foley and Hemovac were  DC'd on day 2 along with a dressing change. The wound was free of any drainage or any signs of infection.   DISPOSITION: The patient is being discharged to home in improved stable condition.   DISCHARGE INSTRUCTIONS: She will continue weight-bearing as tolerated. Continue with home health PT for  exercising and range of motion. Weight bear as tolerated. Continue with TED stockings bilaterally. These are allowed to be removed at night but are to be worn during the day. She was gone over the posterior hip precautions. Elevate the lower extremity. Recommend that she continue with incentive spirometer q. 1 hour while awake. Encouraged her to continue cough and deep breathing q. 2 hours while awake. She is placed on a regular diet.  She was instructed in wound care. She is not to get the dressing wet until the staples are removed on 05/11 at which time they will apply benzoin and Steri-Strips. [facility] at 9:00 a.m. She is to call the clinic sooner if any temperatures of 101.5 or greater or excessive bleeding.   DRUG ALLERGIES: VICODIN.   DISCHARGE MEDICATIONS: The patient is discharged to home on Lovenox 40 mg subcu q. day for 14 days then discontinue and begin taking one 81 mg enteric-coated aspirin unless contraindicated, also a prescription for Nucynta 50 to 100 mg q. 4 to 6 hours p.r.n. for pain and Travenol 50 to 100 mg q. 4 to 6 hours p.r.n. for pain. She may resume her regular medication that she was on prior to admission.   PAST MEDICAL HISTORY: 1.  Anemia. 2.  Hypertension. 3.  Gastroesophageal reflux disease.  4.  Migraines.  5.  Obesity. 6.  Gastric bypass. ____________________________ Van ClinesJon Yanely Mast, PA jrw:sb D: 01/20/2014 07:48:38 ET T: 01/20/2014 08:12:56 ET JOB#: [id number]  cc: Van ClinesJon Tymber Stallings, PA, <Dictator> Wilmetta Speiser PA ELECTRONICALLY SIGNED 01/25/2014 21:40

## 2015-01-20 LAB — CANCER CENTER HEMOGLOBIN: HGB: 12.9 g/dL (ref 12.0–16.0)

## 2015-01-20 LAB — FERRITIN: Ferritin (ARMC): 16 ng/mL

## 2015-01-27 ENCOUNTER — Telehealth: Payer: Self-pay | Admitting: *Deleted

## 2015-01-27 NOTE — Telephone Encounter (Signed)
Asking for results and if she needs to come in for venofer infusion

## 2015-02-02 ENCOUNTER — Ambulatory Visit: Payer: BC Managed Care – PPO

## 2015-02-03 ENCOUNTER — Inpatient Hospital Stay: Payer: BC Managed Care – PPO | Attending: Internal Medicine

## 2015-02-03 VITALS — BP 121/83 | HR 69 | Temp 96.4°F | Resp 18

## 2015-02-03 DIAGNOSIS — I10 Essential (primary) hypertension: Secondary | ICD-10-CM | POA: Diagnosis not present

## 2015-02-03 DIAGNOSIS — Z79899 Other long term (current) drug therapy: Secondary | ICD-10-CM | POA: Insufficient documentation

## 2015-02-03 DIAGNOSIS — D509 Iron deficiency anemia, unspecified: Secondary | ICD-10-CM

## 2015-02-03 DIAGNOSIS — Z9884 Bariatric surgery status: Secondary | ICD-10-CM | POA: Diagnosis not present

## 2015-02-03 MED ORDER — SODIUM CHLORIDE 0.9 % IV SOLN
100.0000 mg | Freq: Once | INTRAVENOUS | Status: AC
  Administered 2015-02-03: 100 mg via INTRAVENOUS
  Filled 2015-02-03: qty 5

## 2015-02-03 MED ORDER — SODIUM CHLORIDE 0.9 % IV SOLN
INTRAVENOUS | Status: DC
  Administered 2015-02-03: 11:00:00 via INTRAVENOUS
  Filled 2015-02-03: qty 1000

## 2015-02-03 MED ORDER — SODIUM CHLORIDE 0.9 % IV SOLN
100.0000 mg | Freq: Once | INTRAVENOUS | Status: DC
  Filled 2015-02-03: qty 5

## 2015-02-03 NOTE — Addendum Note (Signed)
Addended by: Consepcion HearingUSHER, Jezlyn Westerfield H on: 02/03/2015 01:15 PM   Modules accepted: Orders, SmartSet

## 2015-02-03 NOTE — Progress Notes (Signed)
This encounter was created in error - please disregard.

## 2015-02-03 NOTE — Addendum Note (Signed)
Addended byDoreene Nest: Jaren Kearn J on: 02/03/2015 11:39 AM   Modules accepted: Orders

## 2015-02-06 ENCOUNTER — Inpatient Hospital Stay: Payer: BC Managed Care – PPO

## 2015-02-06 ENCOUNTER — Telehealth: Payer: Self-pay | Admitting: *Deleted

## 2015-02-06 ENCOUNTER — Other Ambulatory Visit: Payer: Self-pay | Admitting: Internal Medicine

## 2015-02-06 VITALS — BP 129/87 | HR 57 | Temp 96.9°F | Resp 18

## 2015-02-06 DIAGNOSIS — D509 Iron deficiency anemia, unspecified: Secondary | ICD-10-CM | POA: Diagnosis not present

## 2015-02-06 MED ORDER — SODIUM CHLORIDE 0.9 % IV SOLN
Freq: Once | INTRAVENOUS | Status: AC
  Administered 2015-02-06: 11:00:00 via INTRAVENOUS
  Filled 2015-02-06: qty 250

## 2015-02-06 MED ORDER — SODIUM CHLORIDE 0.9 % IV SOLN
100.0000 mg | Freq: Once | INTRAVENOUS | Status: AC
  Administered 2015-02-06: 100 mg via INTRAVENOUS
  Filled 2015-02-06: qty 5

## 2015-02-06 NOTE — Telephone Encounter (Addendum)
States she had an iron infusion last week and is supposed to get another this week, but no one gave her an appt for it. Asking if she can come in today, she has RLS adn is not sleeping and it is affecting her work... V.O. Dr. Lorre NickGittin- Pt can come in today for Venofer 100mg  injection... I called Ms. Ward ChattersGallimore and informed her [place] at 10:45... Pt was agreeable.[name]..[name]

## 2015-03-01 ENCOUNTER — Encounter: Payer: BC Managed Care – PPO | Admitting: Family Medicine

## 2015-03-01 ENCOUNTER — Other Ambulatory Visit: Payer: BC Managed Care – PPO

## 2015-03-01 ENCOUNTER — Ambulatory Visit: Payer: BC Managed Care – PPO

## 2015-03-17 ENCOUNTER — Inpatient Hospital Stay: Payer: BC Managed Care – PPO

## 2015-03-17 ENCOUNTER — Inpatient Hospital Stay: Payer: BC Managed Care – PPO | Admitting: Family Medicine

## 2015-03-17 ENCOUNTER — Inpatient Hospital Stay: Payer: BC Managed Care – PPO | Attending: Family Medicine

## 2015-03-17 ENCOUNTER — Encounter: Payer: Self-pay | Admitting: Family Medicine

## 2015-03-17 VITALS — BP 121/86 | HR 77 | Temp 97.9°F | Wt 226.0 lb

## 2015-03-17 DIAGNOSIS — D509 Iron deficiency anemia, unspecified: Secondary | ICD-10-CM | POA: Diagnosis present

## 2015-03-17 DIAGNOSIS — E538 Deficiency of other specified B group vitamins: Secondary | ICD-10-CM | POA: Diagnosis not present

## 2015-03-17 HISTORY — DX: Iron deficiency anemia, unspecified: D50.9

## 2015-03-17 LAB — HEMOGLOBIN: HEMOGLOBIN: 12.8 g/dL (ref 12.0–16.0)

## 2015-03-17 LAB — FERRITIN: Ferritin: 40 ng/mL (ref 11–307)

## 2015-03-21 ENCOUNTER — Telehealth: Payer: Self-pay

## 2015-03-30 NOTE — Progress Notes (Signed)
This encounter was created in error - please disregard.

## 2015-04-03 ENCOUNTER — Other Ambulatory Visit: Payer: Self-pay | Admitting: Family Medicine

## 2015-04-03 NOTE — Progress Notes (Unsigned)
[facility]  Telephone:([phone]) [phone]  [phone]     [name] DOB: [birth year]  MR#: [id number]  AOZ#:[id number]  Patient Care Team: [name], MD as PCP - General (Family Medicine)   Patient was seen on 03/17/2015.  CHIEF COMPLAINT:  Patient is here for continued follow-up regarding iron deficiency anemia as well as B12 deficiency. History of gastric bypass surgery in 1998.  INTERVAL HISTORY: Patient returns for continued follow-up in regards to IDA and B12 deficiency. She reports overall feeling well. She has some mild fatigue, no shortness of breath or chest pain reported. Her last infusion of Venofer was in March 2016. She is status post gastric bypass surgery in 1998.  REVIEW OF SYSTEMS:   Review of Systems  Constitutional: Positive for malaise/fatigue. Negative for fever, chills, weight loss and diaphoresis.       Mild fatigue  HENT: Negative for congestion, ear discharge, ear pain, hearing loss, nosebleeds, sore throat and tinnitus.   Eyes: Negative for blurred vision, double vision, photophobia, pain, discharge and redness.  Respiratory: Negative for cough, hemoptysis, sputum production, shortness of breath, wheezing and stridor.   Cardiovascular: Negative for chest pain, palpitations, orthopnea, claudication, leg swelling and PND.  Gastrointestinal: Negative for heartburn, nausea, vomiting, abdominal pain, diarrhea, constipation, blood in stool and melena.  Genitourinary: Negative.   Musculoskeletal: Negative.   Skin: Negative.   Neurological: Negative for dizziness, tingling, focal weakness, seizures, weakness and headaches.  Endo/Heme/Allergies: Does not bruise/bleed easily.  Psychiatric/Behavioral: Negative for depression. The patient is not nervous/anxious and does not have insomnia.     As per HPI. Otherwise, a complete review of systems is negatve.  ONCOLOGY HISTORY:  No history exists.    PAST MEDICAL HISTORY: Past  Medical History  Diagnosis Date  . Complication of anesthesia     loss control of bowels after block  . Hypertension   . GERD (gastroesophageal reflux disease)   . Arthritis   . Anemia   . IDA (iron deficiency anemia) 03/17/2015  . IDA (iron deficiency anemia) 03/17/2015    PAST SURGICAL HISTORY: Past Surgical History  Procedure Laterality Date  . Abdominal hysterectomy  2008  . Tonsillectomy    . Appendectomy    . Joint replacement Left 12/2013    hip replacement at [facility]  . Cholecystectomy    . Colonoscopy      FAMILY HISTORY No family history on file.  GYNECOLOGIC HISTORY:  No LMP recorded. Patient has had a hysterectomy.     ADVANCED DIRECTIVES:    HEALTH MAINTENANCE: History  Substance Use Topics  . Smoking status: Never Smoker   . Smokeless tobacco: Never Used  . Alcohol Use: No     Colonoscopy:  PAP:  Bone density:  Lipid panel:  No Known Allergies  Current Outpatient Prescriptions  Medication Sig Dispense Refill  . diazepam (VALIUM) 5 MG tablet Take 1-2 tablets (5-10 mg total) by mouth every 6 (six) hours as needed for muscle spasms. 60 tablet 0  . gabapentin (NEURONTIN) 600 MG tablet Take 600 mg by mouth at bedtime.    [name] losartan (COZAAR) 25 MG tablet Take 25 mg by mouth daily.    . meloxicam (MOBIC) 7.5 MG tablet Take 7.5 mg by mouth daily.    [name] omeprazole (PRILOSEC) 40 MG capsule Take 40 mg by mouth daily.    [name] oxyCODONE-acetaminophen (PERCOCET/ROXICET) 5-325 MG per tablet Take 1-2 tablets by mouth every 4 (four) hours as needed for moderate pain. (Patient not taking:  Reported on 03/17/2015) 80 tablet 0  . sertraline (ZOLOFT) 50 MG tablet 1/2 po qhs,if tolerates,increase to 1 po qhs after 1 week    . traMADol (ULTRAM) 50 MG tablet Take 50 mg by mouth every 6 (six) hours as needed for moderate pain.    . Vitamin D, Ergocalciferol, (DRISDOL) 50000 UNITS CAPS capsule TAKE 1 CAPSULE (50,000 UNITS TOTAL) BY MOUTH ONCE A WEEK.  12   No current  facility-administered medications for this visit.    OBJECTIVE: There were no vitals taken for this visit.   There is no weight on file to calculate BMI.    ECOG FS:0 - Asymptomatic  General: Well-developed, well-nourished, no acute distress. Eyes: Pink conjunctiva, anicteric sclera. HEENT: Normocephalic, moist mucous membranes, clear oropharnyx. Lungs: Clear to auscultation bilaterally. Heart: Regular rate and rhythm. No rubs, murmurs, or gallops. Abdomen: Soft, nontender, nondistended. No organomegaly noted, normoactive bowel sounds. Musculoskeletal: No edema, cyanosis, or clubbing. Neuro: Alert, answering all questions appropriately. Cranial nerves grossly intact. Skin: No rashes or petechiae noted. Psych: Normal affect.   LAB RESULTS:  No visits with results within 3 Day(s) from this visit. Latest known visit with results is:  Appointment on 03/17/2015  Component Date Value Ref Range Status  . Hemoglobin 03/17/2015 12.8  12.0 - 16.0 g/dL Final  . Ferritin 04/54/098106/24/2016 40  11 - 307 ng/mL Final    STUDIES: No results found.  ASSESSMENT:  Iron deficiency anemia  PLAN:  1. IDA. Patient's last dose of vitamin over was in March 2016. Today her hemoglobin is reported as 12.8 and her ferritin 40. Patient had previously declined oral iron therapy as it irritates her stomach. As previously planned by Dr. Lorre NickGittin patient would receive venofer 200 mg IV if ferritin is below 20. He states if patient is symptomatic and ferritin is less than 50 could also consider venofer. If patient is not symptomatic we will continue to monitor, patient is in agreement with this plan. She will return in 4 weeks for follow-up labs and also in 12 weeks to see provider and for lab work.  Patient expressed understanding and was in agreement with this plan. She also understands that She can call clinic at any time with any questions, concerns, or complaints.   Dr. Doylene Canninghoksi was available for consultation and  review of plan of care for this patient.   Loann QuillLeslie F Herring, NP   03/17/2015 11:07 AM

## 2015-04-03 NOTE — Progress Notes (Signed)
Note was entered under wrong date, 04/03/15. [facility]

## 2015-04-05 ENCOUNTER — Other Ambulatory Visit: Payer: Self-pay | Admitting: Family Medicine

## 2015-04-14 ENCOUNTER — Inpatient Hospital Stay: Payer: BC Managed Care – PPO | Attending: Family Medicine

## 2015-04-14 DIAGNOSIS — D509 Iron deficiency anemia, unspecified: Secondary | ICD-10-CM | POA: Insufficient documentation

## 2015-04-14 DIAGNOSIS — Z79899 Other long term (current) drug therapy: Secondary | ICD-10-CM | POA: Insufficient documentation

## 2015-04-14 DIAGNOSIS — Z9884 Bariatric surgery status: Secondary | ICD-10-CM | POA: Insufficient documentation

## 2015-04-18 ENCOUNTER — Inpatient Hospital Stay: Payer: BC Managed Care – PPO

## 2015-04-18 DIAGNOSIS — Z79899 Other long term (current) drug therapy: Secondary | ICD-10-CM | POA: Diagnosis not present

## 2015-04-18 DIAGNOSIS — D509 Iron deficiency anemia, unspecified: Secondary | ICD-10-CM | POA: Diagnosis not present

## 2015-04-18 DIAGNOSIS — Z9884 Bariatric surgery status: Secondary | ICD-10-CM | POA: Diagnosis not present

## 2015-04-18 LAB — FERRITIN: Ferritin: 31 ng/mL (ref 11–307)

## 2015-04-18 LAB — HEMOGLOBIN: HEMOGLOBIN: 13.1 g/dL (ref 12.0–16.0)

## 2015-05-12 ENCOUNTER — Inpatient Hospital Stay: Payer: BC Managed Care – PPO | Attending: Family Medicine

## 2015-06-05 ENCOUNTER — Other Ambulatory Visit: Payer: Self-pay | Admitting: Family Medicine

## 2015-06-05 ENCOUNTER — Inpatient Hospital Stay: Payer: BC Managed Care – PPO

## 2015-06-05 ENCOUNTER — Telehealth: Payer: Self-pay | Admitting: *Deleted

## 2015-06-05 DIAGNOSIS — Z9884 Bariatric surgery status: Secondary | ICD-10-CM | POA: Insufficient documentation

## 2015-06-05 DIAGNOSIS — K219 Gastro-esophageal reflux disease without esophagitis: Secondary | ICD-10-CM | POA: Diagnosis not present

## 2015-06-05 DIAGNOSIS — Z9049 Acquired absence of other specified parts of digestive tract: Secondary | ICD-10-CM | POA: Diagnosis not present

## 2015-06-05 DIAGNOSIS — D509 Iron deficiency anemia, unspecified: Secondary | ICD-10-CM

## 2015-06-05 DIAGNOSIS — I10 Essential (primary) hypertension: Secondary | ICD-10-CM | POA: Diagnosis not present

## 2015-06-05 DIAGNOSIS — E538 Deficiency of other specified B group vitamins: Secondary | ICD-10-CM | POA: Insufficient documentation

## 2015-06-05 DIAGNOSIS — Z79899 Other long term (current) drug therapy: Secondary | ICD-10-CM | POA: Diagnosis not present

## 2015-06-05 LAB — FERRITIN: Ferritin: 30 ng/mL (ref 11–307)

## 2015-06-05 LAB — CBC WITH DIFFERENTIAL/PLATELET
Basophils Absolute: 0.1 10*3/uL (ref 0–0.1)
Basophils Relative: 2 %
EOS ABS: 0.3 10*3/uL (ref 0–0.7)
EOS PCT: 4 %
HCT: 39.4 % (ref 35.0–47.0)
Hemoglobin: 13.1 g/dL (ref 12.0–16.0)
Lymphocytes Relative: 21 %
Lymphs Abs: 1.5 10*3/uL (ref 1.0–3.6)
MCH: 30.5 pg (ref 26.0–34.0)
MCHC: 33.1 g/dL (ref 32.0–36.0)
MCV: 92 fL (ref 80.0–100.0)
MONOS PCT: 7 %
Monocytes Absolute: 0.5 10*3/uL (ref 0.2–0.9)
NEUTROS PCT: 66 %
Neutro Abs: 4.4 10*3/uL (ref 1.4–6.5)
PLATELETS: 216 10*3/uL (ref 150–440)
RBC: 4.29 MIL/uL (ref 3.80–5.20)
RDW: 13.2 % (ref 11.5–14.5)
WBC: 6.8 10*3/uL (ref 3.6–11.0)

## 2015-06-05 NOTE — Telephone Encounter (Signed)
States she is not feeling well and would like to be seen today instead of the 16th.  I attempted to return pt call, but answering machine. I have spoken with L Herring, AGNP-C and pt can come in for lab check today and we will see what the results are and make further determination after results are back

## 2015-06-05 NOTE — Telephone Encounter (Signed)
Pt coming in for lab draw today lab informed

## 2015-06-07 ENCOUNTER — Inpatient Hospital Stay: Payer: BC Managed Care – PPO

## 2015-06-07 ENCOUNTER — Inpatient Hospital Stay: Payer: BC Managed Care – PPO | Attending: Family Medicine | Admitting: Family Medicine

## 2015-06-07 VITALS — BP 114/81 | HR 88 | Temp 99.3°F | Resp 18 | Ht 66.0 in | Wt 232.3 lb

## 2015-06-07 VITALS — BP 127/87 | HR 76 | Resp 18

## 2015-06-07 DIAGNOSIS — D509 Iron deficiency anemia, unspecified: Secondary | ICD-10-CM

## 2015-06-07 DIAGNOSIS — E538 Deficiency of other specified B group vitamins: Secondary | ICD-10-CM | POA: Diagnosis not present

## 2015-06-07 DIAGNOSIS — Z79899 Other long term (current) drug therapy: Secondary | ICD-10-CM

## 2015-06-07 DIAGNOSIS — Z9884 Bariatric surgery status: Secondary | ICD-10-CM

## 2015-06-07 MED ORDER — SODIUM CHLORIDE 0.9 % IJ SOLN
3.0000 mL | Freq: Once | INTRAMUSCULAR | Status: DC | PRN
  Filled 2015-06-07: qty 10

## 2015-06-07 MED ORDER — SODIUM CHLORIDE 0.9 % IV SOLN
Freq: Once | INTRAVENOUS | Status: AC
  Administered 2015-06-07: 12:00:00 via INTRAVENOUS
  Filled 2015-06-07: qty 1000

## 2015-06-07 MED ORDER — SODIUM CHLORIDE 0.9 % IV SOLN
510.0000 mg | Freq: Once | INTRAVENOUS | Status: AC
  Administered 2015-06-07: 510 mg via INTRAVENOUS
  Filled 2015-06-07: qty 17

## 2015-06-07 NOTE — Progress Notes (Signed)
[facility]  [phone]  [phone]     Layna Roeper DOB: 09/10/1961  MR#: [id number]  AOZ#:[id number]  Patient Care Team: [name], MD as PCP - General (Family Medicine)  CHIEF COMPLAINT:  Chief Complaint  Patient presents with  . Follow-up    for venefer  Patient is here for continued follow-up regarding iron deficiency anemia as well as B12 deficiency. History of gastric bypass surgery in 1998.  INTERVAL HISTORY: Patient returns for continued follow-up in regards to IDA and B12 deficiency. She reports feeling increasingly fatigued. She has been so fatigued that she has had to be out of work for the past 2 days. She denies any shortness of breath or chest pain reported. She is status post gastric bypass surgery in 1998. She reports that she starts to feel increasingly fatigued 8-10 weeks after she has an iron infusion.   REVIEW OF SYSTEMS:   Review of Systems  Constitutional: Positive for malaise/fatigue. Negative for fever, chills, weight loss and diaphoresis.  HENT: Negative for congestion, ear discharge, ear pain, hearing loss, nosebleeds, sore throat and tinnitus.   Eyes: Negative for blurred vision, double vision, photophobia, pain, discharge and redness.  Respiratory: Negative for cough, hemoptysis, sputum production, shortness of breath, wheezing and stridor.   Cardiovascular: Negative for chest pain, palpitations, orthopnea, claudication, leg swelling and PND.  Gastrointestinal: Negative for heartburn, nausea, vomiting, abdominal pain, diarrhea, constipation, blood in stool and melena.  Genitourinary: Negative.   Musculoskeletal: Negative.   Skin: Negative.   Neurological: Positive for weakness. Negative for dizziness, tingling, focal weakness, seizures and headaches.  Endo/Heme/Allergies: Does not bruise/bleed easily.  Psychiatric/Behavioral: Negative for depression. The patient is not nervous/anxious and does not have  insomnia.     As per HPI. Otherwise, a complete review of systems is negatve.   PAST MEDICAL HISTORY: Past Medical History  Diagnosis Date  . Complication of anesthesia     loss control of bowels after block  . Hypertension   . GERD (gastroesophageal reflux disease)   . Arthritis   . Anemia   . IDA (iron deficiency anemia) 03/17/2015  . IDA (iron deficiency anemia) 03/17/2015    PAST SURGICAL HISTORY: Past Surgical History  Procedure Laterality Date  . Abdominal hysterectomy  2008  . Tonsillectomy    . Appendectomy    . Joint replacement Left 12/2013    hip replacement at [facility]  . Cholecystectomy    . Colonoscopy      FAMILY HISTORY No family history on file.  GYNECOLOGIC HISTORY:  No LMP recorded. Patient has had a hysterectomy.     ADVANCED DIRECTIVES:    HEALTH MAINTENANCE: Social History  Substance Use Topics  . Smoking status: Never Smoker   . Smokeless tobacco: Never Used  . Alcohol Use: No     Colonoscopy:  PAP:  Bone density:  Lipid panel:  No Known Allergies  Current Outpatient Prescriptions  Medication Sig Dispense Refill  . gabapentin (NEURONTIN) 600 MG tablet Take 600 mg by mouth at bedtime.    [name] losartan (COZAAR) 25 MG tablet Take 25 mg by mouth daily.    . meloxicam (MOBIC) 7.5 MG tablet Take 7.5 mg by mouth daily.    [name] omeprazole (PRILOSEC) 40 MG capsule Take 40 mg by mouth daily.    . sertraline (ZOLOFT) 50 MG tablet 1/2 po qhs,if tolerates,increase to 1 po qhs after 1 week    . Vitamin D, Ergocalciferol, (DRISDOL) 50000 UNITS CAPS capsule TAKE 1  CAPSULE (50,000 UNITS TOTAL) BY MOUTH ONCE A WEEK.  12  . diazepam (VALIUM) 5 MG tablet Take 1-2 tablets (5-10 mg total) by mouth every 6 (six) hours as needed for muscle spasms. (Patient not taking: Reported on 06/07/2015) 60 tablet 0  . oxyCODONE-acetaminophen (PERCOCET/ROXICET) 5-325 MG per tablet Take 1-2 tablets by mouth every 4 (four) hours as needed for moderate pain. (Patient not taking:  Reported on 03/17/2015) 80 tablet 0  . traMADol (ULTRAM) 50 MG tablet Take 50 mg by mouth every 6 (six) hours as needed for moderate pain.     No current facility-administered medications for this visit.    OBJECTIVE: BP 114/81 mmHg  Pulse 88  Temp(Src) 99.3 F (37.4 C) (Tympanic)  Resp 18  Ht 5\' 6"  (1.676 m)  Wt 232 lb 4.1 oz (105.351 kg)  BMI 37.51 kg/m2   Body mass index is 37.51 kg/(m^2).    ECOG FS:1 - Symptomatic but completely ambulatory  General: Well-developed, well-nourished, no acute distress. Eyes: Pink conjunctiva, anicteric sclera. HEENT: Normocephalic, moist mucous membranes, clear oropharnyx. Lungs: Clear to auscultation bilaterally. Heart: Regular rate and rhythm. No rubs, murmurs, or gallops. Abdomen: Soft, nontender, nondistended. No organomegaly noted, normoactive bowel sounds. Musculoskeletal: No edema, cyanosis, or clubbing. Neuro: Alert, answering all questions appropriately. Cranial nerves grossly intact. Skin: No rashes or petechiae noted. Psych: Normal affect.   LAB RESULTS:  Appointment on 06/05/2015  Component Date Value Ref Range Status  . Ferritin 06/05/2015 30  11 - 307 ng/mL Final  . WBC 06/05/2015 6.8  3.6 - 11.0 K/uL Final  . RBC 06/05/2015 4.29  3.80 - 5.20 MIL/uL Final  . Hemoglobin 06/05/2015 13.1  12.0 - 16.0 g/dL Final  . HCT 16/06/9603 39.4  35.0 - 47.0 % Final  . MCV 06/05/2015 92.0  80.0 - 100.0 fL Final  . MCH 06/05/2015 30.5  26.0 - 34.0 pg Final  . MCHC 06/05/2015 33.1  32.0 - 36.0 g/dL Final  . RDW 54/05/8118 13.2  11.5 - 14.5 % Final  . Platelets 06/05/2015 216  150 - 440 K/uL Final  . Neutrophils Relative % 06/05/2015 66   Final  . Neutro Abs 06/05/2015 4.4  1.4 - 6.5 K/uL Final  . Lymphocytes Relative 06/05/2015 21   Final  . Lymphs Abs 06/05/2015 1.5  1.0 - 3.6 K/uL Final  . Monocytes Relative 06/05/2015 7   Final  . Monocytes Absolute 06/05/2015 0.5  0.2 - 0.9 K/uL Final  . Eosinophils Relative 06/05/2015 4   Final  .  Eosinophils Absolute 06/05/2015 0.3  0 - 0.7 K/uL Final  . Basophils Relative 06/05/2015 2   Final  . Basophils Absolute 06/05/2015 0.1  0 - 0.1 K/uL Final    STUDIES: No results found.  ASSESSMENT:  IDA.  PLAN:   1. IDA. Ferritin level is 30. With increasing fatigue and previously established goal ferritin of 50. She has previously been unable to tolerate oral iron and declines taking it again. Discussed with Dr. Orlie Dakin, she has been receiving venofer IV but has been suggested to trial feraheme infusions. Will proceed with feraheme 510mg  today and repeat in 1 week.  Will have patient return in 8 weeks for lab evaluation and provider visit.   Patient expressed understanding and was in agreement with this plan. She also understands that She can call clinic at any time with any questions, concerns, or complaints.   Dr. Orlie Dakin was available for consultation and review of plan of care for this patient.  Loann Quill, NP   06/07/2015 11:32 AM

## 2015-06-09 ENCOUNTER — Ambulatory Visit: Payer: BC Managed Care – PPO

## 2015-06-09 ENCOUNTER — Other Ambulatory Visit: Payer: BC Managed Care – PPO

## 2015-06-09 ENCOUNTER — Ambulatory Visit: Payer: BC Managed Care – PPO | Admitting: Family Medicine

## 2015-06-14 ENCOUNTER — Ambulatory Visit: Payer: BC Managed Care – PPO

## 2015-06-16 ENCOUNTER — Inpatient Hospital Stay: Payer: BC Managed Care – PPO

## 2015-06-16 VITALS — BP 116/80 | HR 68 | Temp 96.9°F | Resp 18

## 2015-06-16 DIAGNOSIS — D509 Iron deficiency anemia, unspecified: Secondary | ICD-10-CM

## 2015-06-16 MED ORDER — FERUMOXYTOL INJECTION 510 MG/17 ML
510.0000 mg | Freq: Once | INTRAVENOUS | Status: AC
  Administered 2015-06-16: 510 mg via INTRAVENOUS
  Filled 2015-06-16: qty 17

## 2015-06-16 MED ORDER — SODIUM CHLORIDE 0.9 % IV SOLN
Freq: Once | INTRAVENOUS | Status: AC
  Administered 2015-06-16: 09:00:00 via INTRAVENOUS
  Filled 2015-06-16: qty 1000

## 2015-06-20 ENCOUNTER — Other Ambulatory Visit: Payer: Self-pay | Admitting: Family Medicine

## 2015-06-24 ENCOUNTER — Ambulatory Visit
Admission: EM | Admit: 2015-06-24 | Discharge: 2015-06-24 | Disposition: A | Discharge status: Home / Self Care | Payer: BC Managed Care – PPO | Attending: Internal Medicine | Admitting: Internal Medicine

## 2015-06-24 ENCOUNTER — Ambulatory Visit: Payer: BC Managed Care – PPO

## 2015-06-24 ENCOUNTER — Encounter: Payer: Self-pay | Admitting: Gynecology

## 2015-06-24 DIAGNOSIS — R52 Pain, unspecified: Secondary | ICD-10-CM | POA: Diagnosis not present

## 2015-06-24 DIAGNOSIS — M7062 Trochanteric bursitis, left hip: Secondary | ICD-10-CM | POA: Diagnosis not present

## 2015-06-24 NOTE — ED Notes (Signed)
Patient c/o fall downstairs at home and fell on left side . Per pt. Left shoulder bruise and soarness / left side pain and would like x-ray on her left side shoulder and back.

## 2015-06-24 NOTE — Discharge Instructions (Signed)
xrays of low back and L hip are without fracture today. Physical therapy may be beneficial; ice may also be helpful a couple times daily for 5-10 minutes.

## 2015-06-24 NOTE — ED Provider Notes (Signed)
CSN: [id number]     Arrival date & time 06/24/15  4098 History   First MD Initiated Contact with Patient 06/24/15 [phone]     Chief Complaint  Patient presents with  . Fall   HPI 54yo lady coming down steps at night in the dark on 9/20, slipped at the top and fell all the way down. Landed on L lateral hip/shoulder.  Was able to get up independently.  No weakness/clumsiness/change in ROM in hips/legs; no change in bowel/bladder fxn. Persistent discomfort L lateral hip, some in L shoulder.  Hx L hip replacement, low back surgery.  Walked into urgent care independently; mostly worried about persistent/residual discomfort L lateral hip.  Past Medical History  Diagnosis Date  . Complication of anesthesia     loss control of bowels after block  . Hypertension   . GERD (gastroesophageal reflux disease)   . Arthritis   . Anemia   . IDA (iron deficiency anemia) 03/17/2015  . IDA (iron deficiency anemia) 03/17/2015   Past Surgical History  Procedure Laterality Date  . Abdominal hysterectomy  2008  . Tonsillectomy    . Appendectomy    . Joint replacement Left 12/2013    hip replacement at [facility]  . Cholecystectomy    . Colonoscopy      Social History  Substance Use Topics  . Smoking status: Never Smoker   . Smokeless tobacco: Never Used  . Alcohol Use: No    Review of Systems  All other systems reviewed and are negative.   Allergies  Review of patient's allergies indicates no known allergies.  Home Medications   Prior to Admission medications   Medication Sig Start Date End Date Taking? Authorizing Provider  diazepam (VALIUM) 5 MG tablet Take 1-2 tablets (5-10 mg total) by mouth every 6 (six) hours as needed for muscle spasms. Patient not taking: Reported on 06/07/2015 03/15/14   Julio Sicks, MD  gabapentin (NEURONTIN) 600 MG tablet Take 600 mg by mouth at bedtime.    Historical Provider, MD  losartan (COZAAR) 25 MG tablet Take 25 mg by mouth daily.    Historical Provider, MD   meloxicam (MOBIC) 7.5 MG tablet Take 7.5 mg by mouth daily.    Historical Provider, MD  omeprazole (PRILOSEC) 40 MG capsule Take 40 mg by mouth daily.    Historical Provider, MD  oxyCODONE-acetaminophen (PERCOCET/ROXICET) 5-325 MG per tablet Take 1-2 tablets by mouth every 4 (four) hours as needed for moderate pain. Patient not taking: Reported on 03/17/2015 03/15/14   Julio Sicks, MD  sertraline (ZOLOFT) 50 MG tablet 1/2 po qhs,if tolerates,increase to 1 po qhs after 1 week 01/12/15   Historical Provider, MD  traMADol (ULTRAM) 50 MG tablet Take 50 mg by mouth every 6 (six) hours as needed for moderate pain.    Historical Provider, MD  Vitamin D, Ergocalciferol, (DRISDOL) 50000 UNITS CAPS capsule TAKE 1 CAPSULE (50,000 UNITS TOTAL) BY MOUTH ONCE A WEEK. 03/09/15   Historical Provider, MD   Meds Ordered and Administered this Visit  Medications - No data to display  BP 127/90 mmHg  Pulse 87  Temp(Src) 98.1 F (36.7 C) (Oral)  Ht  (1.626 m)  Wt 233 lb 1.6 oz (105.733 kg)  BMI 39.99 kg/m2  SpO2 100% No data found.   Physical Exam  Constitutional: She is oriented to person, place, and time. No distress.  Alert, nicely groomed  HENT:  Head: Atraumatic.  Eyes:  Conjugate gaze, no eye redness/drainage  Neck: Neck supple.  Cardiovascular: Normal rate.   Pulmonary/Chest: No respiratory distress.  Abdominal: She exhibits no distension.  Musculoskeletal:  Bilateral shoulders with symmetric, full, range of motion, able to fully extend overhead, internally and externally rotate. Strength in the bilateral lower extremities is 5/5, symmetric, proximal and distal Mild to moderate tenderness to palpation over the left greater trochanteric bursa area No low back tenderness  Neurological: She is alert and oriented to person, place, and time.  Skin: Skin is warm and dry.  No cyanosis  Nursing note and vitals reviewed.   ED Course  Procedures (including critical care time)  Imaging  Review Dg Lumbar Spine Complete  06/24/2015   CLINICAL DATA:  Lumbago following fall 11 days prior  EXAM: LUMBAR SPINE - COMPLETE 4+ VIEW  COMPARISON:  Lumbar spine radiographs May 12, 2014; lumbar MRI June 28, 2014  FINDINGS: Frontal, lateral, spot lumbosacral lateral, and bilateral oblique views were obtained. There are 5 non-rib-bearing lumbar type vertebral bodies. There is pedicle screw and plate fixation at L4 and L5 bilaterally. The screw and plate fixation devices as well as a disc spacer at L4-5 appear intact. There is no fracture or spondylolisthesis. There is slight dextroscoliosis. There is slight disc space narrowing at L5-S1. Other disc spaces appear normal. There is facet osteoarthritic change at L4-5 and L5-S1 bilaterally. There is a total hip prosthesis on the left.  IMPRESSION: Postoperative change at L4 and L5. 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 Slight scoliosis. No fracture or spondylolisthesis.   Electronically Signed   By: Bretta Bang III M.D.   On: 06/24/2015 10:40   Dg Hip Unilat With Pelvis 2-3 Views Left  06/24/2015   CLINICAL DATA:  Prior left hip replacement, recent fall 11 days ago, persistent low back and left hip pain new new  EXAM: DG HIP (WITH OR WITHOUT PELVIS) 2-3V LEFT  COMPARISON:  11/06/2013, 06/24/2015  FINDINGS: Prior lower lumbar fusion and left hip arthroplasty noted. Left hip arthroplasty appears intact and aligned. No subluxation or dislocation. No acute osseous or hardware abnormality. Bony pelvis and right hip intact. Normal appearing SI joints for age. Vascular calcifications in the left pelvis.  IMPRESSION: No acute osseous or hardware abnormality by plain radiography.   Electronically Signed   By: Judie Petit.  Shick M.D.   On: 06/24/2015 10:49    MDM   1. Greater trochanteric bursitis, left   2. Pain    Patient will continue taking Mobic; she is not having GI distress. Pain has gradually subsided, although she still has  discomfort in her left lateral hip at bedtime. If symptoms persist, she might benefit from physical therapy. Ice may also be beneficial, 5-10 minutes a couple times daily. Follow-up with Dr. Hooten/orthopedics for persistent symptoms.    Eustace Moore, MD 06/24/15 [phone]

## 2015-08-02 ENCOUNTER — Other Ambulatory Visit: Payer: Self-pay | Admitting: *Deleted

## 2015-08-02 ENCOUNTER — Inpatient Hospital Stay: Payer: BC Managed Care – PPO

## 2015-08-02 ENCOUNTER — Inpatient Hospital Stay: Payer: BC Managed Care – PPO | Admitting: Internal Medicine

## 2015-08-02 DIAGNOSIS — D509 Iron deficiency anemia, unspecified: Secondary | ICD-10-CM

## 2015-08-22 NOTE — Telephone Encounter (Signed)
NA

## 2015-08-23 ENCOUNTER — Other Ambulatory Visit: Payer: BC Managed Care – PPO

## 2015-08-23 ENCOUNTER — Ambulatory Visit: Payer: BC Managed Care – PPO | Admitting: Internal Medicine

## 2015-08-23 ENCOUNTER — Ambulatory Visit: Payer: BC Managed Care – PPO

## 2015-08-25 ENCOUNTER — Inpatient Hospital Stay (HOSPITAL_BASED_OUTPATIENT_CLINIC_OR_DEPARTMENT_OTHER): Payer: BC Managed Care – PPO | Admitting: Internal Medicine

## 2015-08-25 ENCOUNTER — Inpatient Hospital Stay: Payer: BC Managed Care – PPO

## 2015-08-25 ENCOUNTER — Inpatient Hospital Stay: Payer: BC Managed Care – PPO | Attending: Internal Medicine

## 2015-08-25 ENCOUNTER — Encounter: Payer: Self-pay | Admitting: Internal Medicine

## 2015-08-25 VITALS — BP 137/91 | HR 76 | Temp 97.9°F | Resp 20 | Wt 224.9 lb

## 2015-08-25 DIAGNOSIS — I10 Essential (primary) hypertension: Secondary | ICD-10-CM | POA: Insufficient documentation

## 2015-08-25 DIAGNOSIS — Z9884 Bariatric surgery status: Secondary | ICD-10-CM

## 2015-08-25 DIAGNOSIS — G2581 Restless legs syndrome: Secondary | ICD-10-CM | POA: Insufficient documentation

## 2015-08-25 DIAGNOSIS — Z79899 Other long term (current) drug therapy: Secondary | ICD-10-CM

## 2015-08-25 DIAGNOSIS — D509 Iron deficiency anemia, unspecified: Secondary | ICD-10-CM

## 2015-08-25 DIAGNOSIS — K219 Gastro-esophageal reflux disease without esophagitis: Secondary | ICD-10-CM | POA: Insufficient documentation

## 2015-08-25 LAB — COMPREHENSIVE METABOLIC PANEL
ALK PHOS: 98 U/L (ref 38–126)
ALT: 18 U/L (ref 14–54)
ANION GAP: 7 (ref 5–15)
AST: 22 U/L (ref 15–41)
Albumin: 3.7 g/dL (ref 3.5–5.0)
BUN: 15 mg/dL (ref 6–20)
CALCIUM: 8.9 mg/dL (ref 8.9–10.3)
CHLORIDE: 104 mmol/L (ref 101–111)
CO2: 26 mmol/L (ref 22–32)
CREATININE: 0.69 mg/dL (ref 0.44–1.00)
Glucose, Bld: 118 mg/dL — ABNORMAL HIGH (ref 65–99)
Potassium: 3.6 mmol/L (ref 3.5–5.1)
SODIUM: 137 mmol/L (ref 135–145)
Total Bilirubin: 0.5 mg/dL (ref 0.3–1.2)
Total Protein: 6.9 g/dL (ref 6.5–8.1)

## 2015-08-25 LAB — CBC WITH DIFFERENTIAL/PLATELET
Basophils Absolute: 0 10*3/uL (ref 0–0.1)
Basophils Relative: 1 %
EOS PCT: 4 %
Eosinophils Absolute: 0.2 10*3/uL (ref 0–0.7)
HCT: 41.1 % (ref 35.0–47.0)
HEMOGLOBIN: 13.6 g/dL (ref 12.0–16.0)
LYMPHS ABS: 1.4 10*3/uL (ref 1.0–3.6)
LYMPHS PCT: 28 %
MCH: 30.7 pg (ref 26.0–34.0)
MCHC: 33.1 g/dL (ref 32.0–36.0)
MCV: 92.7 fL (ref 80.0–100.0)
Monocytes Absolute: 0.4 10*3/uL (ref 0.2–0.9)
Monocytes Relative: 7 %
NEUTROS PCT: 60 %
Neutro Abs: 3.2 10*3/uL (ref 1.4–6.5)
PLATELETS: 215 10*3/uL (ref 150–440)
RBC: 4.43 MIL/uL (ref 3.80–5.20)
RDW: 13.9 % (ref 11.5–14.5)
WBC: 5.2 10*3/uL (ref 3.6–11.0)

## 2015-08-25 LAB — IRON AND TIBC
IRON: 96 ug/dL (ref 28–170)
Saturation Ratios: 39 % — ABNORMAL HIGH (ref 10.4–31.8)
TIBC: 244 ug/dL — AB (ref 250–450)
UIBC: 148 ug/dL

## 2015-08-25 LAB — FERRITIN: FERRITIN: 142 ng/mL (ref 11–307)

## 2015-08-25 LAB — VITAMIN B12: VITAMIN B 12: 974 pg/mL — AB (ref 180–914)

## 2015-08-25 NOTE — Progress Notes (Signed)
[facility] OFFICE PROGRESS NOTE  Patient Care Team: [name], MD as PCP - General (Family Medicine)   SUMMARY OF ONCOLOGIC HISTORY:  # Iron deficiency/ restless leg syndrome- sec to gastric bypass- on IV iron infusion q 62M  #  B12 def sec to gastric Bypass [1998]-   INTERVAL HISTORY:  This is my first interaction with the patient since I joined the practice September 2016. I reviewed the patient's prior charts/pertinent labs/imaging in detail; findings are summarized above.   A very pleasant 54 year old female patient with above history of gastric bypass in 1998- history of restless legs syndrome and fatigue secondary to functional iron deficiency visit for follow-up.  Patient's last visit was approximately 2 months ago; when she received Feraheme. She feels her symptoms of restless leg/fatigue improved post IV iron infusion. She does not tolerate by mouth iron secondary to dyspepsia.  REVIEW OF SYSTEMS:  A complete 10 point review of system is done which is negative except mentioned above/history of present illness.   PAST MEDICAL HISTORY :  Past Medical History  Diagnosis Date  . Complication of anesthesia     loss control of bowels after block  . Hypertension   . GERD (gastroesophageal reflux disease)   . Arthritis   . Anemia   . IDA (iron deficiency anemia) 03/17/2015  . IDA (iron deficiency anemia) 03/17/2015    PAST SURGICAL HISTORY :   Past Surgical History  Procedure Laterality Date  . Abdominal hysterectomy  2008  . Tonsillectomy    . Appendectomy    . Joint replacement Left 12/2013    hip replacement at [facility]  . Cholecystectomy    . Colonoscopy      FAMILY HISTORY :  History reviewed. No pertinent family history.  SOCIAL HISTORY:   Social History  Substance Use Topics  . Smoking status: Never Smoker   . Smokeless tobacco: Never Used  . Alcohol Use: No    ALLERGIES:  has No Known Allergies.  MEDICATIONS:  Current Outpatient  Prescriptions  Medication Sig Dispense Refill  . AFLURIA PRESERVATIVE FREE 0.5 ML SUSY inject 0.5 milliliter intramuscularly  0  . gabapentin (NEURONTIN) 600 MG tablet Take 600 mg by mouth at bedtime.    [name] losartan (COZAAR) 25 MG tablet Take 25 mg by mouth daily.    . meloxicam (MOBIC) 7.5 MG tablet Take 7.5 mg by mouth daily.    . NON FORMULARY Tumeric capsules daily    . omeprazole (PRILOSEC) 40 MG capsule Take 40 mg by mouth daily.    . sertraline (ZOLOFT) 50 MG tablet 1/2 po qhs,if tolerates,increase to 1 po qhs after 1 week    . Vitamin D, Ergocalciferol, (DRISDOL) 50000 UNITS CAPS capsule TAKE 1 CAPSULE (50,000 UNITS TOTAL) BY MOUTH ONCE A WEEK.  12  . methocarbamol (ROBAXIN) 750 MG tablet Take 1 tablet by mouth 3 (three) times daily as needed.  0   No current facility-administered medications for this visit.    PHYSICAL EXAMINATION:  BP 137/91 mmHg  Pulse 76  Temp(Src) 97.9 F (36.6 C) (Tympanic)  Resp 20  Wt 224 lb 13.9 oz (102 kg)  Filed Weights   08/25/15 1050  Weight: 224 lb 13.9 oz (102 kg)    GENERAL: Well-nourished well-developed; Alert, no distress and comfortable.    EYES: no pallor or icterus OROPHARYNX: no thrush or ulceration; good dentition  NECK: supple, no masses felt LYMPH:  no palpable lymphadenopathy in the cervical, axillary or inguinal regions LUNGS:  clear to auscultation and  No wheeze or crackles HEART/CVS: regular rate & rhythm and no murmurs; No lower extremity edema ABDOMEN:abdomen soft, non-tender and normal bowel sounds Musculoskeletal:no cyanosis of digits and no clubbing  PSYCH: alert & oriented x 3 with fluent speech NEURO: no focal motor/sensory deficits SKIN:  no rashes or significant lesions  LABORATORY DATA:  I have reviewed the data as listed    Component Value Date/Time   NA 137 08/25/2015 1017   NA 141 01/19/2014 0457   K 3.6 08/25/2015 1017   K 3.7 01/19/2014 0457   CL 104 08/25/2015 1017   CL 106 01/19/2014 0457   CO2  26 08/25/2015 1017   CO2 29 01/19/2014 0457   GLUCOSE 118* 08/25/2015 1017   GLUCOSE 100* 01/19/2014 0457   BUN 15 08/25/2015 1017   BUN 7 01/19/2014 0457   CREATININE 0.69 08/25/2015 1017   CREATININE 0.96 06/28/2014 0910   CALCIUM 8.9 08/25/2015 1017   CALCIUM 8.1* 01/19/2014 0457   PROT 6.9 08/25/2015 1017   ALBUMIN 3.7 08/25/2015 1017   AST 22 08/25/2015 1017   ALT 18 08/25/2015 1017   ALKPHOS 98 08/25/2015 1017   BILITOT 0.5 08/25/2015 1017   GFRNONAA >60 08/25/2015 1017   GFRNONAA >60 06/28/2014 0910   GFRNONAA >60 01/19/2014 0457   GFRAA >60 08/25/2015 1017   GFRAA >60 06/28/2014 0910   GFRAA >60 01/19/2014 0457    No results found for: SPEP, UPEP  Lab Results  Component Value Date   WBC 5.2 08/25/2015   NEUTROABS 3.2 08/25/2015   HGB 13.6 08/25/2015   HCT 41.1 08/25/2015   MCV 92.7 08/25/2015   PLT 215 08/25/2015      Chemistry      Component Value Date/Time   NA 137 08/25/2015 1017   NA 141 01/19/2014 0457   K 3.6 08/25/2015 1017   K 3.7 01/19/2014 0457   CL 104 08/25/2015 1017   CL 106 01/19/2014 0457   CO2 26 08/25/2015 1017   CO2 29 01/19/2014 0457   BUN 15 08/25/2015 1017   BUN 7 01/19/2014 0457   CREATININE 0.69 08/25/2015 1017   CREATININE 0.96 06/28/2014 0910      Component Value Date/Time   CALCIUM 8.9 08/25/2015 1017   CALCIUM 8.1* 01/19/2014 0457   ALKPHOS 98 08/25/2015 1017   AST 22 08/25/2015 1017   ALT 18 08/25/2015 1017   BILITOT 0.5 08/25/2015 1017       RADIOGRAPHIC STUDIES: I have personally reviewed the radiological images as listed and agreed with the findings in the report. No results found.   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 Even though patient does not have any anemia. Patient tends to have extreme fatigue/restless leg syndrome from low ferritin levels. I think it's reasonable to keep the ferritin around 50- to help with the patient's symptoms.  # B12 sec to gastric  bypass.  Encouraged sublingual B12.  # Will add ferritin to labs today. If less than 50 recommend IV iron next week; plan labs again in 8 weeks CBC ferritin; follow-up with me in 9 weeks/ possible IV iron infusion.  # 15 minutes face-to-face with the patient discussing the above plan of care; more than 50% of time spent on prognosis/ natural history; counseling and coordination.  All questions were answered. The patient knows to call the clinic with any problems, questions or concerns. No barriers to learning was detected. I  Earna CoderGovinda R Foday Cone, MD 08/25/2015 11:33 AM

## 2015-08-25 NOTE — Progress Notes (Signed)
Quick Note:  .[name]. Inform patient that ferritin is 142; IV iron only if less than 50. Follow-up is planned in 2 months. ______

## 2015-08-28 ENCOUNTER — Telehealth: Payer: Self-pay | Admitting: *Deleted

## 2015-08-28 NOTE — Telephone Encounter (Signed)
-----   Message from Earna CoderGovinda R Brahmanday, MD sent at 08/25/2015  4:54 PM EST ----- .[name]. Inform patient that ferritin is 142; IV iron only if less than 50. Follow-up is planned in 2 months.

## 2015-10-20 ENCOUNTER — Inpatient Hospital Stay: Payer: BC Managed Care – PPO | Attending: Internal Medicine

## 2015-10-27 ENCOUNTER — Inpatient Hospital Stay: Payer: BC Managed Care – PPO

## 2015-10-27 ENCOUNTER — Inpatient Hospital Stay: Payer: BC Managed Care – PPO | Admitting: Internal Medicine

## 2015-10-30 ENCOUNTER — Ambulatory Visit: Payer: BC Managed Care – PPO | Admitting: Internal Medicine

## 2015-10-30 ENCOUNTER — Ambulatory Visit: Payer: BC Managed Care – PPO

## 2015-11-03 ENCOUNTER — Inpatient Hospital Stay: Payer: BC Managed Care – PPO | Attending: Internal Medicine

## 2015-11-03 ENCOUNTER — Inpatient Hospital Stay: Payer: BC Managed Care – PPO

## 2015-11-03 ENCOUNTER — Inpatient Hospital Stay: Payer: BC Managed Care – PPO | Admitting: Internal Medicine

## 2015-11-03 DIAGNOSIS — Z79899 Other long term (current) drug therapy: Secondary | ICD-10-CM | POA: Diagnosis not present

## 2015-11-03 DIAGNOSIS — D509 Iron deficiency anemia, unspecified: Secondary | ICD-10-CM | POA: Diagnosis not present

## 2015-11-03 DIAGNOSIS — Z9884 Bariatric surgery status: Secondary | ICD-10-CM | POA: Diagnosis not present

## 2015-11-03 LAB — CBC WITH DIFFERENTIAL/PLATELET
Basophils Absolute: 0 10*3/uL (ref 0–0.1)
Basophils Relative: 0 %
Eosinophils Absolute: 0.2 10*3/uL (ref 0–0.7)
Eosinophils Relative: 4 %
HEMATOCRIT: 40.2 % (ref 35.0–47.0)
HEMOGLOBIN: 13.6 g/dL (ref 12.0–16.0)
LYMPHS PCT: 28 %
Lymphs Abs: 1.6 10*3/uL (ref 1.0–3.6)
MCH: 31.4 pg (ref 26.0–34.0)
MCHC: 33.9 g/dL (ref 32.0–36.0)
MCV: 92.6 fL (ref 80.0–100.0)
MONO ABS: 0.4 10*3/uL (ref 0.2–0.9)
MONOS PCT: 7 %
NEUTROS ABS: 3.3 10*3/uL (ref 1.4–6.5)
NEUTROS PCT: 61 %
Platelets: 183 10*3/uL (ref 150–440)
RBC: 4.34 MIL/uL (ref 3.80–5.20)
RDW: 13.2 % (ref 11.5–14.5)
WBC: 5.5 10*3/uL (ref 3.6–11.0)

## 2015-11-03 LAB — FERRITIN: Ferritin: 107 ng/mL (ref 11–307)

## 2015-11-10 ENCOUNTER — Encounter: Payer: Self-pay | Admitting: *Deleted

## 2015-11-10 ENCOUNTER — Inpatient Hospital Stay: Payer: BC Managed Care – PPO

## 2015-11-10 ENCOUNTER — Inpatient Hospital Stay: Payer: BC Managed Care – PPO | Admitting: Internal Medicine

## 2015-11-10 NOTE — Progress Notes (Signed)
Patient has missed 3 consecutive appointments with Dr. Donneta Romberg. MD will send discharge letter-certified mail to pt.

## 2015-11-20 ENCOUNTER — Telehealth: Payer: Self-pay | Admitting: Internal Medicine

## 2015-11-20 ENCOUNTER — Telehealth: Payer: Self-pay | Admitting: *Deleted

## 2015-11-20 NOTE — Telephone Encounter (Signed)
Pt called and spoke to [facility] in cancer center scheduling. She received the 3rd no show letter in the mail. She is "very upset that she received this letter." Colette explained to patient that the patient should be responsible for keeping her appointment. I spoke with Dr. Donneta Romberg, who is willing to r/s this appointment 1 more time. If the patient does not keep this md appointment, she will be officially dismissed from the practice.

## 2015-12-01 ENCOUNTER — Inpatient Hospital Stay: Payer: BC Managed Care – PPO | Attending: Internal Medicine | Admitting: Internal Medicine

## 2015-12-01 ENCOUNTER — Inpatient Hospital Stay: Payer: BC Managed Care – PPO

## 2015-12-01 VITALS — BP 145/90 | HR 75 | Temp 98.1°F | Resp 18 | Wt 229.3 lb

## 2015-12-01 DIAGNOSIS — Z7982 Long term (current) use of aspirin: Secondary | ICD-10-CM | POA: Insufficient documentation

## 2015-12-01 DIAGNOSIS — Z79899 Other long term (current) drug therapy: Secondary | ICD-10-CM | POA: Diagnosis not present

## 2015-12-01 DIAGNOSIS — D509 Iron deficiency anemia, unspecified: Secondary | ICD-10-CM | POA: Diagnosis not present

## 2015-12-01 DIAGNOSIS — Z9884 Bariatric surgery status: Secondary | ICD-10-CM | POA: Diagnosis not present

## 2015-12-01 DIAGNOSIS — I10 Essential (primary) hypertension: Secondary | ICD-10-CM | POA: Insufficient documentation

## 2015-12-01 DIAGNOSIS — M1990 Unspecified osteoarthritis, unspecified site: Secondary | ICD-10-CM | POA: Diagnosis not present

## 2015-12-01 DIAGNOSIS — G2581 Restless legs syndrome: Secondary | ICD-10-CM | POA: Diagnosis not present

## 2015-12-01 DIAGNOSIS — K219 Gastro-esophageal reflux disease without esophagitis: Secondary | ICD-10-CM | POA: Diagnosis not present

## 2015-12-01 NOTE — Progress Notes (Signed)
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  Patient's appetite is good. Denies any blood in stools black stools. She has mild to moderate fatigue. No chest pain or shortness of breath. Poor tolerance to iron.    REVIEW OF SYSTEMS:  A complete 10 point review of system is done which is negative except mentioned above/history of present illness.   PAST MEDICAL HISTORY :  Past Medical History  Diagnosis Date  . Complication of anesthesia     loss control of bowels after block  . Hypertension   . GERD (gastroesophageal reflux disease)   . Arthritis   . Anemia   . IDA (iron deficiency anemia) 03/17/2015  . IDA (iron deficiency anemia) 03/17/2015    PAST SURGICAL HISTORY :   Past Surgical History  Procedure Laterality Date  . Abdominal hysterectomy  2008  . Tonsillectomy    . Appendectomy    . Joint replacement Left 12/2013    hip replacement at [facility]  . Cholecystectomy    . Colonoscopy      FAMILY HISTORY :  No family history on file.  SOCIAL HISTORY:   Social History  Substance Use Topics  . Smoking status: Never Smoker   . Smokeless tobacco: Never Used  . Alcohol Use: No    ALLERGIES:  has No Known Allergies.  MEDICATIONS:  Current Outpatient Prescriptions  Medication Sig Dispense Refill  . acetaminophen (TYLENOL) 650 MG CR tablet Take 650 mg by mouth every 8 (eight) hours as needed for pain.    [name] AFLURIA PRESERVATIVE FREE 0.5 ML SUSY inject 0.5 milliliter intramuscularly  0  . aspirin 81 MG tablet  Take 81 mg by mouth daily.    [name] co-enzyme Q-10 30 MG capsule Take 30 mg by mouth 3 (three) times daily.    [name] gabapentin (NEURONTIN) 600 MG tablet Take 600 mg by mouth at bedtime.    [name] losartan (COZAAR) 25 MG tablet Take 25 mg by mouth daily.    . meloxicam (MOBIC) 7.5 MG tablet Take 7.5 mg by mouth daily.    . methocarbamol (ROBAXIN) 750 MG tablet Take 1 tablet by mouth 3 (three) times daily as needed.  0  . Multiple Vitamin (MULTIVITAMIN) tablet Take 1 tablet by mouth daily.    . NON FORMULARY Tumeric capsules daily    . Omega-3 Fatty Acids (FISH OIL) 1200 MG CAPS Take by mouth.    . sertraline (ZOLOFT) 50 MG tablet 1/2 po qhs,if tolerates,increase to 1 po qhs after 1 week    . TURMERIC PO Take by mouth.    . Vitamin D, Ergocalciferol, (DRISDOL) 50000 UNITS CAPS capsule TAKE 1 CAPSULE (50,000 UNITS TOTAL) BY MOUTH ONCE A WEEK.  12   No current facility-administered medications for this visit.    PHYSICAL EXAMINATION:  BP 145/90 mmHg  Pulse 75  Temp(Src) 98.1 F (36.7 C) (Tympanic)  Resp 18  Wt 229 lb 4.5 oz (104 kg)  Filed Weights   12/01/15 1353  Weight: 229 lb 4.5 oz (104 kg)  GENERAL: Well-nourished well-developed; Alert, no distress and comfortable.    EYES: no pallor or icterus OROPHARYNX: no thrush or ulceration; good dentition  NECK: supple, no masses felt LYMPH:  no palpable lymphadenopathy in the cervical, axillary or inguinal regions LUNGS: clear to auscultation and  No wheeze or crackles HEART/CVS: regular rate & rhythm and no murmurs; No lower extremity edema ABDOMEN:abdomen soft, non-tender and normal bowel sounds Musculoskeletal:no cyanosis of digits and no clubbing  PSYCH: alert & oriented x 3 with fluent speech NEURO: no focal motor/sensory deficits SKIN:  no rashes or significant lesions  LABORATORY DATA:  I have reviewed the data as listed    Component Value Date/Time   NA 137 08/25/2015 1017   NA 141 01/19/2014 0457   K 3.6 08/25/2015 1017   K  3.7 01/19/2014 0457   CL 104 08/25/2015 1017   CL 106 01/19/2014 0457   CO2 26 08/25/2015 1017   CO2 29 01/19/2014 0457   GLUCOSE 118* 08/25/2015 1017   GLUCOSE 100* 01/19/2014 0457   BUN 15 08/25/2015 1017   BUN 7 01/19/2014 0457   CREATININE 0.69 08/25/2015 1017   CREATININE 0.96 06/28/2014 0910   CALCIUM 8.9 08/25/2015 1017   CALCIUM 8.1* 01/19/2014 0457   PROT 6.9 08/25/2015 1017   ALBUMIN 3.7 08/25/2015 1017   AST 22 08/25/2015 1017   ALT 18 08/25/2015 1017   ALKPHOS 98 08/25/2015 1017   BILITOT 0.5 08/25/2015 1017   GFRNONAA >60 08/25/2015 1017   GFRNONAA >60 06/28/2014 0910   GFRNONAA >60 01/19/2014 0457   GFRAA >60 08/25/2015 1017   GFRAA >60 06/28/2014 0910   GFRAA >60 01/19/2014 0457    No results found for: SPEP, UPEP  Lab Results  Component Value Date   WBC 5.5 11/03/2015   NEUTROABS 3.3 11/03/2015   HGB 13.6 11/03/2015   HCT 40.2 11/03/2015   MCV 92.6 11/03/2015   PLT 183 11/03/2015      Chemistry      Component Value Date/Time   NA 137 08/25/2015 1017   NA 141 01/19/2014 0457   K 3.6 08/25/2015 1017   K 3.7 01/19/2014 0457   CL 104 08/25/2015 1017   CL 106 01/19/2014 0457   CO2 26 08/25/2015 1017   CO2 29 01/19/2014 0457   BUN 15 08/25/2015 1017   BUN 7 01/19/2014 0457   CREATININE 0.69 08/25/2015 1017   CREATININE 0.96 06/28/2014 0910      Component Value Date/Time   CALCIUM 8.9 08/25/2015 1017   CALCIUM 8.1* 01/19/2014 0457   ALKPHOS 98 08/25/2015 1017   AST 22 08/25/2015 1017   ALT 18 08/25/2015 1017   BILITOT 0.5 08/25/2015 1017       ASSESSMENT & PLAN:   # 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 Even though patient does not have any anemia. Patient tends to have extreme fatigue/restless leg syndrome from low ferritin levels. I think it's reasonable to keep the ferritin around 50- to help with the patient's symptoms.  # Recent ferritin February 104/hemoglobin normal- recommend holding any IV iron today.  # B12  sec to gastric bypass.  Encouraged sublingual B12. October 2016 normal  # Recommend follow-up with me  in 3 months; CBC ferritin a week prior; possible IV Feraheme at that time.  # 15 minutes face-to-face with the patient discussing the above plan of care; more than 50% of time spent on natural history; counseling and coordination.     Earna CoderGovinda R Brahmanday, MD 12/01/2015  2:17 PM

## 2016-02-23 ENCOUNTER — Other Ambulatory Visit: Payer: BC Managed Care – PPO

## 2016-02-27 ENCOUNTER — Encounter: Payer: Self-pay | Admitting: *Deleted

## 2016-02-28 NOTE — Discharge Instructions (Signed)
[facility] °[facility] ° °POST OPERATIVE INSTRUCTIONS FOR DR. TROXLER AND DR. FOWLER °[facility] ° ° °1. Take your medication as prescribed.  Pain medication should be taken only as needed. ° °2. Keep the dressing clean, dry and intact. ° °3. Keep your foot elevated above the heart level for the first 48 hours. ° °4. Walking to the bathroom and brief periods of walking are acceptable, unless we have instructed you to be non-weight bearing. ° °5. Always wear your post-op shoe when walking.  Always use your crutches if you are to be non-weight bearing. ° °6. Do not take a shower. Baths are permissible as long as the foot is kept out of the water.  ° °7. Every hour you are awake:  °- Bend your knee 15 times. °- Flex foot 15 times °- Massage calf 15 times ° °8. Call [facility] ([phone]) if any of the following problems occur: °- You develop a temperature or fever. °- The bandage becomes saturated with blood. °- Medication does not stop your pain. °- Injury of the foot occurs. °- Any symptoms of infection including redness, odor, or red streaks running from wound. ° °General Anesthesia, Adult, Care After °Refer to this sheet in the next few weeks. These instructions provide you with information on caring for yourself after your procedure. Your health care provider may also give you more specific instructions. Your treatment has been planned according to current medical practices, but problems sometimes occur. Call your health care provider if you have any problems or questions after your procedure. °WHAT TO EXPECT AFTER THE PROCEDURE °After the procedure, it is typical to experience: °· Sleepiness. °· Nausea and vomiting. °HOME CARE INSTRUCTIONS °· For the first 24 hours after general anesthesia: °¨ Have a responsible person with you. °¨ Do not drive a car. If you are alone, do not take public transportation. °¨ Do not drink alcohol. °¨ Do not take  medicine that has not been prescribed by your health care provider. °¨ Do not sign important papers or make important decisions. °¨ You may resume a normal diet and activities as directed by your health care provider. °· Change bandages (dressings) as directed. °· If you have questions or problems that seem related to general anesthesia, call the hospital and ask for the anesthetist or anesthesiologist on call. °SEEK MEDICAL CARE IF: °· You have nausea and vomiting that continue the day after anesthesia. °· You develop a rash. °SEEK IMMEDIATE MEDICAL CARE IF:  °· You have difficulty breathing. °· You have chest pain. °· You have any allergic problems. °  °This information is not intended to replace advice given to you by your health care provider. Make sure you discuss any questions you have with your health care provider. °  °Document Released: 12/16/2000 Document Revised: 09/30/2014 Document Reviewed: 01/08/2012 °Elsevier Interactive Patient Education ©2016 [employer] Inc[employer] ° °

## 2016-03-01 ENCOUNTER — Inpatient Hospital Stay: Payer: BC Managed Care – PPO | Attending: Internal Medicine

## 2016-03-01 DIAGNOSIS — D509 Iron deficiency anemia, unspecified: Secondary | ICD-10-CM | POA: Insufficient documentation

## 2016-03-01 DIAGNOSIS — Z79899 Other long term (current) drug therapy: Secondary | ICD-10-CM | POA: Insufficient documentation

## 2016-03-01 DIAGNOSIS — Z9884 Bariatric surgery status: Secondary | ICD-10-CM | POA: Insufficient documentation

## 2016-03-07 ENCOUNTER — Ambulatory Visit: Payer: BC Managed Care – PPO | Admitting: Anesthesiology

## 2016-03-07 ENCOUNTER — Ambulatory Visit
Admission: RE | Admit: 2016-03-07 | Discharge: 2016-03-07 | Disposition: A | Discharge status: Home / Self Care | Payer: BC Managed Care – PPO | Source: Ambulatory Visit | Attending: Podiatry | Admitting: Podiatry

## 2016-03-07 ENCOUNTER — Encounter
Admission: RE | Disposition: A | Discharge status: Home / Self Care | Payer: Self-pay | Source: Ambulatory Visit | Attending: Podiatry

## 2016-03-07 DIAGNOSIS — I10 Essential (primary) hypertension: Secondary | ICD-10-CM | POA: Insufficient documentation

## 2016-03-07 DIAGNOSIS — K219 Gastro-esophageal reflux disease without esophagitis: Secondary | ICD-10-CM | POA: Insufficient documentation

## 2016-03-07 DIAGNOSIS — M1990 Unspecified osteoarthritis, unspecified site: Secondary | ICD-10-CM | POA: Insufficient documentation

## 2016-03-07 DIAGNOSIS — M722 Plantar fascial fibromatosis: Secondary | ICD-10-CM | POA: Insufficient documentation

## 2016-03-07 HISTORY — DX: Cervicalgia: M54.2

## 2016-03-07 HISTORY — PX: PLANTAR FASCIA RELEASE: SHX2239

## 2016-03-07 HISTORY — DX: Headache, unspecified: R51.9

## 2016-03-07 HISTORY — DX: Headache: R51

## 2016-03-07 SURGERY — FASCIOTOMY, PLANTAR, ENDOSCOPIC
Anesthesia: Regional | Laterality: Left | Wound class: Clean

## 2016-03-07 MED ORDER — LIDOCAINE HCL (CARDIAC) 20 MG/ML IV SOLN
INTRAVENOUS | Status: DC | PRN
  Administered 2016-03-07: 40 mg via INTRATRACHEAL

## 2016-03-07 MED ORDER — OXYCODONE HCL 5 MG/5ML PO SOLN: 5.0000 mg | Freq: Once | ORAL | Status: DC | PRN

## 2016-03-07 MED ORDER — OXYCODONE HCL 5 MG PO TABS: 5.0000 mg | ORAL_TABLET | Freq: Once | ORAL | Status: DC | PRN

## 2016-03-07 MED ORDER — FENTANYL CITRATE (PF) 100 MCG/2ML IJ SOLN
INTRAMUSCULAR | Status: DC | PRN
  Administered 2016-03-07: 100 ug via INTRAVENOUS

## 2016-03-07 MED ORDER — ONDANSETRON HCL 4 MG/2ML IJ SOLN
INTRAMUSCULAR | Status: DC | PRN
  Administered 2016-03-07: 4 mg via INTRAVENOUS

## 2016-03-07 MED ORDER — ROPIVACAINE HCL 5 MG/ML IJ SOLN
INTRAMUSCULAR | Status: DC | PRN
  Administered 2016-03-07: 200 mg via PERINEURAL

## 2016-03-07 MED ORDER — PROPOFOL 10 MG/ML IV BOLUS
INTRAVENOUS | Status: DC | PRN
  Administered 2016-03-07: 200 mg via INTRAVENOUS

## 2016-03-07 MED ORDER — HYDROCODONE-ACETAMINOPHEN 5-325 MG PO TABS: 1.0000 | ORAL_TABLET | Freq: Four times a day (QID) | ORAL | Status: DC | PRN

## 2016-03-07 MED ORDER — BUPIVACAINE-EPINEPHRINE (PF) 0.25% -1:200000 IJ SOLN
INTRAMUSCULAR | Status: DC | PRN
  Administered 2016-03-07: 6 mL

## 2016-03-07 MED ORDER — CEFAZOLIN SODIUM-DEXTROSE 2-4 GM/100ML-% IV SOLN
2.0000 g | Freq: Once | INTRAVENOUS | Status: AC
  Administered 2016-03-07: 2 g via INTRAVENOUS

## 2016-03-07 MED ORDER — HYDROMORPHONE HCL 1 MG/ML IJ SOLN: 0.2500 mg | INTRAMUSCULAR | Status: DC | PRN

## 2016-03-07 MED ORDER — GLYCOPYRROLATE 0.2 MG/ML IJ SOLN
INTRAMUSCULAR | Status: DC | PRN
  Administered 2016-03-07: 0.2 mg via INTRAVENOUS

## 2016-03-07 MED ORDER — MIDAZOLAM HCL 2 MG/2ML IJ SOLN
INTRAMUSCULAR | Status: DC | PRN
  Administered 2016-03-07: 2 mg via INTRAVENOUS

## 2016-03-07 MED ORDER — PROMETHAZINE HCL 25 MG/ML IJ SOLN: 6.2500 mg | INTRAMUSCULAR | Status: DC | PRN

## 2016-03-07 MED ORDER — MEPERIDINE HCL 25 MG/ML IJ SOLN: 6.2500 mg | INTRAMUSCULAR | Status: DC | PRN

## 2016-03-07 MED ORDER — LACTATED RINGERS IV SOLN
INTRAVENOUS | Status: DC
  Administered 2016-03-07 (×2): via INTRAVENOUS

## 2016-03-07 MED ORDER — DEXAMETHASONE SODIUM PHOSPHATE 4 MG/ML IJ SOLN
INTRAMUSCULAR | Status: DC | PRN
  Administered 2016-03-07: 4 mg via INTRAVENOUS
  Administered 2016-03-07: 4 mg via PERINEURAL

## 2016-03-07 SURGICAL SUPPLY — 52 items
APPLICATOR COTTON TIP 6IN STRL (MISCELLANEOUS) ×9 IMPLANT
BANDAGE ELASTIC 4 CLIP NS LF (GAUZE/BANDAGES/DRESSINGS) ×3 IMPLANT
BENZOIN TINCTURE PRP APPL 2/3 (GAUZE/BANDAGES/DRESSINGS) IMPLANT
BLADE ENDOTRAC PUSH EPF/EGR (MISCELLANEOUS) IMPLANT
BLADE TRIANGLE EPF/EGR ENDO (BLADE) ×3 IMPLANT
BNDG ESMARK 4X12 TAN STRL LF (GAUZE/BANDAGES/DRESSINGS) ×3 IMPLANT
BNDG GAUZE 4.5X4.1 6PLY STRL (MISCELLANEOUS) ×3 IMPLANT
BNDG STRETCH 4X75 STRL LF (GAUZE/BANDAGES/DRESSINGS) ×3 IMPLANT
CANISTER SUCT 1200ML W/VALVE (MISCELLANEOUS) ×3 IMPLANT
CHLORAPREP W/TINT 26ML (MISCELLANEOUS) IMPLANT
CLOSURE WOUND 1/4X4 (GAUZE/BANDAGES/DRESSINGS)
COVER LIGHT HANDLE UNIVERSAL (MISCELLANEOUS) ×6 IMPLANT
CUFF TOURN SGL QUICK 18 (TOURNIQUET CUFF) IMPLANT
CUFF TOURN SGL QUICK 24 (TOURNIQUET CUFF)
CUFF TRNQT CYL 24X4X40X1 (TOURNIQUET CUFF) IMPLANT
GAUZE PETRO XEROFOAM 1X8 (MISCELLANEOUS) IMPLANT
GAUZE PETRO XEROFOAM 5X9 (MISCELLANEOUS) ×3 IMPLANT
GAUZE SPONGE 4X4 12PLY STRL (GAUZE/BANDAGES/DRESSINGS) IMPLANT
GLOVE BIO SURGEON STRL SZ8 (GLOVE) ×3 IMPLANT
GOWN STRL REUS W/ TWL LRG LVL3 (GOWN DISPOSABLE) ×1 IMPLANT
GOWN STRL REUS W/ TWL XL LVL3 (GOWN DISPOSABLE) ×1 IMPLANT
GOWN STRL REUS W/TWL LRG LVL3 (GOWN DISPOSABLE) ×2
GOWN STRL REUS W/TWL XL LVL3 (GOWN DISPOSABLE) ×2
IV NS 250ML (IV SOLUTION) ×2
IV NS 250ML BAXH (IV SOLUTION) ×1 IMPLANT
K-WIRE DBL END TROCAR 6X.062 (WIRE)
KIT ROOM TURNOVER OR (KITS) ×3 IMPLANT
KWIRE DBL END TROCAR 6X.062 (WIRE) IMPLANT
NEEDLE 18GX1X1/2 (RX/OR ONLY) (NEEDLE) IMPLANT
NEEDLE HYPO 25GX1X1/2 BEV (NEEDLE) IMPLANT
NS IRRIG 500ML POUR BTL (IV SOLUTION) ×3 IMPLANT
PACK EXTREMITY ARMC (MISCELLANEOUS) ×3 IMPLANT
PAD GROUND ADULT SPLIT (MISCELLANEOUS) ×3 IMPLANT
SOL ANTI-FOG 6CC FOG-OUT (MISCELLANEOUS) IMPLANT
SOL FOG-OUT ANTI-FOG 6CC (MISCELLANEOUS)
SPLINT CAST 1 STEP 4X30 (MISCELLANEOUS) IMPLANT
STOCKINETTE STRL 6IN 960660 (GAUZE/BANDAGES/DRESSINGS) ×3 IMPLANT
STRAP BODY AND KNEE 60X3 (MISCELLANEOUS) ×3 IMPLANT
STRIP CLOSURE SKIN 1/4X4 (GAUZE/BANDAGES/DRESSINGS) IMPLANT
SUT ETHILON 4-0 (SUTURE)
SUT ETHILON 4-0 FS2 18XMFL BLK (SUTURE)
SUT ETHILON 5-0 FS-2 18 BLK (SUTURE) ×3 IMPLANT
SUT MNCRL 4-0 (SUTURE)
SUT MNCRL 4-0 27XMFL (SUTURE)
SUT VIC AB 4-0 FS2 27 (SUTURE) IMPLANT
SUT VICRYL AB 3-0 FS1 BRD 27IN (SUTURE) IMPLANT
SUTURE ETHLN 4-0 FS2 18XMF BLK (SUTURE) IMPLANT
SUTURE MNCRL 4-0 27XMF (SUTURE) IMPLANT
SYR 5ML LL (SYRINGE) IMPLANT
SYRINGE 10CC LL (SYRINGE) ×3 IMPLANT
WAND TENDON TOPAZ 0 ANGL (MISCELLANEOUS) IMPLANT
WAND TOPAZ MICRO DEBRIDER (MISCELLANEOUS) ×3 IMPLANT

## 2016-03-07 NOTE — Anesthesia Preprocedure Evaluation (Signed)
Anesthesia Evaluation  Patient identified by MRN, date of birth, ID band Patient awake    Reviewed: Allergy & Precautions, NPO status , Patient's Chart, lab work & pertinent test results  Airway Mallampati: II  TM Distance: >3 FB Neck ROM: Full    Dental no notable dental hx.    Pulmonary neg pulmonary ROS,    Pulmonary exam normal        Cardiovascular hypertension, Normal cardiovascular exam     Neuro/Psych  Headaches, negative psych ROS   GI/Hepatic Neg liver ROS, GERD  Medicated and Controlled,  Endo/Other  negative endocrine ROS  Renal/GU negative Renal ROS  negative genitourinary   Musculoskeletal  (+) Arthritis , Osteoarthritis,    Abdominal   Peds  Hematology  (+) anemia ,   Anesthesia Other Findings   Reproductive/Obstetrics negative OB ROS                             Anesthesia Physical Anesthesia Plan  ASA: II  Anesthesia Plan: General and Regional   Post-op Pain Management:  Regional for Post-op pain   Induction: Intravenous  Airway Management Planned: LMA  Additional Equipment:   Intra-op Plan:   Post-operative Plan:   Informed Consent: I have reviewed the patients History and Physical, chart, labs and discussed the procedure including the risks, benefits and alternatives for the proposed anesthesia with the patient or authorized representative who has indicated his/her understanding and acceptance.     Plan Discussed with: CRNA  Anesthesia Plan Comments:         Anesthesia Quick Evaluation

## 2016-03-07 NOTE — Op Note (Signed)
Operative note   Surgeon: Dr. Recardo Evangelist, DPM.    Assistant: None     Preop diagnosis: Chronic plantar fasciitis left heel    Postop diagnosis: Same    Procedure:   1. Partial plantar fascial release with endoscopic procedure   2. Topaz fasciotomy percutaneously       EBL: Less than 5 cc    Anesthesia:general with a popliteal block delivered by the anesthesia team    Hemostasis: Ankle tourniquet for 15 minutes at 250 mmHg pressure    Specimen: None    Complications: None    Operative indications: Chronic heel pain for over a year. Multiple conservative treatments which helped for a while but gradually became nonresponsive to these conservative measures.    Procedure:  Patient was brought into the OR and placed on the operating table in thesupine position. After anesthesia was obtained theright lower extremity was prepped and draped in usual sterile fashion.  Operative Report: This time attention was directed to the medial right heel a 22-gauge needle was used to probe and find the advancing edge of the medial calcaneal tubercle. Once this was established and a was measured 1.2 mm distal this and a mark was made. A 1 cm incision was made on this region deepened with blunt dissection inferior to the plantar fascial ligament. The blunt dissection was achieved to the lateral edge of plantar fascial ligament and a secondary incision was made on the lateral heel to allow for exit of the probe and trocar on the region. This time the cannula trocar combination was inserted in the region pressed up against the plantar fascial ligament inferiorly and carried from medial to lateral out the lateral exit incision. The trocar was removed the cannula was position so that the opening slot would be up against the plantar fascial ligament. The camera was introduced to the medial aspect and good visualization of the plantar fascial ligament was achieved. At this point the ligament was scan its  entirety and evaluated in the medial third central third lateral third. The medial third was targeted for partial release. At this time a triangular blade was introduced laterally and carried all the way to the medial aspect of the plantar fascial ligament. Extension to the digits was achieved by the surgical tech the ligament was visualized medially and released to the medial one third with good visualization of the underlying muscle tissue. Once this was accomplished the ligament was palpated and not nearly as tight as it was preoperatively. The cannula was removed and the area was copiously irrigated. Prior to the patient getting completely numb , and area was mapped out on the plantar heel where the most intense pain was experienced by the patient. 16 individual marks were placed across the region in a grid pattern across the painful area. This point a 0.062 K wire was used to make percutaneous holes in these areas and the Topaz wand was introduced and the wand was placed up against the plantar fascial ligament and ablated on each of these individual holes.  This time the tourniquet was released and prompt complete vascularity was seen to return to the left foot. There was allowed to bleed for short period of time and bleeding slowed appropriately. At this time the medial lateral incisions were closed with 5-0 nylon simple interrupted sutures. The area was then blocked with 0.25% Marcaine plain and 1 part 200,000 epinephrine. Next a sterile compressive dressings placed across the wounds consisting of Xeroform gauze 4 x 4's  Kling and Kerlix. A CAM walking boot was placed on the left foot and lower leg in the operating room.    Patient tolerated the procedure and anesthesia well.  Was transported from the OR to the PACU with all vital signs stable and vascular status intact. To be discharged per routine protocol.  Will follow up in approximately 1 week in the outpatient clinic.

## 2016-03-07 NOTE — Transfer of Care (Signed)
Immediate Anesthesia Transfer of Care Note  Patient: [name]  Procedure(s) Performed: Procedure(s) with comments: ENDOSCOPIC PLANTAR FASCIOTOMY RELEASE LEFT FOOT (Left) - LMA WITH POPLITEAL TOPAZ  Patient Location: PACU  Anesthesia Type: General, Regional  Level of Consciousness: awake, alert  and patient cooperative  Airway and Oxygen Therapy: Patient Spontanous Breathing and Patient connected to supplemental oxygen  Post-op Assessment: Post-op Vital signs reviewed, Patient's Cardiovascular Status Stable, Respiratory Function Stable, Patent Airway and No signs of Nausea or vomiting  Post-op Vital Signs: Reviewed and stable  Complications: No apparent anesthesia complications

## 2016-03-07 NOTE — Anesthesia Procedure Notes (Addendum)
Anesthesia Regional Block:  Popliteal block  Pre-Anesthetic Checklist: ,, timeout performed, Correct Patient, Correct Site, Correct Laterality, Correct Procedure, Correct Position, site marked, Risks and benefits discussed,  Surgical consent,  Pre-op evaluation,  At surgeon's request and post-op pain management  Laterality: Left  Prep: chloraprep       Needles:  Injection technique: Single-shot  Needle Type: Echogenic Needle     Needle Length: 9cm 9 cm Needle Gauge: 21 and 21 G    Additional Needles:  Procedures: ultrasound guided (picture in chart) Popliteal block Narrative:  Start time: 03/07/2016 7:09 AM End time: 03/07/2016 7:17 AM Injection made incrementally with aspirations every 5 mL.  Performed by: Personally  Anesthesiologist: Harolyn RutherfordFRIEDMAN, JOSHUA  Additional Notes: Functioning IV was confirmed and monitors applied. Ultrasound guidance: relevant anatomy identified, needle position confirmed, local anesthetic spread visualized around nerve(s)., vascular puncture avoided.  Image printed for medical record.  Negative aspiration and no paresthesias; incremental administration of local anesthetic. The patient tolerated the procedure well. Vitals signes recorded in RN notes.   Procedure Name: LMA Insertion Performed by: Orlin HildingLEBLANC, Lynell Kussman Pre-anesthesia Checklist: Patient identified, Emergency Drugs available, Suction available, Patient being monitored and Timeout performed Patient Re-evaluated:Patient Re-evaluated prior to inductionOxygen Delivery Method: Circle system utilized Preoxygenation: Pre-oxygenation with 100% oxygen Intubation Type: IV induction LMA: LMA inserted LMA Size: 4.0

## 2016-03-07 NOTE — Anesthesia Postprocedure Evaluation (Signed)
Anesthesia Post Note  Patient: [name]  Procedure(s) Performed: Procedure(s) (LRB): 1. Partial plantar fascial release with endoscopic procedure  2. Topaz fasciotomy percutaneously (Left)  Patient location during evaluation: PACU Anesthesia Type: General and Regional Level of consciousness: awake and alert and oriented Pain management: pain level controlled Vital Signs Assessment: post-procedure vital signs reviewed and stable Respiratory status: spontaneous breathing and nonlabored ventilation Cardiovascular status: stable Postop Assessment: no signs of nausea or vomiting and adequate PO intake Anesthetic complications: no    Harolyn RutherfordJoshua Rashan Rounsaville

## 2016-03-07 NOTE — H&P (Signed)
H and P has been reviewed and no changes are noted.  

## 2016-03-08 ENCOUNTER — Inpatient Hospital Stay: Payer: BC Managed Care – PPO

## 2016-03-08 ENCOUNTER — Encounter: Payer: Self-pay | Admitting: Podiatry

## 2016-03-08 ENCOUNTER — Inpatient Hospital Stay: Payer: BC Managed Care – PPO | Admitting: Internal Medicine

## 2016-03-12 ENCOUNTER — Other Ambulatory Visit: Payer: BC Managed Care – PPO

## 2016-03-13 ENCOUNTER — Inpatient Hospital Stay: Payer: BC Managed Care – PPO

## 2016-03-13 DIAGNOSIS — D509 Iron deficiency anemia, unspecified: Secondary | ICD-10-CM

## 2016-03-13 DIAGNOSIS — Z9884 Bariatric surgery status: Secondary | ICD-10-CM | POA: Diagnosis not present

## 2016-03-13 DIAGNOSIS — Z79899 Other long term (current) drug therapy: Secondary | ICD-10-CM | POA: Diagnosis not present

## 2016-03-13 LAB — CBC WITH DIFFERENTIAL/PLATELET
BASOS ABS: 0 10*3/uL (ref 0–0.1)
Basophils Relative: 0 %
EOS PCT: 3 %
Eosinophils Absolute: 0.2 10*3/uL (ref 0–0.7)
HCT: 39.5 % (ref 35.0–47.0)
Hemoglobin: 13.4 g/dL (ref 12.0–16.0)
LYMPHS PCT: 24 %
Lymphs Abs: 1.8 10*3/uL (ref 1.0–3.6)
MCH: 31.6 pg (ref 26.0–34.0)
MCHC: 33.8 g/dL (ref 32.0–36.0)
MCV: 93.3 fL (ref 80.0–100.0)
MONO ABS: 0.7 10*3/uL (ref 0.2–0.9)
Monocytes Relative: 9 %
Neutro Abs: 4.7 10*3/uL (ref 1.4–6.5)
Neutrophils Relative %: 64 %
PLATELETS: 219 10*3/uL (ref 150–440)
RBC: 4.23 MIL/uL (ref 3.80–5.20)
RDW: 13.3 % (ref 11.5–14.5)
WBC: 7.4 10*3/uL (ref 3.6–11.0)

## 2016-03-13 LAB — FERRITIN: Ferritin: 85 ng/mL (ref 11–307)

## 2016-03-14 ENCOUNTER — Telehealth: Payer: Self-pay | Admitting: *Deleted

## 2016-03-14 DIAGNOSIS — D509 Iron deficiency anemia, unspecified: Secondary | ICD-10-CM

## 2016-03-14 NOTE — Telephone Encounter (Signed)
Called patient to inform her that she does not need an iron infusion at this time.  Scheduling will call her to set up an appointment for 3 months with NP.  Verbalized understanding.

## 2016-03-14 NOTE — Telephone Encounter (Signed)
-----   Message from Earna CoderGovinda R Brahmanday, MD sent at 03/14/2016  7:44 AM EDT ----- No IV iron needed; cbc/iron studies/ ferritin in 3 months/ follow up with NP. THx

## 2016-03-18 ENCOUNTER — Other Ambulatory Visit: Payer: Self-pay | Admitting: Orthopedic Surgery

## 2016-03-18 DIAGNOSIS — M4722 Other spondylosis with radiculopathy, cervical region: Secondary | ICD-10-CM

## 2016-04-05 ENCOUNTER — Ambulatory Visit
Admission: RE | Admit: 2016-04-05 | Discharge: 2016-04-05 | Disposition: A | Discharge status: Home / Self Care | Payer: BC Managed Care – PPO | Source: Ambulatory Visit | Attending: Orthopedic Surgery | Admitting: Orthopedic Surgery

## 2016-04-05 ENCOUNTER — Other Ambulatory Visit: Payer: Self-pay | Admitting: Nurse Practitioner

## 2016-04-05 DIAGNOSIS — M47812 Spondylosis without myelopathy or radiculopathy, cervical region: Secondary | ICD-10-CM | POA: Insufficient documentation

## 2016-04-05 DIAGNOSIS — R1319 Other dysphagia: Secondary | ICD-10-CM

## 2016-04-05 DIAGNOSIS — M4722 Other spondylosis with radiculopathy, cervical region: Secondary | ICD-10-CM | POA: Insufficient documentation

## 2016-04-05 DIAGNOSIS — M4802 Spinal stenosis, cervical region: Secondary | ICD-10-CM | POA: Diagnosis not present

## 2016-04-09 ENCOUNTER — Ambulatory Visit: Payer: BC Managed Care – PPO

## 2016-04-12 ENCOUNTER — Ambulatory Visit
Admission: RE | Admit: 2016-04-12 | Discharge: 2016-04-12 | Disposition: A | Discharge status: Home / Self Care | Payer: BC Managed Care – PPO | Source: Ambulatory Visit | Attending: Nurse Practitioner | Admitting: Nurse Practitioner

## 2016-04-12 DIAGNOSIS — R1319 Other dysphagia: Secondary | ICD-10-CM | POA: Diagnosis present

## 2016-04-12 DIAGNOSIS — K219 Gastro-esophageal reflux disease without esophagitis: Secondary | ICD-10-CM | POA: Diagnosis not present

## 2016-04-12 DIAGNOSIS — K449 Diaphragmatic hernia without obstruction or gangrene: Secondary | ICD-10-CM | POA: Insufficient documentation

## 2016-04-12 DIAGNOSIS — Z8719 Personal history of other diseases of the digestive system: Secondary | ICD-10-CM | POA: Insufficient documentation

## 2016-04-17 ENCOUNTER — Encounter: Payer: Self-pay | Admitting: Nurse Practitioner

## 2016-05-10 ENCOUNTER — Encounter: Payer: Self-pay | Admitting: *Deleted

## 2016-05-13 ENCOUNTER — Ambulatory Visit
Admission: RE | Admit: 2016-05-13 | Discharge: 2016-05-13 | Disposition: A | Discharge status: Home / Self Care | Payer: BC Managed Care – PPO | Source: Ambulatory Visit | Attending: Gastroenterology | Admitting: Gastroenterology

## 2016-05-13 ENCOUNTER — Ambulatory Visit: Payer: BC Managed Care – PPO | Admitting: Anesthesiology

## 2016-05-13 ENCOUNTER — Encounter: Payer: Self-pay | Admitting: *Deleted

## 2016-05-13 ENCOUNTER — Encounter
Admission: RE | Disposition: A | Discharge status: Home / Self Care | Payer: Self-pay | Source: Ambulatory Visit | Attending: Gastroenterology

## 2016-05-13 DIAGNOSIS — K449 Diaphragmatic hernia without obstruction or gangrene: Secondary | ICD-10-CM | POA: Insufficient documentation

## 2016-05-13 DIAGNOSIS — Z7982 Long term (current) use of aspirin: Secondary | ICD-10-CM | POA: Diagnosis not present

## 2016-05-13 DIAGNOSIS — K2950 Unspecified chronic gastritis without bleeding: Secondary | ICD-10-CM | POA: Diagnosis not present

## 2016-05-13 DIAGNOSIS — Z86010 Personal history of colonic polyps: Secondary | ICD-10-CM | POA: Insufficient documentation

## 2016-05-13 DIAGNOSIS — Z9889 Other specified postprocedural states: Secondary | ICD-10-CM | POA: Insufficient documentation

## 2016-05-13 DIAGNOSIS — K219 Gastro-esophageal reflux disease without esophagitis: Secondary | ICD-10-CM | POA: Diagnosis present

## 2016-05-13 DIAGNOSIS — Z1211 Encounter for screening for malignant neoplasm of colon: Secondary | ICD-10-CM | POA: Insufficient documentation

## 2016-05-13 DIAGNOSIS — Z791 Long term (current) use of non-steroidal anti-inflammatories (NSAID): Secondary | ICD-10-CM | POA: Diagnosis not present

## 2016-05-13 DIAGNOSIS — Z8719 Personal history of other diseases of the digestive system: Secondary | ICD-10-CM | POA: Diagnosis not present

## 2016-05-13 DIAGNOSIS — M1990 Unspecified osteoarthritis, unspecified site: Secondary | ICD-10-CM | POA: Diagnosis not present

## 2016-05-13 DIAGNOSIS — I10 Essential (primary) hypertension: Secondary | ICD-10-CM | POA: Diagnosis not present

## 2016-05-13 DIAGNOSIS — Z9884 Bariatric surgery status: Secondary | ICD-10-CM | POA: Diagnosis not present

## 2016-05-13 DIAGNOSIS — K210 Gastro-esophageal reflux disease with esophagitis: Secondary | ICD-10-CM | POA: Insufficient documentation

## 2016-05-13 DIAGNOSIS — R12 Heartburn: Secondary | ICD-10-CM | POA: Diagnosis not present

## 2016-05-13 DIAGNOSIS — F419 Anxiety disorder, unspecified: Secondary | ICD-10-CM | POA: Diagnosis not present

## 2016-05-13 DIAGNOSIS — Z79899 Other long term (current) drug therapy: Secondary | ICD-10-CM | POA: Insufficient documentation

## 2016-05-13 HISTORY — PX: COLONOSCOPY WITH PROPOFOL: SHX5780

## 2016-05-13 HISTORY — DX: Anxiety disorder, unspecified: F41.9

## 2016-05-13 HISTORY — PX: ESOPHAGOGASTRODUODENOSCOPY (EGD) WITH PROPOFOL: SHX5813

## 2016-05-13 SURGERY — COLONOSCOPY WITH PROPOFOL
Anesthesia: General

## 2016-05-13 MED ORDER — LIDOCAINE HCL (CARDIAC) 20 MG/ML IV SOLN
INTRAVENOUS | Status: DC | PRN
  Administered 2016-05-13: 100 mg via INTRAVENOUS

## 2016-05-13 MED ORDER — SODIUM CHLORIDE 0.9 % IV SOLN: INTRAVENOUS | Status: DC

## 2016-05-13 MED ORDER — GLYCOPYRROLATE 0.2 MG/ML IJ SOLN
INTRAMUSCULAR | Status: DC | PRN
  Administered 2016-05-13: 0.2 mg via INTRAVENOUS

## 2016-05-13 MED ORDER — SODIUM CHLORIDE 0.9 % IV SOLN
INTRAVENOUS | Status: DC
Start: 2016-05-13 — End: 2016-05-13
  Administered 2016-05-13: 09:00:00 via INTRAVENOUS

## 2016-05-13 MED ORDER — PROPOFOL 10 MG/ML IV BOLUS
INTRAVENOUS | Status: DC | PRN
  Administered 2016-05-13: 100 mg via INTRAVENOUS
  Administered 2016-05-13: 50 mg via INTRAVENOUS

## 2016-05-13 MED ORDER — PROPOFOL 500 MG/50ML IV EMUL
INTRAVENOUS | Status: DC | PRN
  Administered 2016-05-13: 150 ug/kg/min via INTRAVENOUS

## 2016-05-13 NOTE — Op Note (Signed)
[facility] Patient Name: [name] Procedure Date: 05/13/2016 9:55 AM MRN: [id number] Account #: [id number] Date of Birth: Sep 05, 1961 Admit Type: Outpatient Age: 55 [facility] Gender: Female Note Status: Finalized Procedure:            Colonoscopy Indications:          Personal history of colonic polyps Providers:            Christena DeemMartin U. Skulskie, MD Referring MD:         Rhona LeavensJames F. Burnett ShengHedrick, MD (Referring MD) Medicines:            Monitored Anesthesia Care Complications:        No immediate complications. Procedure:            Pre-Anesthesia Assessment:                       - ASA Grade Assessment: III - A patient with severe                        systemic disease.                       After obtaining informed consent, the colonoscope was                        passed under direct vision. Throughout the procedure,                        the patient's blood pressure, pulse, and oxygen                        saturations were monitored continuously. The                        Colonoscope was introduced through the anus with the                        intention of advancing to the cecum. The scope was                        advanced to the splenic flexure before the procedure                        was aborted. Medications were given. The colonoscopy                        was unusually difficult due to poor bowel prep. Findings:      The sigmoid colon, descending colon, mid descending colon and distal       descending colon appeared normal. More proximally, there is a mucoid       coating that could not be rinsed, this precludes evaluation. . Impression:           - The sigmoid colon, descending colon, mid descending                        colon and distal descending colon are normal.                       - No specimens collected. Recommendation:       - Discharge patient  to home.                       - repeat prep and  reschedule Procedure Code(s):    --- Professional ---                       [id number], 53, Colonoscopy, flexible; diagnostic, including                        collection of specimen(s) by brushing or washing, when                        performed (separate procedure) Diagnosis Code(s):    --- Professional ---                       Z86.010, Personal history of colonic polyps CPT copyright 2016 [employer] Medical Association. All rights reserved. The codes documented in this report are preliminary and upon coder review may  be revised to meet current compliance requirements. Christena DeemMartin U Skulskie, MD 05/13/2016 10:50:48 AM This report has been signed electronically. Number of Addenda: 0 Note Initiated On: 05/13/2016 9:55 AM Total Procedure Duration: 0 hours 6 minutes 44 seconds       [facility]

## 2016-05-13 NOTE — Transfer of Care (Signed)
Immediate Anesthesia Transfer of Care Note  Patient: [name]  Procedure(s) Performed: Procedure(s): COLONOSCOPY WITH PROPOFOL (N/A) ESOPHAGOGASTRODUODENOSCOPY (EGD) WITH PROPOFOL (N/A)  Patient Location: Endoscopy Unit  Anesthesia Type:General  Level of Consciousness: awake and alert   Airway & Oxygen Therapy: Patient Spontanous Breathing  Post-op Assessment: Report given to RN and Post -op Vital signs reviewed and stable  Post vital signs: Reviewed and stable  Last Vitals:  Vitals:   05/13/16 0855  BP: (!) 157/92  Pulse: 67  Resp: 16  Temp: 36.1 C    Last Pain:  Vitals:   05/13/16 0855  TempSrc: Tympanic         Complications: No apparent anesthesia complications

## 2016-05-13 NOTE — Anesthesia Preprocedure Evaluation (Signed)
Anesthesia Evaluation  Patient identified by MRN, date of birth, ID band Patient awake    Reviewed: Allergy & Precautions, H&P , NPO status , Patient's Chart, lab work & pertinent test results, reviewed documented beta blocker date and time   History of Anesthesia Complications (+) history of anesthetic complications  Airway Mallampati: II  TM Distance: >3 FB Neck ROM: full    Dental no notable dental hx. (+) Caps, Teeth Intact   Pulmonary neg pulmonary ROS,    Pulmonary exam normal breath sounds clear to auscultation       Cardiovascular Exercise Tolerance: Good hypertension, On Medications (-) angina(-) CAD, (-) Past MI, (-) Cardiac Stents and (-) CABG Normal cardiovascular exam(-) dysrhythmias (-) Valvular Problems/Murmurs Rhythm:regular Rate:Normal     Neuro/Psych negative neurological ROS  negative psych ROS   GI/Hepatic Neg liver ROS, GERD  ,  Endo/Other  negative endocrine ROS  Renal/GU negative Renal ROS  negative genitourinary   Musculoskeletal   Abdominal   Peds  Hematology  (+) Blood dyscrasia, anemia ,   Anesthesia Other Findings Past Medical History: No date: Anemia No date: Anxiety No date: Arthritis     Comment: hands, fingers, lower back No date: Complication of anesthesia     Comment: loss control of bowels after block No date: GERD (gastroesophageal reflux disease) No date: Headache     Comment: hx of migraines in past No date: Hypertension 03/17/2015: IDA (iron deficiency anemia) 03/17/2015: IDA (iron deficiency anemia) No date: Neck pain     Comment: s/p fall approx 1 month ago.  Transient.               "Random"   Reproductive/Obstetrics negative OB ROS                             Anesthesia Physical Anesthesia Plan  ASA: II  Anesthesia Plan: General   Post-op Pain Management:    Induction:   Airway Management Planned:   Additional Equipment:    Intra-op Plan:   Post-operative Plan:   Informed Consent: I have reviewed the patients History and Physical, chart, labs and discussed the procedure including the risks, benefits and alternatives for the proposed anesthesia with the patient or authorized representative who has indicated his/her understanding and acceptance.   Dental Advisory Given  Plan Discussed with: Anesthesiologist, CRNA and Surgeon  Anesthesia Plan Comments:         Anesthesia Quick Evaluation

## 2016-05-13 NOTE — H&P (Signed)
Outpatient short stay form Pre-procedure 05/13/2016 9:46 AM Amber DeemMartin U Amber Garlick MD  Primary Physician: Dr. Jerl MinaJames Hedrick  Reason for visit:  EGD and colonoscopy  History of present illness:  Patient is a 55 year old female presenting today with multiple GI issues. She does have a history of reflux however this has been getting worse for her lately. She has increased her PPI to twice a day, omeprazole 40 mg twice a day, with some benefit. She did have a barium swallow done that showed no evidence of stricture or esophagitis however there was a moderate amount of gastroesophageal reflux and a moderate sized nonreducible hiatal hernia. 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  He does take meloxicam daily however she does not take any other aspirin or NSAID products. She takes no blood thinners.  Current Facility-Administered Medications:  .  0.9 %  sodium chloride infusion, , Intravenous, Continuous, Amber DeemMartin U Krikor Willet, MD, Last Rate: 20 mL/hr at 05/13/16 0911 .  0.9 %  sodium chloride infusion, , Intravenous, Continuous, Amber DeemMartin U Durell Lofaso, MD  Prescriptions Prior to Admission  Medication Sig Dispense Refill Last Dose  . aspirin 81 MG tablet Take 81 mg by mouth daily.   Past Week at Unknown time  . gabapentin (NEURONTIN) 600 MG tablet Take 600 mg by mouth at bedtime.   Past Week at Unknown time  . losartan (COZAAR) 25 MG tablet Take 50 mg by mouth daily.    05/13/2016 at Unknown time  . Multiple Vitamin (MULTIVITAMIN) tablet Take 1 tablet by mouth daily.   Past Week at Unknown time  . Omega-3 Fatty Acids (FISH OIL) 1200 MG CAPS Take by mouth.   Past Week at Unknown time  . omeprazole (PRILOSEC) 40 MG capsule Take 40 mg by mouth daily.   05/12/2016 at Unknown time  . sertraline (ZOLOFT) 50 MG tablet 1/2 po qhs,if tolerates,increase to 1 po qhs after 1 week   Past Week at Unknown time  . Vitamin D, Ergocalciferol, (DRISDOL) 50000 UNITS CAPS capsule  TAKE 1 CAPSULE (50,000 UNITS TOTAL) BY MOUTH ONCE A WEEK.  12 Past Week at Unknown time  . acetaminophen (TYLENOL) 650 MG CR tablet Take 650 mg by mouth every 8 (eight) hours as needed for pain.   03/06/2016 at Unknown time  . co-enzyme Q-10 30 MG capsule Take 30 mg by mouth 3 (three) times daily.   Past Week at Unknown time  . HYDROcodone-acetaminophen (NORCO) 5-325 MG tablet Take 1 tablet by mouth every 6 (six) hours as needed for moderate pain. 30 tablet 0      No Known Allergies   Past Medical History:  Diagnosis Date  . Anemia   . Anxiety   . Arthritis    hands, fingers, lower back  . Complication of anesthesia    loss control of bowels after block  . GERD (gastroesophageal reflux disease)   . Headache    hx of migraines in past  . Hypertension   . IDA (iron deficiency anemia) 03/17/2015  . IDA (iron deficiency anemia) 03/17/2015  . Neck pain    s/p fall approx 1 month ago.  Transient. "Random"    Review of systems:      Physical Exam    Heart and lungs: Regular rate and rhythm without rub or gallop, lungs are bilaterally clear.    HEENT: Normocephalic atraumatic eyes are anicteric    Other:     Pertinant exam for procedure:  Soft nontender nondistended  bowel sounds positive normoactive.    Planned proceedures: EGD, colonoscopy and indicated procedures. I have discussed the risks benefits and complications of procedures to include not limited to bleeding, infection, perforation and the risk of sedation and the patient wishes to proceed.    Amber DeemMartin U Rhian Funari, MD Gastroenterology 05/13/2016  9:46 AM

## 2016-05-13 NOTE — Brief Op Note (Signed)
Colonoscopy complete to splenic flexure due to poor prep

## 2016-05-13 NOTE — Anesthesia Postprocedure Evaluation (Signed)
Anesthesia Post Note  Patient: Wray Kearnslizabeth Lynne Biondolillo  Procedure(s) Performed: Procedure(s) (LRB): COLONOSCOPY WITH PROPOFOL (N/A) ESOPHAGOGASTRODUODENOSCOPY (EGD) WITH PROPOFOL (N/A)  Patient location during evaluation: Endoscopy Anesthesia Type: General Level of consciousness: awake and alert Pain management: pain level controlled Vital Signs Assessment: post-procedure vital signs reviewed and stable Respiratory status: spontaneous breathing, nonlabored ventilation, respiratory function stable and patient connected to nasal cannula oxygen Cardiovascular status: blood pressure returned to baseline and stable Postop Assessment: no signs of nausea or vomiting Anesthetic complications: no    Last Vitals:  Vitals:   05/13/16 1054 05/13/16 1104  BP: (!) 143/95 (!) 151/94  Pulse: (!) 54 (!) 55  Resp: 14 11  Temp:      Last Pain:  Vitals:   05/13/16 1034  TempSrc: Tympanic                 Lenard SimmerAndrew Nino Amano

## 2016-05-13 NOTE — Op Note (Signed)
[facility] Gastroenterology Patient Name: [name] Procedure Date: 05/13/2016 9:46 AM MRN: [id number] Account #: [id number] Date of Birth: [birth year] Admit Type: Outpatient Age: [age] Room: [facility] ENDO ROOM 4 Gender: Female Note Status: Finalized Procedure:            Upper GI endoscopy Indications:          Heartburn, Gastro-esophageal reflux disease Providers:            Christena DeemMartin U. Skulskie, MD Referring MD:         Rhona LeavensJames F. Burnett ShengHedrick, MD (Referring MD) Medicines:            Monitored Anesthesia Care Complications:        No immediate complications. Procedure:            Pre-Anesthesia Assessment:                       - ASA Grade Assessment: III - A patient with severe                        systemic disease.                       After obtaining informed consent, the endoscope was                        passed under direct vision. Throughout the procedure,                        the patient's blood pressure, pulse, and oxygen                        saturations were monitored continuously. The Endoscope                        was introduced through the mouth, and advanced to the                        jejunum. The patient tolerated the procedure well. Findings:      The Z-line was variable and was found 32 cm from the incisors.      Diffuse mildly erythematous mucosa with bleeding on contact was found in       the cardia and in the gastric body. Biopsies were taken with a cold       forceps for histology.      Evidence of a gastric bypass was found. A gastric pouch with a greater       than 10 cm length from the GE junction to the gastrojejunal anastomosis       was found containing bile. The gastrojejunal anastomosis was       characterized by erythema without other finding. This was traversed. The       pouch-to-jejunum limb was characterized by healthy appearing mucosa. The       duodenum-to-jejunum limb was examined for a short distance.      A 4 cm  hiatal hernia was present. Impression:           - Z-line variable, 32 cm from the incisors.                       - Erythematous mucosa in the cardia and gastric body.  Biopsied.                       - Gastric bypass with a pouch greater than 10 cm in                        length. Gastrojejunal anastomosis characterized by                        erythema without other finding. Recommendation:       - Discharge patient to home.                       - Use Protonix (pantoprazole) 40 mg PO daily daily.                       - Use sucralfate tablets 1 gram PO QID.                       - Discharge patient to home. Procedure Code(s):    --- Professional ---                       [phone], Esophagogastroduodenoscopy, flexible, transoral;                        with biopsy, single or multiple Diagnosis Code(s):    --- Professional ---                       K22.8, Other specified diseases of esophagus                       K31.89, Other diseases of stomach and duodenum                       R12, Heartburn                       K21.9, Gastro-esophageal reflux disease without                        esophagitis CPT copyright 2016 American Medical Association. All rights reserved. The codes documented in this report are preliminary and upon coder review may  be revised to meet current compliance requirements. Christena DeemMartin U Skulskie, MD 05/13/2016 10:19:14 AM This report has been signed electronically. Number of Addenda: 0 Note Initiated On: 05/13/2016 9:46 AM      [facility]

## 2016-05-14 ENCOUNTER — Encounter: Payer: Self-pay | Admitting: Gastroenterology

## 2016-05-14 LAB — SURGICAL PATHOLOGY

## 2016-05-20 ENCOUNTER — Encounter
Admission: RE | Disposition: A | Discharge status: Home / Self Care | Payer: Self-pay | Source: Ambulatory Visit | Attending: Gastroenterology

## 2016-05-20 ENCOUNTER — Ambulatory Visit
Admission: RE | Admit: 2016-05-20 | Discharge: 2016-05-20 | Disposition: A | Discharge status: Home / Self Care | Payer: BC Managed Care – PPO | Source: Ambulatory Visit | Attending: Gastroenterology | Admitting: Gastroenterology

## 2016-05-20 ENCOUNTER — Ambulatory Visit: Payer: BC Managed Care – PPO | Admitting: Anesthesiology

## 2016-05-20 DIAGNOSIS — Z1211 Encounter for screening for malignant neoplasm of colon: Secondary | ICD-10-CM | POA: Insufficient documentation

## 2016-05-20 DIAGNOSIS — Z86010 Personal history of colonic polyps: Secondary | ICD-10-CM | POA: Diagnosis present

## 2016-05-20 DIAGNOSIS — M1990 Unspecified osteoarthritis, unspecified site: Secondary | ICD-10-CM | POA: Diagnosis not present

## 2016-05-20 DIAGNOSIS — Z90710 Acquired absence of both cervix and uterus: Secondary | ICD-10-CM | POA: Insufficient documentation

## 2016-05-20 DIAGNOSIS — Z9889 Other specified postprocedural states: Secondary | ICD-10-CM | POA: Insufficient documentation

## 2016-05-20 DIAGNOSIS — Z9049 Acquired absence of other specified parts of digestive tract: Secondary | ICD-10-CM | POA: Insufficient documentation

## 2016-05-20 DIAGNOSIS — Z9884 Bariatric surgery status: Secondary | ICD-10-CM | POA: Diagnosis not present

## 2016-05-20 DIAGNOSIS — K219 Gastro-esophageal reflux disease without esophagitis: Secondary | ICD-10-CM | POA: Diagnosis not present

## 2016-05-20 DIAGNOSIS — Z800 Family history of malignant neoplasm of digestive organs: Secondary | ICD-10-CM | POA: Insufficient documentation

## 2016-05-20 DIAGNOSIS — I10 Essential (primary) hypertension: Secondary | ICD-10-CM | POA: Insufficient documentation

## 2016-05-20 DIAGNOSIS — Q438 Other specified congenital malformations of intestine: Secondary | ICD-10-CM | POA: Diagnosis not present

## 2016-05-20 DIAGNOSIS — Z96642 Presence of left artificial hip joint: Secondary | ICD-10-CM | POA: Insufficient documentation

## 2016-05-20 DIAGNOSIS — Z7982 Long term (current) use of aspirin: Secondary | ICD-10-CM | POA: Insufficient documentation

## 2016-05-20 DIAGNOSIS — F419 Anxiety disorder, unspecified: Secondary | ICD-10-CM | POA: Insufficient documentation

## 2016-05-20 DIAGNOSIS — D509 Iron deficiency anemia, unspecified: Secondary | ICD-10-CM | POA: Insufficient documentation

## 2016-05-20 HISTORY — PX: COLONOSCOPY WITH PROPOFOL: SHX5780

## 2016-05-20 SURGERY — COLONOSCOPY WITH PROPOFOL
Anesthesia: General

## 2016-05-20 MED ORDER — SODIUM CHLORIDE 0.9 % IV SOLN
INTRAVENOUS | Status: DC
  Administered 2016-05-20: 1000 mL via INTRAVENOUS

## 2016-05-20 MED ORDER — FENTANYL CITRATE (PF) 100 MCG/2ML IJ SOLN
INTRAMUSCULAR | Status: DC | PRN
  Administered 2016-05-20: 50 ug via INTRAVENOUS

## 2016-05-20 MED ORDER — SODIUM CHLORIDE 0.9 % IV SOLN: INTRAVENOUS | Status: DC

## 2016-05-20 MED ORDER — PROPOFOL 10 MG/ML IV BOLUS
INTRAVENOUS | Status: DC | PRN
  Administered 2016-05-20: 100 mg via INTRAVENOUS
  Administered 2016-05-20 (×2): 20 mg via INTRAVENOUS

## 2016-05-20 MED ORDER — MIDAZOLAM HCL 5 MG/5ML IJ SOLN
INTRAMUSCULAR | Status: DC | PRN
  Administered 2016-05-20: 1 mg via INTRAVENOUS

## 2016-05-20 MED ORDER — LIDOCAINE 2% (20 MG/ML) 5 ML SYRINGE
INTRAMUSCULAR | Status: DC | PRN
  Administered 2016-05-20: 40 mg via INTRAVENOUS

## 2016-05-20 MED ORDER — PROPOFOL 500 MG/50ML IV EMUL
INTRAVENOUS | Status: DC | PRN
  Administered 2016-05-20: 160 ug/kg/min via INTRAVENOUS

## 2016-05-20 NOTE — Op Note (Signed)
[facility] Gastroenterology Patient Name: [name] Procedure Date: 05/20/2016 12:53 PM MRN: [id number] Account #: [id number] Date of Birth: 06/21/1961 Admit Type: Outpatient Age: 55 Room: [facility] ENDO ROOM 4 Gender: Female Note Status: Finalized Procedure:            Colonoscopy Indications:          Family history of colon cancer in a distant relative,                        Personal history of colonic polyps Providers:            [name], MD Referring MD:         Rhona Leavens. Burnett Sheng, MD (Referring MD) Medicines:            Monitored Anesthesia Care Complications:        No immediate complications. Procedure:            Pre-Anesthesia Assessment:                       - ASA Grade Assessment: III - A patient with severe                        systemic disease.                       After obtaining informed consent, the colonoscope was                        passed under direct vision. Throughout the procedure,                        the patient's blood pressure, pulse, and oxygen                        saturations were monitored continuously. The                        Colonoscope was introduced through the anus and                        advanced to the the cecum, identified by appendiceal                        orifice and ileocecal valve. The colonoscopy was                        performed with moderate difficulty. Successful                        completion of the procedure was aided by changing the                        patient to a supine position and using manual pressure.                        The quality of the bowel preparation was good. Findings:      The sigmoid colon, descending colon and transverse colon were       significantly redundant. Some mild mucosal irritation noted at about       35-40 cm on withdrawal.  The exam was otherwise without abnormality. Impression:           - Redundant colon.                       -  The examination was otherwise normal.                       - No specimens collected. Recommendation:       - Discharge patient to home.                       - Repeat colonoscopy in 5 years for screening purposes. Procedure Code(s):    --- Professional ---                       [phone], Colonoscopy, flexible; diagnostic, including                        collection of specimen(s) by brushing or washing, when                        performed (separate procedure) Diagnosis Code(s):    --- Professional ---                       Z80.0, Family history of malignant neoplasm of                        digestive organs                       Z86.010, Personal history of colonic polyps                       Q43.8, Other specified congenital malformations of                        intestine CPT copyright 2016 American Medical Association. All rights reserved. The codes documented in this report are preliminary and upon coder review may  be revised to meet current compliance requirements. Christena DeemMartin U Alice Burnside, MD 05/20/2016 1:46:00 PM This report has been signed electronically. Number of Addenda: 0 Note Initiated On: 05/20/2016 12:53 PM Scope Withdrawal Time: 0 hours 10 minutes 12 seconds  Total Procedure Duration: 0 hours 28 minutes 43 seconds       [facility]

## 2016-05-20 NOTE — Anesthesia Preprocedure Evaluation (Signed)
Anesthesia Evaluation  Patient identified by MRN, date of birth, ID band Patient awake    Reviewed: Allergy & Precautions, H&P , NPO status , Patient's Chart, lab work & pertinent test results, reviewed documented beta blocker date and time   History of Anesthesia Complications (+) history of anesthetic complications  Airway Mallampati: II   Neck ROM: full    Dental  (+) Poor Dentition   Pulmonary neg pulmonary ROS,    Pulmonary exam normal        Cardiovascular hypertension, negative cardio ROS Normal cardiovascular exam     Neuro/Psych  Headaches, negative neurological ROS  negative psych ROS   GI/Hepatic negative GI ROS, Neg liver ROS, GERD  Medicated,  Endo/Other  negative endocrine ROS  Renal/GU negative Renal ROS  negative genitourinary   Musculoskeletal   Abdominal   Peds  Hematology negative hematology ROS (+) anemia ,   Anesthesia Other Findings Past Medical History: No date: Anemia No date: Anxiety No date: Arthritis     Comment: hands, fingers, lower back No date: Complication of anesthesia     Comment: loss control of bowels after block No date: GERD (gastroesophageal reflux disease) No date: Headache     Comment: hx of migraines in past No date: Hypertension 03/17/2015: IDA (iron deficiency anemia) 03/17/2015: IDA (iron deficiency anemia) No date: Neck pain     Comment: s/p fall approx 1 month ago.  Transient.               "Random" Past Surgical History: 2008: ABDOMINAL HYSTERECTOMY No date: APPENDECTOMY 03/14/14: BACK SURGERY     Comment: L4-5 laminectomy.  Dr Dutch QuintPoole, [facility] No date: CHOLECYSTECTOMY No date: COLONOSCOPY 05/13/2016: COLONOSCOPY WITH PROPOFOL N/A     Comment: Procedure: COLONOSCOPY WITH PROPOFOL;                Surgeon: Christena DeemMartin U Skulskie, MD;  [facility]              [facility];  Service: Endoscopy;  Laterality:               N/A; 05/13/2016: ESOPHAGOGASTRODUODENOSCOPY  (EGD) WITH PROPOFOL N/A     Comment: Procedure: ESOPHAGOGASTRODUODENOSCOPY (EGD)               WITH PROPOFOL;  Surgeon: Christena DeemMartin U Skulskie, MD;              [facility];  Service: Endoscopy;               Laterality: N/A; 2002: GASTRIC BYPASS 12/2013: JOINT REPLACEMENT Left     Comment: [facility] 03/07/2016: PLANTAR FASCIA RELEASE Left     Comment: Procedure: 1. Partial plantar fascial release               with endoscopic procedure  2. Topaz fasciotomy              percutaneously;  Surgeon: Recardo EvangelistMatthew Troxler, DPM;              [facility];  Service:               Podiatry;  Laterality: Left;  LMA WITH               POPLITEAL TOPAZ No date: TONSILLECTOMY BMI    Body Mass Index:  34.95 kg/m     Reproductive/Obstetrics  Anesthesia Physical Anesthesia Plan  ASA: II  Anesthesia Plan: General   Post-op Pain Management:    Induction:   Airway Management Planned:   Additional Equipment:   Intra-op Plan:   Post-operative Plan:   Informed Consent: I have reviewed the patients History and Physical, chart, labs and discussed the procedure including the risks, benefits and alternatives for the proposed anesthesia with the patient or authorized representative who has indicated his/her understanding and acceptance.   Dental Advisory Given  Plan Discussed with: CRNA  Anesthesia Plan Comments:         Anesthesia Quick Evaluation

## 2016-05-20 NOTE — Transfer of Care (Signed)
Immediate Anesthesia Transfer of Care Note  Patient: [name]  Procedure(s) Performed: Procedure(s): COLONOSCOPY WITH PROPOFOL (N/A)  Patient Location: PACU and Endoscopy Unit  Anesthesia Type:General  Level of Consciousness: awake, oriented and patient cooperative  Airway & Oxygen Therapy: Patient Spontanous Breathing and Patient connected to nasal cannula oxygen  Post-op Assessment: Report given to RN and Post -op Vital signs reviewed and stable  Post vital signs: Reviewed and stable  Last Vitals:  Vitals:   05/20/16 1150  BP: (!) 150/100  Pulse: 74  Resp: 16  Temp: 37.1 C    Last Pain:  Vitals:   05/20/16 1150  TempSrc: Tympanic         Complications: No apparent anesthesia complications

## 2016-05-20 NOTE — Anesthesia Postprocedure Evaluation (Signed)
Anesthesia Post Note  Patient: [name]  Procedure(s) Performed: Procedure(s) (LRB): COLONOSCOPY WITH PROPOFOL (N/A)  Patient location during evaluation: PACU Anesthesia Type: General Level of consciousness: awake and alert Pain management: pain level controlled Vital Signs Assessment: post-procedure vital signs reviewed and stable Respiratory status: spontaneous breathing, nonlabored ventilation, respiratory function stable and patient connected to nasal cannula oxygen Cardiovascular status: blood pressure returned to baseline and stable Postop Assessment: no signs of nausea or vomiting Anesthetic complications: no    Last Vitals:  Vitals:   05/20/16 1410 05/20/16 1420  BP: (!) 148/97 (!) 158/84  Pulse: 66 64  Resp: 14 14  Temp:      Last Pain:  Vitals:   05/20/16 1350  TempSrc: Tympanic                 Yevette EdwardsJames G Christapher Gillian

## 2016-05-20 NOTE — H&P (Signed)
Outpatient short stay form Pre-procedure 05/20/2016 1:05 PM Amber DeemMartin U Myles Mallicoat MD  Primary Physician:    Dr. Jerl MinaJames Hedrick  Reason for visit:  Colonoscopy  History of present illness:  Patient is a 55 year old female presenting today for colonoscopy. There is a family history of colon cancer in a distant relative and possible personal history of colon polyps. She presented about a week to 2 weeks ago for colonoscopy however the prep was poor necessitating rescheduling and reprep. She tolerated her prep better this time. He takes no aspirin or blood thinning agents. She has held her 81 mg aspirin.    Current Facility-Administered Medications:  .  0.9 %  sodium chloride infusion, , Intravenous, Continuous, Amber DeemMartin U Eilleen Davoli, MD, Last Rate: 20 mL/hr at 05/20/16 1203, 1,000 mL at 05/20/16 1203 .  0.9 %  sodium chloride infusion, , Intravenous, Continuous, Amber DeemMartin U Adrianne Shackleton, MD  Prescriptions Prior to Admission  Medication Sig Dispense Refill Last Dose  . acetaminophen (TYLENOL) 650 MG CR tablet Take 650 mg by mouth every 8 (eight) hours as needed for pain.   Past Month at Unknown time  . aspirin 81 MG tablet Take 81 mg by mouth daily.   Past Month at Unknown time  . co-enzyme Q-10 30 MG capsule Take 30 mg by mouth 3 (three) times daily.   Past Month at Unknown time  . HYDROcodone-acetaminophen (NORCO) 5-325 MG tablet Take 1 tablet by mouth every 6 (six) hours as needed for moderate pain. 30 tablet 0 Past Month at Unknown time  . losartan (COZAAR) 25 MG tablet Take 50 mg by mouth daily.    05/20/2016 at 0800  . Multiple Vitamin (MULTIVITAMIN) tablet Take 1 tablet by mouth daily.   Past Month at Unknown time  . Omega-3 Fatty Acids (FISH OIL) 1200 MG CAPS Take by mouth.   Past Month at Unknown time  . omeprazole (PRILOSEC) 40 MG capsule Take 40 mg by mouth daily.   Past Month at Unknown time  . sertraline (ZOLOFT) 50 MG tablet 1/2 po qhs,if tolerates,increase to 1 po qhs after 1 week   Past Month at  Unknown time  . Vitamin D, Ergocalciferol, (DRISDOL) 50000 UNITS CAPS capsule TAKE 1 CAPSULE (50,000 UNITS TOTAL) BY MOUTH ONCE A WEEK.  12 Past Month at Unknown time  . gabapentin (NEURONTIN) 600 MG tablet Take 600 mg by mouth at bedtime.   Not Taking at Unknown time     No Known Allergies   Past Medical History:  Diagnosis Date  . Anemia   . Anxiety   . Arthritis    hands, fingers, lower back  . Complication of anesthesia    loss control of bowels after block  . GERD (gastroesophageal reflux disease)   . Headache    hx of migraines in past  . Hypertension   . IDA (iron deficiency anemia) 03/17/2015  . IDA (iron deficiency anemia) 03/17/2015  . Neck pain    s/p fall approx 1 month ago.  Transient. "Random"    Review of systems:      Physical Exam    Heart and lungs: Regular rate and rhythm without rub or gallop, lungs are bilaterally clear.    HEENT: Normocephalic atraumatic eyes are anicteric    Other:     Pertinant exam for procedure: Soft nontender nondistended bowel sounds positive normoactive.    Planned proceedures: Colonoscopy and indicated procedures. I have discussed the risks benefits and complications of procedures to include not limited to bleeding, infection,  perforation and the risk of sedation and the patient wishes to proceed.    Amber Deem, MD Gastroenterology 05/20/2016  1:05 PM

## 2016-06-11 ENCOUNTER — Inpatient Hospital Stay: Payer: BC Managed Care – PPO

## 2016-06-13 ENCOUNTER — Ambulatory Visit: Payer: BC Managed Care – PPO

## 2016-06-13 ENCOUNTER — Ambulatory Visit: Payer: BC Managed Care – PPO | Admitting: Oncology

## 2016-06-13 ENCOUNTER — Ambulatory Visit: Payer: BC Managed Care – PPO | Admitting: Internal Medicine

## 2016-06-14 ENCOUNTER — Other Ambulatory Visit: Payer: Self-pay

## 2016-06-14 ENCOUNTER — Inpatient Hospital Stay: Payer: BC Managed Care – PPO | Attending: Internal Medicine

## 2016-06-14 DIAGNOSIS — D509 Iron deficiency anemia, unspecified: Secondary | ICD-10-CM | POA: Insufficient documentation

## 2016-06-14 DIAGNOSIS — Z79899 Other long term (current) drug therapy: Secondary | ICD-10-CM | POA: Insufficient documentation

## 2016-06-14 DIAGNOSIS — Z9884 Bariatric surgery status: Secondary | ICD-10-CM | POA: Diagnosis not present

## 2016-06-14 LAB — CBC WITH DIFFERENTIAL/PLATELET
BASOS ABS: 0 10*3/uL (ref 0–0.1)
BASOS PCT: 0 %
Eosinophils Absolute: 0.1 10*3/uL (ref 0–0.7)
Eosinophils Relative: 2 %
HEMATOCRIT: 38.4 % (ref 35.0–47.0)
Hemoglobin: 12.9 g/dL (ref 12.0–16.0)
LYMPHS PCT: 24 %
Lymphs Abs: 1.4 10*3/uL (ref 1.0–3.6)
MCH: 30.9 pg (ref 26.0–34.0)
MCHC: 33.7 g/dL (ref 32.0–36.0)
MCV: 91.6 fL (ref 80.0–100.0)
Monocytes Absolute: 0.5 10*3/uL (ref 0.2–0.9)
Monocytes Relative: 8 %
NEUTROS ABS: 3.9 10*3/uL (ref 1.4–6.5)
Neutrophils Relative %: 66 %
PLATELETS: 206 10*3/uL (ref 150–440)
RBC: 4.19 MIL/uL (ref 3.80–5.20)
RDW: 13.2 % (ref 11.5–14.5)
WBC: 5.9 10*3/uL (ref 3.6–11.0)

## 2016-06-14 LAB — IRON AND TIBC
IRON: 86 ug/dL (ref 28–170)
SATURATION RATIOS: 29 % (ref 10.4–31.8)
TIBC: 298 ug/dL (ref 250–450)
UIBC: 212 ug/dL

## 2016-06-14 LAB — FERRITIN: Ferritin: 68 ng/mL (ref 11–307)

## 2016-06-17 ENCOUNTER — Telehealth: Payer: Self-pay | Admitting: *Deleted

## 2016-06-17 DIAGNOSIS — D509 Iron deficiency anemia, unspecified: Secondary | ICD-10-CM

## 2016-06-17 NOTE — Telephone Encounter (Signed)
Informed patient that no IV iron infusion is needed this week.  Patient will return in 6 months for lab- Iron studies/CBC ferritin and to see md and possible Feraheme. Pt understands that labs will be collected 1 week prior to md apt.  Scheduling will contact her with an apt.

## 2016-06-17 NOTE — Telephone Encounter (Signed)
-----   Message from Earna CoderGovinda R Brahmanday, MD sent at 06/17/2016  7:50 AM EDT ----- Iron studies/CBC ferritin in 6 months; probable Feraheme /follow-up with me- 1 week later please inform patient

## 2016-07-04 ENCOUNTER — Encounter: Payer: Self-pay | Admitting: Gastroenterology

## 2016-09-26 ENCOUNTER — Telehealth: Payer: Self-pay | Admitting: *Deleted

## 2016-09-26 ENCOUNTER — Other Ambulatory Visit: Payer: Self-pay | Admitting: Internal Medicine

## 2016-09-26 DIAGNOSIS — E538 Deficiency of other specified B group vitamins: Secondary | ICD-10-CM | POA: Insufficient documentation

## 2016-09-26 DIAGNOSIS — D509 Iron deficiency anemia, unspecified: Secondary | ICD-10-CM

## 2016-09-26 NOTE — Telephone Encounter (Signed)
States she has been getting B 12 injections from the bariatric clinic on a regular basis, but has not had one in past 2 weeks, she is feeling fatigued and feels she needs an injection of B 12, but the clinic is closed and so she is asking if she can come here to get one. Please advise

## 2016-09-26 NOTE — Telephone Encounter (Signed)
Per Dr Donneta RombergBrahmanday, will check B 12 Level and order B 12 inj. Message sent to scheduling. I left message on patient VM to expect call with appt

## 2016-09-27 ENCOUNTER — Inpatient Hospital Stay: Payer: BC Managed Care – PPO | Attending: Internal Medicine

## 2016-09-27 ENCOUNTER — Inpatient Hospital Stay: Payer: BC Managed Care – PPO

## 2016-09-27 ENCOUNTER — Telehealth: Payer: Self-pay

## 2016-09-27 DIAGNOSIS — D509 Iron deficiency anemia, unspecified: Secondary | ICD-10-CM | POA: Diagnosis present

## 2016-09-27 DIAGNOSIS — Z79899 Other long term (current) drug therapy: Secondary | ICD-10-CM | POA: Diagnosis not present

## 2016-09-27 DIAGNOSIS — E538 Deficiency of other specified B group vitamins: Secondary | ICD-10-CM | POA: Diagnosis not present

## 2016-09-27 DIAGNOSIS — Z9884 Bariatric surgery status: Secondary | ICD-10-CM | POA: Insufficient documentation

## 2016-09-27 DIAGNOSIS — D508 Other iron deficiency anemias: Secondary | ICD-10-CM

## 2016-09-27 LAB — VITAMIN B12: Vitamin B-12: 526 pg/mL (ref 180–914)

## 2016-09-27 MED ORDER — CYANOCOBALAMIN 1000 MCG/ML IJ SOLN
1000.0000 ug | Freq: Once | INTRAMUSCULAR | Status: AC
  Administered 2016-09-27: 1000 ug via INTRAMUSCULAR
  Filled 2016-09-27: qty 1

## 2016-09-27 NOTE — Telephone Encounter (Signed)
md asked that patient discuss rx for b12 injections with the bariatric provider.  I personally contacted patient. She stated, "The only reason I was going to the bariatric provider was for wt loss and they discovered I was b12 deficient.  They started giving me the b12 injections in their office. The shots at the bariatric clinic are $15 each. I think it would be more cost efficient for a multivial dose of the b12 injections. Dr. Lorre NickGittin and Dr. Donneta RombergBrahmanday at the cancer center previously treated me for anemia, so that is why I felt it appropriate to ask the cancer center to prescribe the b12 injections. I do have a primary care-it's Dr. Burnett ShengHedrick, but he has never managed this for me. Could you ask Dr. B again given these factors to see if md would be willing to send an prescription for the b12 vials and needles."

## 2016-09-27 NOTE — Telephone Encounter (Signed)
Please advise: Patient is questioning if she can give her own B12 injections at home.  States that she has given herself Lovenox injections in the past so does not need any self injection teaching just needs rx sent to pharmacy.

## 2016-09-30 ENCOUNTER — Other Ambulatory Visit: Payer: Self-pay | Admitting: *Deleted

## 2016-09-30 DIAGNOSIS — E538 Deficiency of other specified B group vitamins: Secondary | ICD-10-CM

## 2016-09-30 MED ORDER — "SYRINGE/NEEDLE (DISP) 27G X 5/8"" 1 ML MISC": 1.0000 | 0 refills | Status: DC

## 2016-09-30 MED ORDER — CYANOCOBALAMIN 1000 MCG/ML IJ SOLN: 1000.0000 ug | Freq: Once | INTRAMUSCULAR | 2 refills | Status: DC

## 2016-09-30 NOTE — Telephone Encounter (Signed)
Spoke with md- agreeable to sending rx for b12 injections/syringe/needles to pt's pharmacy.   Pt aware [facility] [place], [place]. Teach back process performed and reviewed standard practice of self-injection of b12 dosing and using a multivial dosing. Also explained that some pharmacies do not carry a multivial dosing, so pharmacy may only dispense 1ml. she gave verbal understanding.

## 2016-12-13 ENCOUNTER — Inpatient Hospital Stay: Payer: BC Managed Care – PPO | Attending: Internal Medicine

## 2016-12-13 DIAGNOSIS — Z79899 Other long term (current) drug therapy: Secondary | ICD-10-CM | POA: Insufficient documentation

## 2016-12-13 DIAGNOSIS — E538 Deficiency of other specified B group vitamins: Secondary | ICD-10-CM | POA: Insufficient documentation

## 2016-12-13 DIAGNOSIS — D509 Iron deficiency anemia, unspecified: Secondary | ICD-10-CM | POA: Insufficient documentation

## 2016-12-13 DIAGNOSIS — Z9884 Bariatric surgery status: Secondary | ICD-10-CM | POA: Diagnosis not present

## 2016-12-13 LAB — IRON AND TIBC
Iron: 25 ug/dL — ABNORMAL LOW (ref 28–170)
Saturation Ratios: 8 % — ABNORMAL LOW (ref 10.4–31.8)
TIBC: 298 ug/dL (ref 250–450)
UIBC: 273 ug/dL

## 2016-12-13 LAB — CBC WITH DIFFERENTIAL/PLATELET
Basophils Absolute: 0 10*3/uL (ref 0–0.1)
Basophils Relative: 0 %
Eosinophils Absolute: 0.2 10*3/uL (ref 0–0.7)
Eosinophils Relative: 2 %
HEMATOCRIT: 38.1 % (ref 35.0–47.0)
Hemoglobin: 12.8 g/dL (ref 12.0–16.0)
LYMPHS PCT: 11 %
Lymphs Abs: 1.1 10*3/uL (ref 1.0–3.6)
MCH: 31 pg (ref 26.0–34.0)
MCHC: 33.7 g/dL (ref 32.0–36.0)
MCV: 92 fL (ref 80.0–100.0)
MONO ABS: 0.7 10*3/uL (ref 0.2–0.9)
Monocytes Relative: 7 %
NEUTROS ABS: 8.6 10*3/uL — AB (ref 1.4–6.5)
Neutrophils Relative %: 80 %
Platelets: 215 10*3/uL (ref 150–440)
RBC: 4.14 MIL/uL (ref 3.80–5.20)
RDW: 13.3 % (ref 11.5–14.5)
WBC: 10.7 10*3/uL (ref 3.6–11.0)

## 2016-12-13 LAB — FERRITIN: Ferritin: 46 ng/mL (ref 11–307)

## 2016-12-16 ENCOUNTER — Inpatient Hospital Stay: Payer: BC Managed Care – PPO

## 2016-12-23 ENCOUNTER — Encounter: Payer: Self-pay | Admitting: *Deleted

## 2016-12-23 ENCOUNTER — Inpatient Hospital Stay: Payer: BC Managed Care – PPO | Admitting: Internal Medicine

## 2016-12-23 ENCOUNTER — Inpatient Hospital Stay: Payer: BC Managed Care – PPO

## 2016-12-23 NOTE — Progress Notes (Signed)
3rd no show with Dr. Donneta Romberg- However, md asked that our clinic r/s these apts- md/fereheme.  MD is aware that she has missed 3 apts with him. Since pt came for her lab apt on 3/23, he said he would r/s her this one time.

## 2016-12-27 ENCOUNTER — Inpatient Hospital Stay: Payer: BC Managed Care – PPO | Attending: Internal Medicine | Admitting: Internal Medicine

## 2016-12-27 ENCOUNTER — Inpatient Hospital Stay: Payer: BC Managed Care – PPO

## 2016-12-27 DIAGNOSIS — F419 Anxiety disorder, unspecified: Secondary | ICD-10-CM | POA: Insufficient documentation

## 2016-12-27 DIAGNOSIS — E538 Deficiency of other specified B group vitamins: Secondary | ICD-10-CM

## 2016-12-27 DIAGNOSIS — G2581 Restless legs syndrome: Secondary | ICD-10-CM

## 2016-12-27 DIAGNOSIS — Z96642 Presence of left artificial hip joint: Secondary | ICD-10-CM | POA: Insufficient documentation

## 2016-12-27 DIAGNOSIS — D508 Other iron deficiency anemias: Secondary | ICD-10-CM

## 2016-12-27 DIAGNOSIS — Z9884 Bariatric surgery status: Secondary | ICD-10-CM | POA: Diagnosis not present

## 2016-12-27 DIAGNOSIS — K219 Gastro-esophageal reflux disease without esophagitis: Secondary | ICD-10-CM | POA: Insufficient documentation

## 2016-12-27 DIAGNOSIS — Z79899 Other long term (current) drug therapy: Secondary | ICD-10-CM | POA: Diagnosis not present

## 2016-12-27 DIAGNOSIS — D509 Iron deficiency anemia, unspecified: Secondary | ICD-10-CM

## 2016-12-27 DIAGNOSIS — I10 Essential (primary) hypertension: Secondary | ICD-10-CM | POA: Diagnosis not present

## 2016-12-27 DIAGNOSIS — Z7982 Long term (current) use of aspirin: Secondary | ICD-10-CM | POA: Diagnosis not present

## 2016-12-27 DIAGNOSIS — E611 Iron deficiency: Secondary | ICD-10-CM | POA: Insufficient documentation

## 2016-12-27 MED ORDER — "SYRINGE/NEEDLE (DISP) 27G X 5/8"" 1 ML MISC": 1.0000 | 0 refills | Status: DC

## 2016-12-27 MED ORDER — FERUMOXYTOL INJECTION 510 MG/17 ML
510.0000 mg | Freq: Once | INTRAVENOUS | Status: AC
  Administered 2016-12-27: 510 mg via INTRAVENOUS
  Filled 2016-12-27: qty 17

## 2016-12-27 MED ORDER — SODIUM CHLORIDE 0.9 % IV SOLN
Freq: Once | INTRAVENOUS | Status: AC
  Administered 2016-12-27: 15:00:00 via INTRAVENOUS
  Filled 2016-12-27: qty 1000

## 2016-12-27 NOTE — Progress Notes (Signed)
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  Patient complains of moderate fatigue. Complains of pica. Patient's appetite is good. Denies any blood in stools black stools.. No chest pain or shortness of breath. Poor tolerance to iron. Patient states that she has been under a lot of stress because of mother having stroke.   REVIEW OF SYSTEMS:  A complete 10 point review of system is done which is negative except mentioned above/history of present illness.   PAST MEDICAL HISTORY :  Past Medical History:  Diagnosis Date  . Anemia   . Anxiety   . Arthritis    hands, fingers, lower back  . Complication of anesthesia    loss control of bowels after block  . GERD (gastroesophageal reflux disease)   . Headache    hx of migraines in past  . Hypertension   . IDA (iron deficiency anemia) 03/17/2015  . IDA (iron deficiency anemia) 03/17/2015  . Neck pain    s/p fall approx 1 month ago.  Transient. "Random"    PAST SURGICAL HISTORY :   Past Surgical History:  Procedure Laterality Date  . ABDOMINAL HYSTERECTOMY  2008  . APPENDECTOMY    . BACK SURGERY  03/14/14   L4-5 laminectomy.  Dr Dutch Quint, [facility]  . CHOLECYSTECTOMY    . COLONOSCOPY    . COLONOSCOPY WITH PROPOFOL N/A 05/13/2016   Procedure: COLONOSCOPY WITH PROPOFOL;  Surgeon: [name], MD;  Location: [facility] ENDOSCOPY;  Service: Endoscopy;  Laterality: N/A;  . COLONOSCOPY WITH PROPOFOL N/A 05/20/2016   Procedure: COLONOSCOPY WITH PROPOFOL;  Surgeon: [name], MD;  Location:  [facility] ENDOSCOPY;  Service: Endoscopy;  Laterality: N/A;  . ESOPHAGOGASTRODUODENOSCOPY (EGD) WITH PROPOFOL N/A 05/13/2016   Procedure: ESOPHAGOGASTRODUODENOSCOPY (EGD) WITH PROPOFOL;  Surgeon: [name], MD;  Location: [facility] ENDOSCOPY;  Service: Endoscopy;  Laterality: N/A;  . GASTRIC BYPASS  2002  . JOINT REPLACEMENT Left 12/2013   hip replacement at [facility]  . PLANTAR FASCIA RELEASE Left 03/07/2016   Procedure: 1. Partial plantar fascial release with endoscopic procedure  2. Topaz fasciotomy percutaneously;  Surgeon: Recardo Evangelist, DPM;  Location: [facility];  Service: Podiatry;  Laterality: Left;  LMA WITH POPLITEAL TOPAZ  . TONSILLECTOMY      FAMILY HISTORY :  No family history on file.  SOCIAL HISTORY:   Social History  Substance Use Topics  . Smoking status: Never Smoker  . Smokeless tobacco: Never Used  . Alcohol use No    ALLERGIES:  has No Known Allergies.  MEDICATIONS:  Current Outpatient Prescriptions  Medication Sig Dispense Refill  . acetaminophen (TYLENOL) 650 MG CR tablet Take 650 mg by mouth every 8 (eight) hours as needed for pain.    [name] aspirin 81 MG tablet Take 81 mg by mouth daily.    [name] co-enzyme Q-10 30 MG capsule Take 30 mg by mouth 3 (three) times daily.    . cyanocobalamin (,VITAMIN B-12,) 1000 MCG/ML injection     . dextromethorphan-guaiFENesin (MUCINEX DM) 30-600 MG 12hr tablet Take by mouth.    [name]  fexofenadine (ALLEGRA) 180 MG tablet Take by mouth.    . gabapentin (NEURONTIN) 600 MG tablet Take 600 mg by mouth at bedtime.    [name] losartan (COZAAR) 50 MG tablet Take 50 mg by mouth daily.    . meloxicam (MOBIC) 15 MG tablet take 1 tablet by mouth once daily    . Multiple Vitamin (MULTIVITAMIN) tablet Take 1 tablet by mouth daily.    . Omega-3 Fatty Acids (FISH OIL) 1200 MG CAPS Take by mouth.    [name] omeprazole (PRILOSEC) 40 MG capsule Take 40 mg by mouth daily.    . sertraline (ZOLOFT) 50 MG tablet 1/2 po qhs,if tolerates,increase to 1 po qhs after  1 week    . sucralfate (CARAFATE) 1 g tablet Take by mouth.    . Syringe/Needle, Disp, (BD SAFETYGLIDE SYRINGE/NEEDLE) 27G X 5/8" 1 ML MISC 1 Syringe by Does not apply route every 30 (thirty) days. 12 each 0  . Vitamin D, Ergocalciferol, (DRISDOL) 50000 UNITS CAPS capsule TAKE 1 CAPSULE (50,000 UNITS TOTAL) BY MOUTH ONCE A WEEK.  12   No current facility-administered medications for this visit.     PHYSICAL EXAMINATION:  BP (!) 133/92 (BP Location: Left Arm, Patient Position: Sitting)   Pulse 74   Temp 98.2 F (36.8 C) (Tympanic)   Resp 16   Wt 226 lb (102.5 kg)   BMI 37.61 kg/m   Filed Weights   12/27/16 1347  Weight: 226 lb (102.5 kg)    GENERAL: Well-nourished well-developed; Alert, no distress and comfortable.    EYES: no pallor or icterus OROPHARYNX: no thrush or ulceration; good dentition  NECK: supple, no masses felt LYMPH:  no palpable lymphadenopathy in the cervical, axillary or inguinal regions LUNGS: clear to auscultation and  No wheeze or crackles HEART/CVS: regular rate & rhythm and no murmurs; No lower extremity edema ABDOMEN:abdomen soft, non-tender and normal bowel sounds Musculoskeletal:no cyanosis of digits and no clubbing  PSYCH: alert & oriented x 3 with fluent speech NEURO: no focal motor/sensory deficits SKIN:  no rashes or significant lesions  LABORATORY DATA:  I have reviewed the data as listed    Component Value Date/Time   NA 137 08/25/2015 1017   NA 141 01/19/2014 0457   K 3.6 08/25/2015 1017   K 3.7 01/19/2014 0457   CL 104 08/25/2015 1017   CL 106 01/19/2014 0457   CO2 26 08/25/2015 1017   CO2 29 01/19/2014 0457   GLUCOSE 118 (H) 08/25/2015 1017   GLUCOSE 100 (H) 01/19/2014 0457   BUN 15 08/25/2015 1017   BUN 7 01/19/2014 0457   CREATININE 0.69 08/25/2015 1017   CREATININE 0.96 06/28/2014 0910   CALCIUM 8.9 08/25/2015 1017   CALCIUM 8.1 (L) 01/19/2014 0457   PROT 6.9 08/25/2015 1017   ALBUMIN 3.7 08/25/2015 1017   AST 22  08/25/2015 1017   ALT 18 08/25/2015 1017   ALKPHOS 98 08/25/2015 1017   BILITOT 0.5 08/25/2015 1017   GFRNONAA >60 08/25/2015 1017   GFRNONAA >60 06/28/2014 0910   GFRNONAA >60 01/19/2014 0457   GFRAA >60 08/25/2015 1017   GFRAA >60 06/28/2014 0910   GFRAA >60 01/19/2014 0457    No results found for: SPEP, UPEP  Lab Results  Component Value Date   WBC 10.7 12/13/2016   NEUTROABS 8.6 (H) 12/13/2016   HGB 12.8 12/13/2016   HCT 38.1 12/13/2016   MCV 92.0 12/13/2016   PLT 215 12/13/2016      Chemistry  Component Value Date/Time   NA 137 08/25/2015 1017   NA 141 01/19/2014 0457   K 3.6 08/25/2015 1017   K 3.7 01/19/2014 0457   CL 104 08/25/2015 1017   CL 106 01/19/2014 0457   CO2 26 08/25/2015 1017   CO2 29 01/19/2014 0457   BUN 15 08/25/2015 1017   BUN 7 01/19/2014 0457   CREATININE 0.69 08/25/2015 1017   CREATININE 0.96 06/28/2014 0910      Component Value Date/Time   CALCIUM 8.9 08/25/2015 1017   CALCIUM 8.1 (L) 01/19/2014 0457   ALKPHOS 98 08/25/2015 1017   AST 22 08/25/2015 1017   ALT 18 08/25/2015 1017   BILITOT 0.5 08/25/2015 1017       ASSESSMENT & PLAN:   Dietary iron deficiency # 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 Even though patient does not have any anemia. Patient tends to have extreme fatigue/restless leg syndrome from low ferritin levels.; Iron sat- 8%; ferritin 46. Proceed with IV ferrahem today.   # B12 sec to gastric bypass. On B12 IM at home [syringes given]  # follow up in 4 months [pt preference];few days prior- labs/IV ferrahem posisble/ MD.      Earna Coder, MD 12/27/2016 2:22 PM

## 2016-12-27 NOTE — Progress Notes (Signed)
Patient here today for follow up.   

## 2016-12-27 NOTE — Assessment & Plan Note (Signed)
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 Even though patient does not have any anemia. Patient tends to have extreme fatigue/restless leg syndrome from low ferritin levels.; Iron sat- 8%; ferritin 46. Proceed with IV ferrahem today.   # B12 sec to gastric bypass. On B12 IM at home [syringes given]  # follow up in 4 months [pt preference];few days prior- labs/IV ferrahem posisble/ MD.

## 2016-12-30 ENCOUNTER — Encounter: Payer: Self-pay | Admitting: *Deleted

## 2016-12-30 ENCOUNTER — Telehealth: Payer: Self-pay | Admitting: *Deleted

## 2016-12-30 NOTE — Telephone Encounter (Signed)
States she is not sleeping well since treatment Friday and is asking that we write a letter for her employer that she is to work from home until she recovers.

## 2016-12-30 NOTE — Telephone Encounter (Signed)
Pt called back on 1200 asking if letter was written yet. She stated that she had 24 hrs to get her letter to the employer. I explained that her msg was received and that I would speak to the provider regarding her request and notify her back with the md's decision. She would like the letter to be written to states that she will work for home. 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  She thanked me for following up.   Spoke with Dr. Donneta Romberg. approved letter.

## 2016-12-30 NOTE — Telephone Encounter (Signed)
Contacted patient. Informed pt that letter has been written/signed. Pt made aware. RN faxed Letter to attn: [facility] [place] [phone]

## 2017-01-01 ENCOUNTER — Other Ambulatory Visit: Payer: Self-pay | Admitting: Family Medicine

## 2017-01-01 DIAGNOSIS — Z1231 Encounter for screening mammogram for malignant neoplasm of breast: Secondary | ICD-10-CM

## 2017-01-24 ENCOUNTER — Ambulatory Visit
Admission: RE | Admit: 2017-01-24 | Discharge: 2017-01-24 | Disposition: A | Discharge status: Home / Self Care | Payer: BC Managed Care – PPO | Source: Ambulatory Visit | Attending: Family Medicine | Admitting: Family Medicine

## 2017-01-24 DIAGNOSIS — Z1231 Encounter for screening mammogram for malignant neoplasm of breast: Secondary | ICD-10-CM | POA: Diagnosis not present

## 2017-03-05 ENCOUNTER — Telehealth: Payer: Self-pay | Admitting: *Deleted

## 2017-03-05 ENCOUNTER — Inpatient Hospital Stay: Payer: BC Managed Care – PPO | Attending: Internal Medicine

## 2017-03-05 DIAGNOSIS — K219 Gastro-esophageal reflux disease without esophagitis: Secondary | ICD-10-CM | POA: Insufficient documentation

## 2017-03-05 DIAGNOSIS — Z79899 Other long term (current) drug therapy: Secondary | ICD-10-CM | POA: Diagnosis not present

## 2017-03-05 DIAGNOSIS — Z9884 Bariatric surgery status: Secondary | ICD-10-CM | POA: Diagnosis not present

## 2017-03-05 DIAGNOSIS — Z7982 Long term (current) use of aspirin: Secondary | ICD-10-CM | POA: Insufficient documentation

## 2017-03-05 DIAGNOSIS — F419 Anxiety disorder, unspecified: Secondary | ICD-10-CM | POA: Diagnosis not present

## 2017-03-05 DIAGNOSIS — I10 Essential (primary) hypertension: Secondary | ICD-10-CM | POA: Diagnosis not present

## 2017-03-05 DIAGNOSIS — G2581 Restless legs syndrome: Secondary | ICD-10-CM | POA: Diagnosis not present

## 2017-03-05 DIAGNOSIS — D509 Iron deficiency anemia, unspecified: Secondary | ICD-10-CM | POA: Insufficient documentation

## 2017-03-05 DIAGNOSIS — Z96642 Presence of left artificial hip joint: Secondary | ICD-10-CM | POA: Diagnosis not present

## 2017-03-05 LAB — FERRITIN: FERRITIN: 113 ng/mL (ref 11–307)

## 2017-03-05 LAB — CBC
HEMATOCRIT: 39.7 % (ref 35.0–47.0)
HEMOGLOBIN: 13.5 g/dL (ref 12.0–16.0)
MCH: 31.3 pg (ref 26.0–34.0)
MCHC: 33.9 g/dL (ref 32.0–36.0)
MCV: 92.4 fL (ref 80.0–100.0)
Platelets: 225 10*3/uL (ref 150–440)
RBC: 4.3 MIL/uL (ref 3.80–5.20)
RDW: 13.4 % (ref 11.5–14.5)
WBC: 6.7 10*3/uL (ref 3.6–11.0)

## 2017-03-05 LAB — IRON AND TIBC
Iron: 62 ug/dL (ref 28–170)
SATURATION RATIOS: 22 % (ref 10.4–31.8)
TIBC: 283 ug/dL (ref 250–450)
UIBC: 221 ug/dL

## 2017-03-05 NOTE — Telephone Encounter (Signed)
Dr. Orlie DakinFinnegan- pt normally gets cbc, ferr, iibc.  We could make her a lab only if Dr. Orlie DakinFinnegan is ok with this. Not sch. To f/u with Dr. Leonard SchwartzB until August.

## 2017-03-05 NOTE — Telephone Encounter (Signed)
Per VO Dr Orlie DakinFinnegan, CBC, IIBC, Ferr ordered. 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

## 2017-03-05 NOTE — Telephone Encounter (Signed)
-----   Message from Riccardo Dubiniana H Scruggs sent at 03/05/2017  9:50 AM EDT ----- Regarding: LABS??? Contact: [phone] PT L/M that shes not feeling well and has an appt end of July-she asked if she should come in for labs and let him look at the results???-thx

## 2017-03-05 NOTE — Telephone Encounter (Signed)
Patient of Dr Donneta RombergBrahmanday who has IDA and B12 deficiency asking to have her labs checked because she is feeling weak. Please advise

## 2017-03-06 ENCOUNTER — Telehealth: Payer: Self-pay | Admitting: *Deleted

## 2017-03-06 NOTE — Telephone Encounter (Signed)
Per Dr Orlie DakinFinnegan she does not need treatment. Left message on her VM that she does not need iron and to contact PCP if she feels that bad to see if there is other causes for it

## 2017-03-06 NOTE — Telephone Encounter (Signed)
Asking for results from yesterday and states she would like to come in for tx tomorrow. Her labs look nml to me. Please advise.  Iron and TIBC  Order: [id number]  Status:  Final result  Visible to patient:  No (Not Released)  Next appt:  04/25/2017 at 02:00 PM in Oncology ([facility])  Dx:  Iron deficiency anemia, unspecified i...   Ref Range & Units 1d ago  Iron 28 - 170 ug/dL 62   TIBC 409250 - 811450 ug/dL 914283   Saturation Ratios 10.4 - 31.8 % 22   UIBC ug/dL 782221   Resulting Agency  [facility]    Specimen Collected: 03/05/17 14:10  Last Resulted: 03/05/17 15:20                   Other Results from 03/05/2017   CBC  Order: [id number]   Status:  Final result  Visible to patient:  No (Not Released)  Next appt:  04/25/2017 at 02:00 PM in Oncology ([facility])  Dx:  Iron deficiency anemia, unspecified i...   Ref Range & Units 1d ago  WBC 3.6 - 11.0 K/uL 6.7   RBC 3.80 - 5.20 MIL/uL 4.30   Hemoglobin 12.0 - 16.0 g/dL 57.813.5   HCT 46.935.0 - 62.947.0 % 39.7   MCV 80.0 - 100.0 fL 92.4   MCH 26.0 - 34.0 pg 31.3   MCHC 32.0 - 36.0 g/dL 52.833.9   RDW 41.311.5 - 24.414.5 % 13.4   Platelets 150 - 440 K/uL 225   Resulting Agency  [facility]    Specimen Collected: 03/05/17 14:10  Last Resulted: 03/05/17 14:19                     Ferritin  Order: [id number]   Status:  Final result  Visible to patient:  No (Not Released)  Next appt:  04/25/2017 at 02:00 PM in Oncology ([facility])  Dx:  Iron deficiency anemia, unspecified i...   Ref Range & Units 1d ago  Ferritin 11 - 307 ng/mL 113   Resulting Agency  [facility]    Specimen Collected: 03/05/17 14:10  Last Resulted: 03/05/17 15:20

## 2017-04-25 ENCOUNTER — Inpatient Hospital Stay: Payer: BC Managed Care – PPO | Attending: Internal Medicine

## 2017-04-25 ENCOUNTER — Other Ambulatory Visit: Payer: Self-pay

## 2017-04-25 DIAGNOSIS — Z96642 Presence of left artificial hip joint: Secondary | ICD-10-CM | POA: Insufficient documentation

## 2017-04-25 DIAGNOSIS — I10 Essential (primary) hypertension: Secondary | ICD-10-CM | POA: Diagnosis not present

## 2017-04-25 DIAGNOSIS — K219 Gastro-esophageal reflux disease without esophagitis: Secondary | ICD-10-CM | POA: Insufficient documentation

## 2017-04-25 DIAGNOSIS — E611 Iron deficiency: Secondary | ICD-10-CM

## 2017-04-25 DIAGNOSIS — Z79899 Other long term (current) drug therapy: Secondary | ICD-10-CM | POA: Insufficient documentation

## 2017-04-25 DIAGNOSIS — G2581 Restless legs syndrome: Secondary | ICD-10-CM | POA: Insufficient documentation

## 2017-04-25 DIAGNOSIS — F419 Anxiety disorder, unspecified: Secondary | ICD-10-CM | POA: Diagnosis not present

## 2017-04-25 DIAGNOSIS — D509 Iron deficiency anemia, unspecified: Secondary | ICD-10-CM | POA: Diagnosis not present

## 2017-04-25 DIAGNOSIS — Z7982 Long term (current) use of aspirin: Secondary | ICD-10-CM | POA: Diagnosis not present

## 2017-04-25 DIAGNOSIS — Z9884 Bariatric surgery status: Secondary | ICD-10-CM | POA: Diagnosis not present

## 2017-04-25 LAB — IRON AND TIBC
Iron: 52 ug/dL (ref 28–170)
SATURATION RATIOS: 20 % (ref 10.4–31.8)
TIBC: 262 ug/dL (ref 250–450)
UIBC: 210 ug/dL

## 2017-04-25 LAB — CBC WITH DIFFERENTIAL/PLATELET
BASOS PCT: 0 %
Basophils Absolute: 0 10*3/uL (ref 0–0.1)
EOS ABS: 0.3 10*3/uL (ref 0–0.7)
Eosinophils Relative: 5 %
HCT: 36.9 % (ref 35.0–47.0)
HEMOGLOBIN: 12.5 g/dL (ref 12.0–16.0)
Lymphocytes Relative: 33 %
Lymphs Abs: 1.7 10*3/uL (ref 1.0–3.6)
MCH: 31.8 pg (ref 26.0–34.0)
MCHC: 33.8 g/dL (ref 32.0–36.0)
MCV: 94 fL (ref 80.0–100.0)
MONO ABS: 0.3 10*3/uL (ref 0.2–0.9)
MONOS PCT: 7 %
NEUTROS PCT: 55 %
Neutro Abs: 2.8 10*3/uL (ref 1.4–6.5)
Platelets: 197 10*3/uL (ref 150–440)
RBC: 3.92 MIL/uL (ref 3.80–5.20)
RDW: 13.2 % (ref 11.5–14.5)
WBC: 5.1 10*3/uL (ref 3.6–11.0)

## 2017-04-25 LAB — FERRITIN: Ferritin: 122 ng/mL (ref 11–307)

## 2017-05-02 ENCOUNTER — Inpatient Hospital Stay: Payer: BC Managed Care – PPO

## 2017-05-02 ENCOUNTER — Inpatient Hospital Stay: Payer: BC Managed Care – PPO | Admitting: Internal Medicine

## 2017-05-22 ENCOUNTER — Telehealth: Payer: Self-pay | Admitting: *Deleted

## 2017-05-22 NOTE — Telephone Encounter (Signed)
Dr Sherryll BurgerShah did a nerve conduction study yesterday and said she definitely has peripheral neuropathy. He said he thinks it is coming from her vitamin B12 deficiency and anemia is causing it. She states she gives herself B12 inj at home and takes gabapentin, she is asking if she needs more B12 or if there are any supplements she can take that would help with it. Please advise  She cancelled her 8/10 appointment and stated she would call back to reschedule it, but has not at this point in time

## 2017-05-22 NOTE — Telephone Encounter (Signed)
Per Dr B, she can try Acupuncture. Patient informed.and will look into this option

## 2017-05-30 ENCOUNTER — Other Ambulatory Visit: Payer: Self-pay | Admitting: Internal Medicine

## 2017-06-02 ENCOUNTER — Inpatient Hospital Stay: Payer: BC Managed Care – PPO | Admitting: Internal Medicine

## 2017-07-07 ENCOUNTER — Other Ambulatory Visit: Payer: Self-pay | Admitting: Internal Medicine

## 2017-07-07 DIAGNOSIS — E538 Deficiency of other specified B group vitamins: Secondary | ICD-10-CM

## 2017-07-23 ENCOUNTER — Ambulatory Visit: Payer: BC Managed Care – PPO | Admitting: Physical Therapy

## 2017-10-16 ENCOUNTER — Telehealth: Payer: Self-pay | Admitting: *Deleted

## 2017-10-16 NOTE — Telephone Encounter (Signed)
t reporting that she does not have an appointment and feels she needs to be checked. I called her back and let her know that she does have an appointment next Friday 2/1 @ 115 for lab/ doctor/ iron. She thanked me for letting her know and states she will be here. We discussed her getting the my chart app for hewr phone to check appts and she said she will definitely do that

## 2017-10-17 ENCOUNTER — Telehealth: Payer: Self-pay | Admitting: *Deleted

## 2017-10-17 DIAGNOSIS — D508 Other iron deficiency anemias: Secondary | ICD-10-CM

## 2017-10-17 DIAGNOSIS — E538 Deficiency of other specified B group vitamins: Secondary | ICD-10-CM

## 2017-10-17 DIAGNOSIS — E611 Iron deficiency: Secondary | ICD-10-CM

## 2017-10-17 NOTE — Telephone Encounter (Signed)
Md spoke with patient. Pt reassured by MD that he has checked the b12 in the past and the profile was within normal limits. Md reassured pt to keep the apt next Friday as scheduled. She receives b12 injections at home. md would like to order a cbc, metc, iron studies, ferritin and b12 level. Labs entered per md order.

## 2017-10-17 NOTE — Telephone Encounter (Signed)
Patient called requesting that Dr Arma HeadingBrahmandays' nurse call her back. 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 She has questions she would like to discuss. She had called yesterday stating she did not have an appointment and was feeling poor and  I called her back and told her she is scheduled for next Friday. Please return her call. [phone]

## 2017-10-24 ENCOUNTER — Inpatient Hospital Stay: Payer: BC Managed Care – PPO | Attending: Internal Medicine | Admitting: Internal Medicine

## 2017-10-24 ENCOUNTER — Inpatient Hospital Stay: Payer: BC Managed Care – PPO

## 2017-10-24 ENCOUNTER — Encounter: Payer: Self-pay | Admitting: Internal Medicine

## 2017-10-24 VITALS — BP 115/80 | HR 60 | Temp 96.0°F | Resp 18

## 2017-10-24 DIAGNOSIS — E538 Deficiency of other specified B group vitamins: Secondary | ICD-10-CM | POA: Diagnosis not present

## 2017-10-24 DIAGNOSIS — Z9884 Bariatric surgery status: Secondary | ICD-10-CM | POA: Insufficient documentation

## 2017-10-24 DIAGNOSIS — G629 Polyneuropathy, unspecified: Secondary | ICD-10-CM

## 2017-10-24 DIAGNOSIS — D508 Other iron deficiency anemias: Secondary | ICD-10-CM

## 2017-10-24 DIAGNOSIS — E611 Iron deficiency: Secondary | ICD-10-CM | POA: Diagnosis not present

## 2017-10-24 LAB — CBC WITH DIFFERENTIAL/PLATELET
BASOS ABS: 0 10*3/uL (ref 0–0.1)
BASOS PCT: 1 %
EOS PCT: 3 %
Eosinophils Absolute: 0.2 10*3/uL (ref 0–0.7)
HEMATOCRIT: 37.6 % (ref 35.0–47.0)
Hemoglobin: 12.3 g/dL (ref 12.0–16.0)
LYMPHS PCT: 22 %
Lymphs Abs: 1.4 10*3/uL (ref 1.0–3.6)
MCH: 30.4 pg (ref 26.0–34.0)
MCHC: 32.6 g/dL (ref 32.0–36.0)
MCV: 93.2 fL (ref 80.0–100.0)
MONO ABS: 0.4 10*3/uL (ref 0.2–0.9)
Monocytes Relative: 7 %
NEUTROS ABS: 4.3 10*3/uL (ref 1.4–6.5)
Neutrophils Relative %: 67 %
PLATELETS: 227 10*3/uL (ref 150–440)
RBC: 4.03 MIL/uL (ref 3.80–5.20)
RDW: 13.6 % (ref 11.5–14.5)
WBC: 6.4 10*3/uL (ref 3.6–11.0)

## 2017-10-24 LAB — COMPREHENSIVE METABOLIC PANEL
ALBUMIN: 3.7 g/dL (ref 3.5–5.0)
ALT: 19 U/L (ref 14–54)
AST: 20 U/L (ref 15–41)
Alkaline Phosphatase: 97 U/L (ref 38–126)
Anion gap: 7 (ref 5–15)
BUN: 16 mg/dL (ref 6–20)
CHLORIDE: 105 mmol/L (ref 101–111)
CO2: 26 mmol/L (ref 22–32)
CREATININE: 0.56 mg/dL (ref 0.44–1.00)
Calcium: 8.6 mg/dL — ABNORMAL LOW (ref 8.9–10.3)
GFR calc Af Amer: >60 mL/min (ref >60)
GLUCOSE: 110 mg/dL — AB (ref 65–99)
POTASSIUM: 4.1 mmol/L (ref 3.5–5.1)
Sodium: 138 mmol/L (ref 135–145)
Total Bilirubin: 0.6 mg/dL (ref 0.3–1.2)
Total Protein: 6.8 g/dL (ref 6.5–8.1)

## 2017-10-24 LAB — IRON AND TIBC
Iron: 99 ug/dL (ref 28–170)
Saturation Ratios: 37 % — ABNORMAL HIGH (ref 10.4–31.8)
TIBC: 267 ug/dL (ref 250–450)
UIBC: 168 ug/dL

## 2017-10-24 LAB — VITAMIN B12: Vitamin B-12: 2727 pg/mL — ABNORMAL HIGH (ref 180–914)

## 2017-10-24 LAB — FERRITIN: FERRITIN: 90 ng/mL (ref 11–307)

## 2017-10-24 MED ORDER — SODIUM CHLORIDE 0.9 % IV SOLN
Freq: Once | INTRAVENOUS | Status: AC
  Administered 2017-10-24: 14:00:00 via INTRAVENOUS
  Filled 2017-10-24: qty 1000

## 2017-10-24 MED ORDER — SODIUM CHLORIDE 0.9 % IV SOLN
510.0000 mg | Freq: Once | INTRAVENOUS | Status: AC
  Administered 2017-10-24: 510 mg via INTRAVENOUS
  Filled 2017-10-24: qty 17

## 2017-10-24 NOTE — Assessment & Plan Note (Addendum)
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 Even though patient does not have any anemia.  Patient symptoms improve post IV and infusion.  #  Patient tends to have extreme fatigue/restless leg syndrome from low ferritin levels.;cbc-normal;  Proceed with IV ferrahem today.   # B12 sec to gastric bypass. On B12 IM at home [syringes given]  # Motor Neuropathy- ? Etiology; unlikely B12 deficiency as patient has been on B12 supplementation I am for long time.  Followed by Dr.Shah.   # follow up in 4 months [pt preference];few days prior- labs/IV ferrahem posisble/ MD.

## 2017-10-24 NOTE — Progress Notes (Signed)
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   Patient states that she has been diagnosed with 'motor neuropathy' as per neurology.  Patient questions if B12 deficiency is causing the neuropathy.  Patient complains of moderate fatigue. Patient's appetite is good. Denies any blood in stools black stools.. No chest pain or shortness of breath. Poor tolerance to iron.   REVIEW OF SYSTEMS:  A complete 10 point review of system is done which is negative except mentioned above/history of present illness.   PAST MEDICAL HISTORY :  Past Medical History:  Diagnosis Date  . Anemia   . Anxiety   . Arthritis    hands, fingers, lower back  . Complication of anesthesia    loss control of bowels after block  . GERD (gastroesophageal reflux disease)   . Headache    hx of migraines in past  . Hypertension   . IDA (iron deficiency anemia) 03/17/2015  . IDA (iron deficiency anemia) 03/17/2015  . Neck pain    s/p fall approx 1 month ago.  Transient. "Random"    PAST SURGICAL HISTORY :   Past Surgical History:  Procedure Laterality Date  . ABDOMINAL HYSTERECTOMY  2008  . APPENDECTOMY    . BACK SURGERY  03/14/14   L4-5 laminectomy.  Dr Dutch QuintPoole, [facility]  . CHOLECYSTECTOMY    . COLONOSCOPY    . COLONOSCOPY WITH PROPOFOL N/A 05/13/2016   Procedure: COLONOSCOPY WITH PROPOFOL;  Surgeon: Christena DeemMartin U Skulskie, MD;  [facility];  Service: Endoscopy;  Laterality: N/A;  . COLONOSCOPY WITH PROPOFOL N/A 05/20/2016   Procedure: COLONOSCOPY WITH  PROPOFOL;  Surgeon: Christena DeemMartin U Skulskie, MD;  [facility];  Service: Endoscopy;  Laterality: N/A;  . ESOPHAGOGASTRODUODENOSCOPY (EGD) WITH PROPOFOL N/A 05/13/2016   Procedure: ESOPHAGOGASTRODUODENOSCOPY (EGD) WITH PROPOFOL;  Surgeon: Christena DeemMartin U Skulskie, MD;  [facility];  Service: Endoscopy;  Laterality: N/A;  . GASTRIC BYPASS  2002  . JOINT REPLACEMENT Left 12/2013   [facility]  . PLANTAR FASCIA RELEASE Left 03/07/2016   Procedure: 1. Partial plantar fascial release with endoscopic procedure  2. Topaz fasciotomy percutaneously;  Surgeon: Recardo EvangelistMatthew Troxler, DPM;  [facility];  Service: Podiatry;  Laterality: Left;  LMA WITH POPLITEAL TOPAZ  . TONSILLECTOMY      FAMILY HISTORY :   Family History  Problem Relation Age of Onset  . Breast cancer Neg Hx     SOCIAL HISTORY:   Social History   Tobacco Use  . Smoking status: Never Smoker  . Smokeless tobacco: Never Used  Substance Use Topics  . Alcohol use: No  . Drug use: No    ALLERGIES:  has No Known Allergies.  MEDICATIONS:  Current Outpatient Medications  Medication Sig Dispense Refill  . aspirin 81 MG tablet Take 81 mg by mouth daily.    [name]. co-enzyme Q-10 30 MG capsule Take 30 mg by mouth 3 (three) times daily.    . cyanocobalamin (,VITAMIN B-12,) 1000 MCG/ML injection     . dextromethorphan-guaiFENesin (MUCINEX DM) 30-600 MG  12hr tablet Take by mouth.    . fexofenadine (ALLEGRA) 180 MG tablet Take by mouth.    . gabapentin (NEURONTIN) 600 MG tablet Take 600 mg by mouth at bedtime.    [name] losartan (COZAAR) 50 MG tablet Take 50 mg by mouth daily.    . meloxicam (MOBIC) 15 MG tablet take 1 tablet by mouth once daily    . Multiple Vitamin (MULTIVITAMIN) tablet Take 1 tablet by mouth daily.    . Omega-3 Fatty Acids (FISH OIL) 1200 MG CAPS Take by mouth.    [name] omeprazole (PRILOSEC) 40 MG capsule Take 40 mg by mouth daily.    . sertraline (ZOLOFT) 50 MG tablet 1/2 po qhs,if  tolerates,increase to 1 po qhs after 1 week    . Syringe/Needle, Disp, (BD SAFETYGLIDE SYRINGE/NEEDLE) 27G X 5/8" 1 ML MISC 1 Syringe by Does not apply route every 30 (thirty) days. 12 each 0  . Vitamin D, Ergocalciferol, (DRISDOL) 50000 UNITS CAPS capsule TAKE 1 CAPSULE (50,000 UNITS TOTAL) BY MOUTH ONCE A WEEK.  12  . acetaminophen (TYLENOL) 650 MG CR tablet Take 650 mg by mouth every 8 (eight) hours as needed for pain.    [name] sucralfate (CARAFATE) 1 g tablet Take by mouth.     No current facility-administered medications for this visit.     PHYSICAL EXAMINATION:  There were no vitals taken for this visit.  There were no vitals filed for this visit.  GENERAL: Well-nourished well-developed; Alert, no distress and comfortable.    EYES: no pallor or icterus OROPHARYNX: no thrush or ulceration; good dentition  NECK: supple, no masses felt LYMPH:  no palpable lymphadenopathy in the cervical, axillary or inguinal regions LUNGS: clear to auscultation and  No wheeze or crackles HEART/CVS: regular rate & rhythm and no murmurs; No lower extremity edema ABDOMEN:abdomen soft, non-tender and normal bowel sounds Musculoskeletal:no cyanosis of digits and no clubbing  PSYCH: alert & oriented x 3 with fluent speech NEURO: no focal motor/sensory deficits SKIN:  no rashes or significant lesions  LABORATORY DATA:  I have reviewed the data as listed    Component Value Date/Time   NA 138 10/24/2017 1325   NA 141 01/19/2014 0457   K 4.1 10/24/2017 1325   K 3.7 01/19/2014 0457   CL 105 10/24/2017 1325   CL 106 01/19/2014 0457   CO2 26 10/24/2017 1325   CO2 29 01/19/2014 0457   GLUCOSE 110 (H) 10/24/2017 1325   GLUCOSE 100 (H) 01/19/2014 0457   BUN 16 10/24/2017 1325   BUN 7 01/19/2014 0457   CREATININE 0.56 10/24/2017 1325   CREATININE 0.96 06/28/2014 0910   CALCIUM 8.6 (L) 10/24/2017 1325   CALCIUM 8.1 (L) 01/19/2014 0457   PROT 6.8 10/24/2017 1325   ALBUMIN 3.7 10/24/2017 1325   AST 20  10/24/2017 1325   ALT 19 10/24/2017 1325   ALKPHOS 97 10/24/2017 1325   BILITOT 0.6 10/24/2017 1325   GFRNONAA >60 10/24/2017 1325   GFRNONAA >60 06/28/2014 0910   GFRNONAA >60 01/19/2014 0457   GFRAA >60 10/24/2017 1325   GFRAA >60 06/28/2014 0910   GFRAA >60 01/19/2014 0457    No results found for: SPEP, UPEP  Lab Results  Component Value Date   WBC 6.4 10/24/2017   NEUTROABS 4.3 10/24/2017   HGB 12.3 10/24/2017   HCT 37.6 10/24/2017   MCV 93.2 10/24/2017   PLT 227 10/24/2017      Chemistry      Component Value Date/Time  NA 138 10/24/2017 1325   NA 141 01/19/2014 0457   K 4.1 10/24/2017 1325   K 3.7 01/19/2014 0457   CL 105 10/24/2017 1325   CL 106 01/19/2014 0457   CO2 26 10/24/2017 1325   CO2 29 01/19/2014 0457   BUN 16 10/24/2017 1325   BUN 7 01/19/2014 0457   CREATININE 0.56 10/24/2017 1325   CREATININE 0.96 06/28/2014 0910      Component Value Date/Time   CALCIUM 8.6 (L) 10/24/2017 1325   CALCIUM 8.1 (L) 01/19/2014 0457   ALKPHOS 97 10/24/2017 1325   AST 20 10/24/2017 1325   ALT 19 10/24/2017 1325   BILITOT 0.6 10/24/2017 1325       ASSESSMENT & PLAN:   Dietary iron deficiency # 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 Even though patient does not have any anemia.  Patient symptoms improve post IV and infusion.  #  Patient tends to have extreme fatigue/restless leg syndrome from low ferritin levels.;cbc-normal;  Proceed with IV ferrahem today.   # B12 sec to gastric bypass. On B12 IM at home [syringes given]  # Motor Neuropathy- ? Etiology; unlikely B12 deficiency as patient has been on B12 supplementation I am for long time.  Followed by Dr.Shah.   # follow up in 4 months [pt preference];few days prior- labs/IV ferrahem posisble/ MD.      Earna Coder, MD 10/26/2017 7:43 PM

## 2017-10-24 NOTE — Progress Notes (Signed)
Okay to proceed with Feraheme per MD. 1500: Pt refused to stay 30 minute observation period. Pt and VS stable at discharge.

## 2017-12-22 ENCOUNTER — Encounter: Payer: Self-pay | Admitting: Neurology

## 2017-12-22 ENCOUNTER — Ambulatory Visit: Payer: BC Managed Care – PPO | Admitting: Neurology

## 2017-12-22 VITALS — BP 132/93 | HR 72 | Ht 65.0 in | Wt 210.8 lb

## 2017-12-22 DIAGNOSIS — R202 Paresthesia of skin: Secondary | ICD-10-CM

## 2017-12-22 DIAGNOSIS — M19079 Primary osteoarthritis, unspecified ankle and foot: Secondary | ICD-10-CM | POA: Diagnosis not present

## 2017-12-22 DIAGNOSIS — R269 Unspecified abnormalities of gait and mobility: Secondary | ICD-10-CM | POA: Diagnosis not present

## 2017-12-22 MED ORDER — DICLOFENAC SODIUM 3 % TD GEL: TRANSDERMAL | 11 refills | Status: DC

## 2017-12-22 NOTE — Progress Notes (Signed)
PATIENT: [name] DOB: [birth year]  Chief Complaint  Patient presents with  . Extremity Weakness    She is here with her husband, [name], for second opinion. Reporting left leg weakness, numbness, tingling and pain.  Symptoms causing frequent falls. Recent abnormal NCV/EMG. She uses a cane to assist with walking.   History of left hip replacement and lumbar surgery in 2015.  History of left foot surgery in 2016.  [name] PCP    [name], MD  . Neurology    Lonell Face, MD     HISTORICAL  [name] is a 57 year old female, seen in refer by her previous neurologist Dr. [name], [facility], for evaluation of left leg weakness, frequent, her primary care physician is Dr. [name], initial evaluation was on December 22, 2017.  I have reviewed previous Dr. [name] evaluation October 20, 2017,   She had a history of left hip replacement in April 2015, she presented with left groin area pain, surgery did help her left hip pain, 2 months later in June 2015, she also had lumbar decompression surgery of L4-5 by Dr. Julio Sicks, at that time, she presented with significant low back pain, radiating pain to left anterior thigh. she tends to lean forward.  Postsurgically, she recovered well, was able to ambulate without assistance.  But in early 2017, she began to experience bottom of the left foot worsening pain, was diagnosed with left plantar fasciitis, tried multiple plantar injection, which only helped her symptoms mildly, eventually had surgery for left plantar fasciitis, few months post surgically, after she took off left boot, she noticed left foot numbness, initially involving bottom of her left foot, then gradually involving whole left foot, also noticed mild right plantar foot paresthesia, she complains of left foot pain bearing weight, moderate, fell few times, including one significant incident of falling down steps from the top to the bottom,  Afterwards she was  reevaluated,  MRI of the cervical spine July 2017 showed mild multilevel degenerative disc disease, variable degree of foraminal narrowing but there was no evidence of canal stenosis.  X-ray of left pelvis showed left hip arthroplasty, appears intact, aligned, but there was no acute abnormality.  X-ray of lumbar spine showed postoperative changes at L4 and 5, relative mild osteoarthritic changes in the lower lumbar spine, slight scoliosis, no fracture.  MRI of lumbar spine in October 2015 showed status post L4-5 laminectomy and fusion, central canal and foraminal are widely patent, fluid collection in the laminectomy bed extending posteriorly, mild progression of the disease at L3 and 4, with mild to moderate left foraminal narrowing,  Now she complains of left foot paresthesia, involving whole left foot, difficulty bearing weight, rely on her cane, put weight on her right foot,  EMG/NCS by Dr. 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,  Extensive laboratory evaluation showed normal oral negative 24 hours urine for heavy metal negative for possibly, lead, mercury, Protein electrophoresis, vitamin B12, CBC, ferritin, iron panel, CMP,  She also had a history of bariatric surgery in 2011, with around 200 pound weight loss,  REVIEW OF SYSTEMS: Full 14 system review of systems performed and notable only for chills, fatigue, trouble swallowing, blurry vision, diarrhea, anemia, easy bruising, feeling cold, increased thirst, joint pain, achy muscles, allergy, runny nose, memory loss, numbness, weakness, difficulty swallowing, restless leg, depression, anxiety, decreased energy, disinterested in activities  ALLERGIES: No Known Allergies  HOME MEDICATIONS: Current Outpatient Medications  Medication  Sig Dispense Refill  . acetaminophen (TYLENOL) 650 MG CR tablet Take 650 mg by mouth every 8 (eight) hours as needed for pain.    [name] aspirin 81 MG tablet Take 81 mg by  mouth daily.    [name] co-enzyme Q-10 30 MG capsule Take 30 mg by mouth 3 (three) times daily.    . cyanocobalamin (,VITAMIN B-12,) 1000 MCG/ML injection     . dextromethorphan-guaiFENesin (MUCINEX DM) 30-600 MG 12hr tablet Take by mouth.    . fexofenadine (ALLEGRA) 180 MG tablet Take by mouth.    . gabapentin (NEURONTIN) 600 MG tablet Take 600 mg by mouth at bedtime.    [name] losartan (COZAAR) 50 MG tablet Take 50 mg by mouth daily.    . meloxicam (MOBIC) 15 MG tablet take 1 tablet by mouth once daily    . Multiple Vitamin (MULTIVITAMIN) tablet Take 1 tablet by mouth daily.    . Omega-3 Fatty Acids (FISH OIL) 1200 MG CAPS Take by mouth.    [name] omeprazole (PRILOSEC) 40 MG capsule Take 40 mg by mouth daily.    . sertraline (ZOLOFT) 50 MG tablet 1/2 po qhs,if tolerates,increase to 1 po qhs after 1 week    . Syringe/Needle, Disp, (BD SAFETYGLIDE SYRINGE/NEEDLE) 27G X 5/8" 1 ML MISC 1 Syringe by Does not apply route every 30 (thirty) days. 12 each 0  . Vitamin D, Ergocalciferol, (DRISDOL) 50000 UNITS CAPS capsule TAKE 1 CAPSULE (50,000 UNITS TOTAL) BY MOUTH ONCE A WEEK.  12  . sucralfate (CARAFATE) 1 g tablet Take by mouth.     No current facility-administered medications for this visit.     PAST MEDICAL HISTORY: Past Medical History:  Diagnosis Date  . Anemia   . Anxiety   . Arthritis    hands, fingers, lower back  . Complication of anesthesia    loss control of bowels after block  . GERD (gastroesophageal reflux disease)   . Headache    hx of migraines in past  . Hypertension   . IDA (iron deficiency anemia) 03/17/2015  . IDA (iron deficiency anemia) 03/17/2015  . Neck pain    s/p fall approx 1 month ago.  Transient. "Random"    PAST SURGICAL HISTORY: Past Surgical History:  Procedure Laterality Date  . ABDOMINAL HYSTERECTOMY  2008  . APPENDECTOMY    . BACK SURGERY  03/14/14   L4-5 laminectomy.  Dr Dutch Quint, [facility]  . CHOLECYSTECTOMY    . COLONOSCOPY    . COLONOSCOPY WITH PROPOFOL N/A  05/13/2016   Procedure: COLONOSCOPY WITH PROPOFOL;  Surgeon: [name], MD;  Location: [facility] ENDOSCOPY;  Service: Endoscopy;  Laterality: N/A;  . COLONOSCOPY WITH PROPOFOL N/A 05/20/2016   Procedure: COLONOSCOPY WITH PROPOFOL;  Surgeon: [name], MD;  Location: [facility] ENDOSCOPY;  Service: Endoscopy;  Laterality: N/A;  . ESOPHAGOGASTRODUODENOSCOPY (EGD) WITH PROPOFOL N/A 05/13/2016   Procedure: ESOPHAGOGASTRODUODENOSCOPY (EGD) WITH PROPOFOL;  Surgeon: [name], MD;  Location: [facility] ENDOSCOPY;  Service: Endoscopy;  Laterality: N/A;  . GASTRIC BYPASS  2002  . JOINT REPLACEMENT Left 12/2013   hip replacement [facility]  . PLANTAR FASCIA RELEASE Left 03/07/2016   Procedure: 1. Partial plantar fascial release with endoscopic procedure  2. Topaz fasciotomy percutaneously;  Surgeon: Recardo Evangelist, DPM;  [facility];  Service: Podiatry;  Laterality: Left;  LMA WITH POPLITEAL TOPAZ  . TONSILLECTOMY      FAMILY HISTORY: Family History  Problem Relation Age of Onset  . Diabetes Mother   .  Stroke Mother   . Heart attack Father        died at age 65  . Heart disease Father   . Macular degeneration Maternal Grandmother   . Dementia Maternal Grandmother   . Prostate cancer Maternal Grandfather   . Heart attack Paternal Grandmother   . Heart disease Paternal Grandmother   . Heart attack Paternal Grandfather   . Heart disease Paternal Grandfather   . Breast cancer Neg Hx     SOCIAL HISTORY:  Social History   Socioeconomic History  . Marital status: Married    Spouse name: Not on file  . Number of children: 1  . Years of education: 50  . Highest education level: High school graduate  Occupational History  . Occupation: Disabled  Social Needs  . Financial resource strain: Not on file  . Food insecurity:    Worry: Not on file    Inability: Not on file  . Transportation needs:    Medical: Not on file    Non-medical: Not on file  Tobacco Use  .  Smoking status: Never Smoker  . Smokeless tobacco: Never Used  Substance and Sexual Activity  . Alcohol use: No  . Drug use: No  . Sexual activity: Yes  Lifestyle  . Physical activity:    Days per week: Not on file    Minutes per session: Not on file  . Stress: Not on file  Relationships  . Social connections:    Talks on phone: Not on file    Gets together: Not on file    Attends religious service: Not on file    Active member of club or organization: Not on file    Attends meetings of clubs or organizations: Not on file    Relationship status: Not on file  . Intimate partner violence:    Fear of current or ex partner: Not on file    Emotionally abused: Not on file    Physically abused: Not on file    Forced sexual activity: Not on file  Other Topics Concern  . Not on file  Social History Narrative   Lives at home with husband.   Right-handed.   No caffeine use.     PHYSICAL EXAM   Vitals:   12/22/17 1030  BP: (!) 132/93  Pulse: 72  Weight: 210 lb 12 oz (95.6 kg)  Height: 5\' 5"  (1.651 m)    Not recorded      Body mass index is 35.07 kg/m.  PHYSICAL EXAMNIATION:  Gen: NAD, conversant, well nourised, obese, well groomed                     Cardiovascular: Regular rate rhythm, no peripheral edema, warm, nontender. Eyes: Conjunctivae clear without exudates or hemorrhage Neck: Supple, no carotid bruits. Pulmonary: Clear to auscultation bilaterally   NEUROLOGICAL EXAM:  MENTAL STATUS: Speech:    Speech is normal; fluent and spontaneous with normal comprehension.  Cognition:     Orientation to time, place and person     Normal recent and remote memory     Normal Attention span and concentration     Normal Language, naming, repeating,spontaneous speech     Fund of knowledge   CRANIAL NERVES: CN II: Visual fields are full to confrontation. Fundoscopic exam is normal with sharp discs and no vascular changes. Pupils are round equal and briskly reactive to  light. CN III, IV, VI: extraocular movement are normal. No ptosis. CN V: Facial sensation  is intact to pinprick in all 3 divisions bilaterally. Corneal responses are intact.  CN VII: Face is symmetric with normal eye closure and smile. CN VIII: Hearing is normal to rubbing fingers CN IX, X: Palate elevates symmetrically. Phonation is normal. CN XI: Head turning and shoulder shrug are intact CN XII: Tongue is midline with normal movements and no atrophy.  MOTOR: There is no pronator drift of out-stretched arms. Muscle bulk and tone are normal. Muscle strength is normal.  Left plantar area paresthesia and pain with deep palpitation, thick plantar soft tissue,  REFLEXES: Reflexes are 2+ and symmetric at the biceps, triceps, knees, and ankles. Plantar responses are flexor.  SENSORY: Intact to light touch, pinprick, positional sensation and vibratory sensation are intact in fingers and toes.  COORDINATION: Rapid alternating movements and fine finger movements are intact. There is no dysmetria on finger-to-nose and heel-knee-shin.    GAIT/STANCE: She needs pushed up to get up from seated position, dragging her left leg, able to stand up on tiptoes, heels,  Romberg signs was negative  DIAGNOSTIC DATA (LABS, IMAGING, TESTING) - I reviewed patient records, labs, notes, testing and imaging myself where available.   ASSESSMENT AND PLAN  Wray Kearnslizabeth Lynne Dinges is a 57 y.o. female   Gait abnormality   Paresthesia    Her gait abnormality a multifactorial, this include left foot pain when bearing weight, weight gain, deconditioning, left hip replacement, history of left lumbar radiculopathy  EMG nerve conduction study to rule out peripheral neuropathy  Refer her to physical therapy  Laboratory evaluations  Also suggested water aerobics, shoe insert.  Continue treatment with her left plantar fasciitis   Levert FeinsteinYijun Zale Marcotte, M.D. Ph.D.  [facility] [address], [address] [place], [place] [address] Ph: [phone]) [phone] Fax: [phone]  UU:VOZDCC:Shah, Durene CalHemang K, MD,  Jerl MinaHedrick, James, MD

## 2017-12-23 ENCOUNTER — Telehealth: Payer: Self-pay

## 2017-12-23 NOTE — Telephone Encounter (Signed)
We received a prior authorization request for this medication. I have completed and submitted the PA on Cover My Meds and should have a determination within 48-72 hours.  Cover My Meds Key: [id number] - PA Case ID: [id number]

## 2017-12-24 ENCOUNTER — Telehealth: Payer: Self-pay

## 2017-12-24 ENCOUNTER — Encounter: Payer: Self-pay | Admitting: *Deleted

## 2017-12-24 LAB — VITAMIN D 25 HYDROXY (VIT D DEFICIENCY, FRACTURES): Vit D, 25-Hydroxy: 18.9 ng/mL — ABNORMAL LOW (ref 30.0–100.0)

## 2017-12-24 LAB — HGB A1C W/O EAG: Hgb A1c MFr Bld: 4.9 % (ref 4.8–5.6)

## 2017-12-24 LAB — RPR: RPR Ser Ql: NONREACTIVE

## 2017-12-24 LAB — COPPER, SERUM: COPPER: 107 ug/dL (ref 72–166)

## 2017-12-24 NOTE — Telephone Encounter (Signed)
[facility] [employer] denied diclofenac gel (her plan will only cover for actinic keratoses).  Dr. Terrace ArabiaYan, pharmacy and patient aware of the denial.  Patient will continue using Aspercreme which has been helpful.

## 2017-12-24 NOTE — Telephone Encounter (Signed)
-----   Message from Levert FeinsteinYijun Yan, MD sent at 12/24/2017  1:45 PM EDT ----- Please call patient: There is evidence of vitamin D deficiency, level of 19, she should take over-the-counter vitamin D3 supplements, 1000 units daily.

## 2017-12-24 NOTE — Telephone Encounter (Signed)
I called pt. I advised her that her vitamin D level was low, and Dr. Terrace ArabiaYan recommended OTC vitamin d3 1000 units daily. Pt says that she is already taking vitamin D [id number] units weekly. She will discuss with her PCP the low vitamin D level. Pt verbalized understanding of results. Pt had no questions at this time but was encouraged to call back if questions arise.

## 2018-01-01 ENCOUNTER — Telehealth: Payer: Self-pay | Admitting: Neurology

## 2018-01-01 NOTE — Telephone Encounter (Signed)
Lab results faxed and confirmed to Dr. Webb SilversmithHedrick's office.

## 2018-01-01 NOTE — Telephone Encounter (Signed)
Pt requesting the lab work(vit D)that has been done faxed to Dr. Burnett Sheng [phone]

## 2018-01-09 ENCOUNTER — Telehealth: Payer: Self-pay | Admitting: *Deleted

## 2018-01-09 DIAGNOSIS — R0602 Shortness of breath: Secondary | ICD-10-CM

## 2018-01-09 DIAGNOSIS — E611 Iron deficiency: Secondary | ICD-10-CM

## 2018-01-09 NOTE — Telephone Encounter (Signed)
Patient called and reports that she has been having trouble breathing and difficulty sleeping because of it. She dose not want to come in today, but would like to have labs drawn Monday PM. She states she is going to use her husbands nebulizer to see if that will help her. She accepts an appointment for Monday at 230 and was advised if she got worse over weekend to go to the ER. I had spoken with Hetty ElyJ Burns, NP and order for CBC, IIBC, Ferr. To be ordered for Monday

## 2018-01-12 ENCOUNTER — Inpatient Hospital Stay: Payer: BC Managed Care – PPO | Attending: Internal Medicine

## 2018-01-12 DIAGNOSIS — D509 Iron deficiency anemia, unspecified: Secondary | ICD-10-CM | POA: Insufficient documentation

## 2018-01-14 ENCOUNTER — Inpatient Hospital Stay: Payer: BC Managed Care – PPO

## 2018-01-14 DIAGNOSIS — D509 Iron deficiency anemia, unspecified: Secondary | ICD-10-CM | POA: Diagnosis not present

## 2018-01-14 DIAGNOSIS — E611 Iron deficiency: Secondary | ICD-10-CM

## 2018-01-14 DIAGNOSIS — R0602 Shortness of breath: Secondary | ICD-10-CM

## 2018-01-14 LAB — FERRITIN: Ferritin: 135 ng/mL (ref 11–307)

## 2018-01-14 LAB — CBC WITH DIFFERENTIAL/PLATELET
BASOS ABS: 0 10*3/uL (ref 0–0.1)
Basophils Relative: 1 %
Eosinophils Absolute: 0.3 10*3/uL (ref 0–0.7)
Eosinophils Relative: 4 %
HEMATOCRIT: 38.2 % (ref 35.0–47.0)
Hemoglobin: 12.8 g/dL (ref 12.0–16.0)
LYMPHS ABS: 1.8 10*3/uL (ref 1.0–3.6)
LYMPHS PCT: 27 %
MCH: 31.4 pg (ref 26.0–34.0)
MCHC: 33.5 g/dL (ref 32.0–36.0)
MCV: 93.6 fL (ref 80.0–100.0)
MONO ABS: 0.5 10*3/uL (ref 0.2–0.9)
MONOS PCT: 7 %
NEUTROS ABS: 4.2 10*3/uL (ref 1.4–6.5)
Neutrophils Relative %: 61 %
Platelets: 208 10*3/uL (ref 150–440)
RBC: 4.08 MIL/uL (ref 3.80–5.20)
RDW: 13.6 % (ref 11.5–14.5)
WBC: 6.8 10*3/uL (ref 3.6–11.0)

## 2018-01-14 LAB — IRON AND TIBC
Iron: 90 ug/dL (ref 28–170)
Saturation Ratios: 35 % — ABNORMAL HIGH (ref 10.4–31.8)
TIBC: 256 ug/dL (ref 250–450)
UIBC: 166 ug/dL

## 2018-01-30 ENCOUNTER — Encounter: Payer: BC Managed Care – PPO | Admitting: Neurology

## 2018-01-30 ENCOUNTER — Ambulatory Visit (INDEPENDENT_AMBULATORY_CARE_PROVIDER_SITE_OTHER): Payer: BC Managed Care – PPO | Admitting: Neurology

## 2018-01-30 DIAGNOSIS — R202 Paresthesia of skin: Secondary | ICD-10-CM

## 2018-01-30 DIAGNOSIS — R269 Unspecified abnormalities of gait and mobility: Secondary | ICD-10-CM

## 2018-01-30 DIAGNOSIS — Z0289 Encounter for other administrative examinations: Secondary | ICD-10-CM

## 2018-01-30 NOTE — Procedures (Signed)
Full Name: Dietrich Ke Gender: Female MRN #: [id number] Date of Birth: 1961-02-29    Visit Date: 01/30/2018 12:23 Age: 57 Years 0 Months Old Examining Physician: Levert Feinstein, MD  Referring Physician: Terrace Arabia, MD History: 57 year old female, with history of left hip replacement, lumbar decompression surgery, procedure for left plantar fasciitis complains of left plantar foot numbness, gait abnormality, difficulty bearing weight on her left side.  Summary of the tests:  Nerve conduction study:  Bilateral sural, superficial peroneal sensory responses were normal.  Bilateral tibial, peroneal to EDB motor responses were normal.  Electromyography: Selective needle examinations of bilateral lower extremity muscles, and bilateral lumbosacral paraspinal muscles were normal.  Conclusion: This is a normal study.  There is no electrodiagnostic evidence of large fiber peripheral neuropathy or bilateral lumbosacral radiculopathy.    ------------------------------- Levert Feinstein, M.D.  [facility] [address] [place], [place] [address] Tel: [phone] Fax: [phone]        [facility]    Nerve / Sites Muscle Latency Ref. Amplitude Ref. Rel Amp Segments Distance Velocity Ref. Area    ms ms mV mV %  cm m/s m/s mVms  R Peroneal - EDB     Ankle EDB 5.2 ?6.5 3.1 ?2.0 100 Ankle - EDB 9   12.6     Fib head EDB 11.7  2.7  87.6 Fib head - Ankle 30 46 ?44 13.4     Pop fossa EDB 14.3  1.4  51.4 Pop fossa - Fib head 12 46 ?44 6.2         Pop fossa - Ankle      L Peroneal - EDB     Ankle EDB 5.2 ?6.5 3.5 ?2.0 100 Ankle - EDB 9   16.6     Fib head EDB 12.0  2.5  70.7 Fib head - Ankle 30 44 ?44 12.0     Pop fossa EDB 14.3  2.3  93.3 Pop fossa - Fib head 10 44 ?44 11.5         Pop fossa - Ankle      R Tibial - AH     Ankle AH 5.4 ?5.8 11.5 ?4.0 100 Ankle - AH 9   27.3     Pop fossa AH 13.1  9.3  81.3 Pop fossa - Ankle 32 42 ?41 25.5  L Tibial - AH     Ankle AH 5.8 ?5.8 6.6  ?4.0 100 Ankle - AH 9   17.4     Pop fossa AH 14.8  3.8  58.1 Pop fossa - Ankle 32 36 ?41 14.1             SNC    Nerve / Sites Rec. Site Peak Lat Ref.  Amp Ref. Segments Distance    ms ms V V  cm  R Sural - Ankle (Calf)     Calf Ankle 4.3 ?4.4 6 ?6 Calf - Ankle 14  L Sural - Ankle (Calf)     Calf Ankle 4.1 ?4.4 8 ?6 Calf - Ankle 14  R Superficial peroneal - Ankle     Lat leg Ankle 4.2 ?4.4 7 ?6 Lat leg - Ankle 14  L Superficial peroneal - Ankle     Lat leg Ankle 4.2 ?4.4 7 ?6 Lat leg - Ankle 14             F  Wave    Nerve F Lat Ref.   ms ms  R Tibial -  AH 52.8 ?56.0  L Tibial - AH 56.6 ?56.0         EMG full       EMG Summary Table    Spontaneous MUAP Recruitment  Muscle IA Fib PSW Fasc Other Amp Dur. Poly Pattern  R. Tibialis anterior Normal None None None _______ Normal Normal Normal Normal  R. Tibialis posterior Normal None None None _______ Normal Normal Normal Normal  R. Peroneus longus Normal None None None _______ Normal Normal Normal Normal  R. Gastrocnemius (Medial head) Normal None None None _______ Normal Normal Normal Normal  R. Vastus lateralis Normal None None None _______ Normal Normal Normal Normal  L. Tibialis anterior Normal None None None _______ Normal Normal Normal Normal  L. Tibialis posterior Normal None None None _______ Normal Normal Normal Normal  L. Peroneus longus Normal None None None _______ Normal Normal Normal Normal  L. Gastrocnemius (Medial head) Normal None None None _______ Normal Normal Normal Normal  L. Vastus lateralis Normal None None None _______ Normal Normal Normal Normal  R. Biceps femoris (long head) Normal None None None _______ Normal Normal Normal Normal  L. Biceps femoris (long head) Normal None None None _______ Normal Normal Normal Normal  R. Lumbar paraspinals (mid) Normal None None None _______ Normal Normal Normal Normal  R. Lumbar paraspinals (low) Normal None None None _______ Normal Normal Normal Normal  L. Lumbar  paraspinals (mid) Normal None None None _______ Normal Normal Normal Normal  L. Lumbar paraspinals (low) Normal None None None _______ Normal Normal Normal Normal

## 2018-01-30 NOTE — Progress Notes (Signed)
I have suggested physical therapy and academic neurology referral, patient declined both.

## 2018-02-10 ENCOUNTER — Telehealth: Payer: Self-pay | Admitting: Neurology

## 2018-02-10 NOTE — Telephone Encounter (Signed)
Spoke to patient - she is aware the test results are normal.  States her foot is still numb but she does not wish to follow up here.  Dr. Terrace Arabia offered a referral to [facility] or [facility] for further evaluation.  Patient declined and stated she would speak to Dr. [name].

## 2018-02-10 NOTE — Telephone Encounter (Signed)
Patient calling to further discuss NCV/EMG and would like to speak to the nurse.

## 2018-02-12 ENCOUNTER — Other Ambulatory Visit: Payer: Self-pay | Admitting: Family Medicine

## 2018-02-12 DIAGNOSIS — R937 Abnormal findings on diagnostic imaging of other parts of musculoskeletal system: Secondary | ICD-10-CM

## 2018-02-12 DIAGNOSIS — I6523 Occlusion and stenosis of bilateral carotid arteries: Secondary | ICD-10-CM

## 2018-02-17 ENCOUNTER — Ambulatory Visit
Admission: RE | Admit: 2018-02-17 | Discharge: 2018-02-17 | Disposition: A | Discharge status: Home / Self Care | Payer: BC Managed Care – PPO | Source: Ambulatory Visit | Attending: Family Medicine | Admitting: Family Medicine

## 2018-02-17 DIAGNOSIS — R937 Abnormal findings on diagnostic imaging of other parts of musculoskeletal system: Secondary | ICD-10-CM | POA: Diagnosis not present

## 2018-02-17 DIAGNOSIS — I6523 Occlusion and stenosis of bilateral carotid arteries: Secondary | ICD-10-CM | POA: Insufficient documentation

## 2018-02-24 ENCOUNTER — Other Ambulatory Visit: Payer: Self-pay | Admitting: Family Medicine

## 2018-02-24 DIAGNOSIS — Z1231 Encounter for screening mammogram for malignant neoplasm of breast: Secondary | ICD-10-CM

## 2018-02-27 ENCOUNTER — Inpatient Hospital Stay: Payer: BC Managed Care – PPO

## 2018-02-27 ENCOUNTER — Encounter: Payer: Self-pay | Admitting: Nurse Practitioner

## 2018-02-27 ENCOUNTER — Inpatient Hospital Stay (HOSPITAL_BASED_OUTPATIENT_CLINIC_OR_DEPARTMENT_OTHER): Payer: BC Managed Care – PPO | Admitting: Nurse Practitioner

## 2018-02-27 ENCOUNTER — Other Ambulatory Visit: Payer: Self-pay

## 2018-02-27 ENCOUNTER — Inpatient Hospital Stay: Payer: BC Managed Care – PPO | Attending: Internal Medicine

## 2018-02-27 VITALS — BP 148/91 | HR 74 | Temp 98.2°F | Resp 18 | Wt 207.0 lb

## 2018-02-27 DIAGNOSIS — E538 Deficiency of other specified B group vitamins: Secondary | ICD-10-CM

## 2018-02-27 DIAGNOSIS — D508 Other iron deficiency anemias: Secondary | ICD-10-CM | POA: Insufficient documentation

## 2018-02-27 DIAGNOSIS — Z9884 Bariatric surgery status: Secondary | ICD-10-CM | POA: Insufficient documentation

## 2018-02-27 DIAGNOSIS — E611 Iron deficiency: Secondary | ICD-10-CM

## 2018-02-27 DIAGNOSIS — I10 Essential (primary) hypertension: Secondary | ICD-10-CM

## 2018-02-27 LAB — CBC WITH DIFFERENTIAL/PLATELET
BASOS ABS: 0 10*3/uL (ref 0–0.1)
BASOS PCT: 0 %
EOS PCT: 4 %
Eosinophils Absolute: 0.2 10*3/uL (ref 0–0.7)
HEMATOCRIT: 35.8 % (ref 35.0–47.0)
Hemoglobin: 12 g/dL (ref 12.0–16.0)
LYMPHS PCT: 27 %
Lymphs Abs: 1.3 10*3/uL (ref 1.0–3.6)
MCH: 31.7 pg (ref 26.0–34.0)
MCHC: 33.5 g/dL (ref 32.0–36.0)
MCV: 94.5 fL (ref 80.0–100.0)
MONO ABS: 0.5 10*3/uL (ref 0.2–0.9)
Monocytes Relative: 10 %
NEUTROS ABS: 2.9 10*3/uL (ref 1.4–6.5)
Neutrophils Relative %: 59 %
Platelets: 177 10*3/uL (ref 150–440)
RBC: 3.79 MIL/uL — ABNORMAL LOW (ref 3.80–5.20)
RDW: 13.9 % (ref 11.5–14.5)
WBC: 4.8 10*3/uL (ref 3.6–11.0)

## 2018-02-27 LAB — IRON AND TIBC
IRON: 64 ug/dL (ref 28–170)
Saturation Ratios: 26 % (ref 10.4–31.8)
TIBC: 250 ug/dL (ref 250–450)
UIBC: 187 ug/dL

## 2018-02-27 LAB — VITAMIN B12: VITAMIN B 12: 407 pg/mL (ref 180–914)

## 2018-02-27 LAB — FERRITIN: FERRITIN: 101 ng/mL (ref 11–307)

## 2018-02-27 NOTE — Progress Notes (Signed)
[name] OFFICE PROGRESS NOTE  Patient Care Team: [name], MD as PCP - General (Family Medicine) Lonell Face, MD as Consulting Physician (Neurology) Earna Coder, MD as Medical Oncologist (Medical Oncology)   SUMMARY OF HEMATOLOGIC HISTORY:  # Iron deficiency/ restless leg syndrome- sec to gastric bypass- on IV iron infusion;   #  B12 def sec to gastric Bypass [1998]-   INTERVAL HISTORY: Very pleasant 57 year old female patient with above hx of agastric by pass in 1998, hx of restless leg syndrome, and fatigue (secondary to functional IDA), returns to clinic today for follow-up and consideration of Feraheme.   She states that she has been diagnosed with motor neuropathy by neurologist, Dr. [name] & Dr. Terrace Arabia [facility]) and some concern if related to B12 deficiency. In the interim she has had multiple falls due to neuropathy. Additionally, she has a history of left hip replacement, lumbar decompression surgery, and plantar fasciitis. She is now walking with cane. She had a nerve conduction study that was normal and electromyography that was normal. Physical therapy and academic neurology referrals were offered but patient declined. Her vitamin D levels were low and she reports increasing dose to twice per week. 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   She last received Feraheme in February and states she has tolerated infusion well. She states she has improvement in her dreams after she receives B12 injections and feels dreaming is important. She is currently taking B12 injections IM at home. She has chronic fatigue but feels it is stable currently. She says appetite is good and denies any bloody, black, or tarry stools. She previously had an intolerance to oral iron. She denies chest pain and or shortness of breath.     REVIEW OF SYSTEMS:  A complete 10 point review of system is done which  is negative except mentioned above/history of present illness.   PAST MEDICAL HISTORY :  Past Medical History:  Diagnosis Date  . Anemia   . Anxiety   . Arthritis    hands, fingers, lower back  . Complication of anesthesia    loss control of bowels after block  . GERD (gastroesophageal reflux disease)   . Headache    hx of migraines in past  . Hypertension   . IDA (iron deficiency anemia) 03/17/2015  . IDA (iron deficiency anemia) 03/17/2015  . Neck pain    s/p fall approx 1 month ago.  Transient. "Random"    PAST SURGICAL HISTORY :   Past Surgical History:  Procedure Laterality Date  . ABDOMINAL HYSTERECTOMY  2008  . APPENDECTOMY    . BACK SURGERY  03/14/14   L4-5 laminectomy.  Dr Dutch Quint, [facility]  . CHOLECYSTECTOMY    . COLONOSCOPY    . COLONOSCOPY WITH PROPOFOL N/A 05/13/2016   Procedure: COLONOSCOPY WITH PROPOFOL;  Surgeon: [name], MD;  Location: [facility] ENDOSCOPY;  Service: Endoscopy;  Laterality: N/A;  . COLONOSCOPY WITH PROPOFOL N/A 05/20/2016   Procedure: COLONOSCOPY WITH PROPOFOL;  Surgeon: [name], MD;  Location: [facility] ENDOSCOPY;  Service: Endoscopy;  Laterality: N/A;  . ESOPHAGOGASTRODUODENOSCOPY (EGD) WITH PROPOFOL N/A 05/13/2016   Procedure: ESOPHAGOGASTRODUODENOSCOPY (EGD) WITH PROPOFOL;  Surgeon: [name], MD;  Location: [facility] ENDOSCOPY;  Service: Endoscopy;  Laterality: N/A;  . GASTRIC BYPASS  2002  . JOINT REPLACEMENT Left 12/2013   hip replacement at [facility]  . PLANTAR FASCIA RELEASE  Left 03/07/2016   Procedure: 1. Partial plantar fascial release with endoscopic procedure  2. Topaz fasciotomy percutaneously;  Surgeon: Recardo EvangelistMatthew Troxler, DPM;  [facility];  Service: Podiatry;  Laterality: Left;  LMA WITH POPLITEAL TOPAZ  . TONSILLECTOMY      FAMILY HISTORY :   Family History  Problem Relation Age of Onset  . Diabetes Mother   . Stroke Mother   . Heart attack Father        died at age 58  . Heart disease Father   . Macular  degeneration Maternal Grandmother   . Dementia Maternal Grandmother   . Prostate cancer Maternal Grandfather   . Heart attack Paternal Grandmother   . Heart disease Paternal Grandmother   . Heart attack Paternal Grandfather   . Heart disease Paternal Grandfather   . Breast cancer Neg Hx     SOCIAL HISTORY:   Social History   Tobacco Use  . Smoking status: Never Smoker  . Smokeless tobacco: Never Used  Substance Use Topics  . Alcohol use: No  . Drug use: No    ALLERGIES:  has No Known Allergies.  MEDICATIONS:  Current Outpatient Medications  Medication Sig Dispense Refill  . acetaminophen (TYLENOL) 650 MG CR tablet Take 650 mg by mouth every 8 (eight) hours as needed for pain.    [name]. aspirin 81 MG tablet Take 81 mg by mouth daily.    [name]. co-enzyme Q-10 30 MG capsule Take 30 mg by mouth daily.     . cyanocobalamin (,VITAMIN B-12,) 1000 MCG/ML injection     . fexofenadine (ALLEGRA) 180 MG tablet Take by mouth.    . gabapentin (NEURONTIN) 600 MG tablet Take 600 mg by mouth 2 (two) times daily.     [name]. losartan (COZAAR) 50 MG tablet Take 50 mg by mouth daily.    . meloxicam (MOBIC) 15 MG tablet take 1 tablet by mouth once daily    . Multiple Vitamin (MULTIVITAMIN) tablet Take 1 tablet by mouth daily.    . Omega-3 Fatty Acids (FISH OIL) 1200 MG CAPS Take by mouth.    [name]. omeprazole (PRILOSEC) 40 MG capsule Take 40 mg by mouth daily.    . sertraline (ZOLOFT) 50 MG tablet 1 QHS    . Syringe/Needle, Disp, (BD SAFETYGLIDE SYRINGE/NEEDLE) 27G X 5/8" 1 ML MISC 1 Syringe by Does not apply route every 30 (thirty) days. 12 each 0  . Vitamin D, Ergocalciferol, (DRISDOL) 50000 UNITS CAPS capsule TAKE 1 CAPSULE (50,000 UNITS TOTAL) BY MOUTH TWICE  A WEEK.  12  . dextromethorphan-guaiFENesin (MUCINEX DM) 30-600 MG 12hr tablet Take by mouth.     No current facility-administered medications for this visit.     PHYSICAL EXAMINATION:  BP (!) 148/91   Pulse 74   Temp 98.2 F (36.8 C) (Tympanic)    Resp 18   Wt 207 lb (93.9 kg)   SpO2 96%   BMI 34.45 kg/m   Filed Weights   02/27/18 1339  Weight: 207 lb (93.9 kg)    GENERAL: Well-nourished well-developed; Alert, no distress and comfortable. Alone.   EYES: no pallor or icterus OROPHARYNX: no thrush or ulceration NECK: supple LUNGS: clear to auscultation bilaterally.  HEART/CVS: regular rate & rhythm; No lower extremity edema ABDOMEN: abdomen soft, non-tender and normal bowel sounds.  Musculoskeletal: no cyanosis of digits and no clubbing; ambulates with cane PSYCH: alert & oriented x 3 with fluent speech NEURO: alert and oriented. Good historian.  SKIN: no rashes or  significant lesions   LABORATORY DATA:  I have reviewed the data as listed    Component Value Date/Time   NA 138 10/24/2017 1325   NA 141 01/19/2014 0457   K 4.1 10/24/2017 1325   K 3.7 01/19/2014 0457   CL 105 10/24/2017 1325   CL 106 01/19/2014 0457   CO2 26 10/24/2017 1325   CO2 29 01/19/2014 0457   GLUCOSE 110 (H) 10/24/2017 1325   GLUCOSE 100 (H) 01/19/2014 0457   BUN 16 10/24/2017 1325   BUN 7 01/19/2014 0457   CREATININE 0.56 10/24/2017 1325   CREATININE 0.96 06/28/2014 0910   CALCIUM 8.6 (L) 10/24/2017 1325   CALCIUM 8.1 (L) 01/19/2014 0457   PROT 6.8 10/24/2017 1325   ALBUMIN 3.7 10/24/2017 1325   AST 20 10/24/2017 1325   ALT 19 10/24/2017 1325   ALKPHOS 97 10/24/2017 1325   BILITOT 0.6 10/24/2017 1325   GFRNONAA >60 10/24/2017 1325   GFRNONAA >60 06/28/2014 0910   GFRNONAA >60 01/19/2014 0457   GFRAA >60 10/24/2017 1325   GFRAA >60 06/28/2014 0910   GFRAA >60 01/19/2014 0457    No results found for: SPEP, UPEP  Lab Results  Component Value Date   WBC 4.8 02/27/2018   NEUTROABS 2.9 02/27/2018   HGB 12.0 02/27/2018   HCT 35.8 02/27/2018   MCV 94.5 02/27/2018   PLT 177 02/27/2018      Chemistry      Component Value Date/Time   NA 138 10/24/2017 1325   NA 141 01/19/2014 0457   K 4.1 10/24/2017 1325   K 3.7 01/19/2014  0457   CL 105 10/24/2017 1325   CL 106 01/19/2014 0457   CO2 26 10/24/2017 1325   CO2 29 01/19/2014 0457   BUN 16 10/24/2017 1325   BUN 7 01/19/2014 0457   CREATININE 0.56 10/24/2017 1325   CREATININE 0.96 06/28/2014 0910      Component Value Date/Time   CALCIUM 8.6 (L) 10/24/2017 1325   CALCIUM 8.1 (L) 01/19/2014 0457   ALKPHOS 97 10/24/2017 1325   AST 20 10/24/2017 1325   ALT 19 10/24/2017 1325   BILITOT 0.6 10/24/2017 1325       ASSESSMENT & PLAN:   Functional Iron Deficiency- Secondary to Gastric Bypass. No anemia. Asymptomatic today but previously symptoms do improve with Iron infusion. Last infusion Feraheme 510mg  x2 (12/27/16 & 10/24/17)  Extreme Fatigue & Restless Leg Syndrome- symptoms are stable and mildly improved. CBC normal. Ferritin today 101, Saturation Ratio- 26.   Will hold off on iron infusion today which patient is agreeable to. Will have her rtc in 4 months for repeat labs then few days later she will rtc for re-evaluation with Dr. Donneta Romberg and consideration of Feraheme. Advised her that if she becomes increasingly symptomatic to call the clinic as she may need infusion sooner.     Consuello Masse, DNP, AGNP-C [facility] [phone] (work cell) [phone] (office) 02/27/18 4:06 PM

## 2018-03-09 ENCOUNTER — Ambulatory Visit
Admission: RE | Admit: 2018-03-09 | Discharge: 2018-03-09 | Disposition: A | Discharge status: Home / Self Care | Payer: BC Managed Care – PPO | Source: Ambulatory Visit | Attending: Family Medicine | Admitting: Family Medicine

## 2018-03-09 DIAGNOSIS — Z1231 Encounter for screening mammogram for malignant neoplasm of breast: Secondary | ICD-10-CM | POA: Diagnosis not present

## 2018-03-18 ENCOUNTER — Other Ambulatory Visit: Payer: Self-pay | Admitting: Neurology

## 2018-03-18 DIAGNOSIS — R29898 Other symptoms and signs involving the musculoskeletal system: Secondary | ICD-10-CM

## 2018-03-30 ENCOUNTER — Telehealth: Payer: Self-pay | Admitting: Internal Medicine

## 2018-03-30 ENCOUNTER — Telehealth: Payer: Self-pay | Admitting: *Deleted

## 2018-03-30 NOTE — Telephone Encounter (Signed)
Left message to call us- Dr.B

## 2018-03-30 NOTE — Telephone Encounter (Signed)
Dr. Donneta RombergBrahmanday left a personally msg on pt's vm to return his call. Dr. B wants to talk directly to the patient.

## 2018-03-30 NOTE — Telephone Encounter (Signed)
Patient called and reports that she is having vitamin deficiencies and neuropathy, Also had shaking and breathing problems for which PCP started her on Buspar, Asking how she does not need Venofer infusion, she fells she needs it and why is she having all these issues with Vitamin deficiencies? Her call back is [phone].  Please advise

## 2018-04-01 ENCOUNTER — Ambulatory Visit
Admission: RE | Admit: 2018-04-01 | Discharge: 2018-04-01 | Disposition: A | Discharge status: Home / Self Care | Payer: BC Managed Care – PPO | Source: Ambulatory Visit | Attending: Neurology | Admitting: Neurology

## 2018-04-01 DIAGNOSIS — M4326 Fusion of spine, lumbar region: Secondary | ICD-10-CM | POA: Diagnosis not present

## 2018-04-01 DIAGNOSIS — M48061 Spinal stenosis, lumbar region without neurogenic claudication: Secondary | ICD-10-CM | POA: Insufficient documentation

## 2018-04-01 DIAGNOSIS — R29898 Other symptoms and signs involving the musculoskeletal system: Secondary | ICD-10-CM | POA: Insufficient documentation

## 2018-04-01 DIAGNOSIS — M545 Low back pain: Secondary | ICD-10-CM | POA: Diagnosis not present

## 2018-04-08 ENCOUNTER — Telehealth: Payer: Self-pay | Admitting: *Deleted

## 2018-04-08 ENCOUNTER — Telehealth: Payer: Self-pay | Admitting: Internal Medicine

## 2018-04-08 NOTE — Telephone Encounter (Signed)
Dr. B did try to leave a vm for the patient. She did not answer. I will give the msg to Dr. Leonard SchwartzB.

## 2018-04-08 NOTE — Telephone Encounter (Signed)
md stated that he would attempt to reach the patient to discuss her concerns.

## 2018-04-08 NOTE — Telephone Encounter (Signed)
FYI- Spoke to patient regarding ongoing fatigue awaiting thyroid work-up with PCP.  Again reviewed the iron labs that are adequate did not require any IV iron.  Patient complains of significant weight loss of 30 pounds in the last 6 months.;  Non-smoker.  Ased the patient to keep us informed of the above work-up from her PCPs office; if the thyroid work-up is normal I would recommend CT of the chest and pelvis for further evaluation for her weight loss.  Question depression because of her symptoms.  Await above work-up/patient call.

## 2018-04-08 NOTE — Telephone Encounter (Signed)
Patient called triage and left a message saying that she is battling with depression, she is barely coping, and sometimes feels like "she is losing her mind". She went to Dr. Burnett ShengHedrick prescribed Buspar for her, and told her that she had a "chemical imbalannce", starting dose was 7.5 mg and she is now up to 10 mg, with no relief. SHe has noticed that she has developed some "shakes" that she didn't have prior to starting the Buspar. She stated that she talked to Dr. Donneta RombergBrahmanday last week and he was going to call her back, but she didn't hear from him, or maybe she missed the call. She is concerned that maybe her vitamin deficiencies are causing some of her symptoms. Please advise.       dhs

## 2018-04-11 ENCOUNTER — Encounter: Payer: Self-pay | Admitting: Gynecology

## 2018-04-11 ENCOUNTER — Ambulatory Visit
Admission: EM | Admit: 2018-04-11 | Discharge: 2018-04-11 | Disposition: A | Discharge status: Home / Self Care | Payer: BC Managed Care – PPO | Attending: Family Medicine | Admitting: Family Medicine

## 2018-04-11 DIAGNOSIS — F419 Anxiety disorder, unspecified: Secondary | ICD-10-CM

## 2018-04-11 DIAGNOSIS — F329 Major depressive disorder, single episode, unspecified: Secondary | ICD-10-CM

## 2018-04-11 DIAGNOSIS — F32A Depression, unspecified: Secondary | ICD-10-CM

## 2018-04-11 MED ORDER — SERTRALINE HCL 100 MG PO TABS: 100.0000 mg | ORAL_TABLET | Freq: Every day | ORAL | 1 refills | Status: AC

## 2018-04-11 NOTE — ED Provider Notes (Signed)
[facility]    CSN: [id number] Arrival date & time: 04/11/18  [id number]  History   Chief Complaint Chief Complaint  Patient presents with  . Panic Attack  . Depression   HPI  57 year old female presents with the above complaints.  Patient is experiencing ongoing depression and anxiety.  Worsening over the past 2 months.  Patient states that she has had ongoing weight loss for the past year.  This seemed to start after having multiple back surgeries and going on disability.  She states that she has been feeling poorly and feels as if she is going to have a breakdown.  She states that she is shaky, feels depressed, very tearful and upset.  She has told her husband that she feels alone.  She has seen her primary care physician and had a recent increase in Zoloft and addition of BuSpar.  She states that she is yet to have an improvement.  She is also had recent decreasing gabapentin.  Recent laboratory studies unremarkable.  Patient is concerned about her worsening symptoms and just wants to feel better.  Husband states that she is socially isolated and does not go out much.  Additionally, they do not see much of each other as he works third shift.  All these factors seem to be playing a role.  Past Medical History:  Diagnosis Date  . Anemia   . Anxiety   . Arthritis    hands, fingers, lower back  . Complication of anesthesia    loss control of bowels after block  . GERD (gastroesophageal reflux disease)   . Headache    hx of migraines in past  . Hypertension   . IDA (iron deficiency anemia) 03/17/2015  . IDA (iron deficiency anemia) 03/17/2015  . Neck pain    s/p fall approx 1 month ago.  Transient. "Random"    Patient Active Problem List   Diagnosis Date Noted  . 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 Gait abnormality 12/22/2017  . Paresthesia 12/22/2017  . Dietary iron deficiency 12/27/2016  . B12 deficiency 09/26/2016  . IDA (iron deficiency anemia) 03/17/2015  .  Degenerative spondylolisthesis 03/14/2014    Past Surgical History:  Procedure Laterality Date  . ABDOMINAL HYSTERECTOMY  2008  . APPENDECTOMY    . BACK SURGERY  03/14/14   L4-5 laminectomy.  Dr Dutch QuintPoole, [facility]  . CHOLECYSTECTOMY    . COLONOSCOPY    . COLONOSCOPY WITH PROPOFOL N/A 05/13/2016   Procedure: COLONOSCOPY WITH PROPOFOL;  Surgeon: Christena DeemMartin U Skulskie, MD;  Location[facility] END[facility]COPY;  Service: Endoscopy;  Laterality: N/A;  . COLONOSCOPY WITH PROPOFOL N/A 05/20/2016   Procedure: COLONOSCOPY WITH PROPOFOL;  Surgeon: Christena DeemMartin U Skulskie, MD;  [facility];  Service: Endoscopy;  Laterality: N/A;  . ESOPHAGOGASTRODUODENOSCOPY (EGD) WITH PROPOFOL N/A 05/13/2016   Procedure: ESOPHAGOGASTRODUODENOSCOPY (EGD) WITH PROPOFOL;  Surgeon: Christena DeemMartin U Skulskie, MD;  Location[facility] ENDOSCOPY;  Service: Endoscopy;  Laterality: N/A;  . GASTRIC BYPASS  2002  . JOINT REPLACEMENT Left 12/2013   hip [facility] at [facility]  . PLANTAR FASCIA RELEASE Left 03/07/2016   Procedure: 1. Partial plantar fascial release with endoscopic procedure  2. Topaz fasciotomy percutaneously;  Surgeon: Recardo EvangelistMatthew Troxler, DPM;  [facility];  Service: Podiatry;  Laterality: Left;  LMA WITH POPLITEAL TOPAZ  . TONSILLECTOMY      OB History   None      Home Medications    Prior to Admission medications   Medication Sig Start Date End Date  Taking? Authorizing Provider  acetaminophen (TYLENOL) 650 MG CR tablet Take 650 mg by mouth every 8 (eight) hours as needed for pain.   Yes [provider]  aspirin 81 MG tablet Take 81 mg by mouth daily.   Yes [provider]  busPIRone (BUSPAR) 7.5 MG tablet TAKE 1 TABLET (7.5 MG TOTAL) BY MOUTH 2 (TWO) TIMES DAILY 03/25/18  Yes [provider]  co-enzyme Q-10 30 MG capsule Take 30 mg by mouth daily.    Yes [provider]  cyanocobalamin (,VITAMIN B-12,) 1000 MCG/ML injection  09/30/16  Yes [provider]    dextromethorphan-guaiFENesin (MUCINEX DM) 30-600 MG 12hr tablet Take by mouth.   Yes [provider]  fexofenadine (ALLEGRA) 180 MG tablet Take by mouth.   Yes [provider]  gabapentin (NEURONTIN) 600 MG tablet Take 600 mg by mouth 2 (two) times daily.    Yes [provider]  losartan (COZAAR) 50 MG tablet Take 50 mg by mouth daily. 11/20/16  Yes [provider]  meloxicam (MOBIC) 15 MG tablet take 1 tablet by mouth once daily 07/31/15  Yes [provider]  Multiple Vitamin (MULTIVITAMIN) tablet Take 1 tablet by mouth daily.   Yes [provider]  Omega-3 Fatty Acids (FISH OIL) 1200 MG CAPS Take by mouth.   Yes [provider]  pantoprazole (PROTONIX) 40 MG tablet TAKE 1 TABLET (40 MG TOTAL) BY MOUTH ONCE DAILY. 30 MINUTES PRIOR TO A MEAL. 02/27/18  Yes [provider]  sertraline (ZOLOFT) 100 MG tablet Take 1 tablet (100 mg total) by mouth daily. 04/11/18   Tommie Sams, DO  Syringe/Needle, Disp, (BD SAFETYGLIDE SYRINGE/NEEDLE) 27G X 5/8" 1 ML MISC 1 Syringe by Does not apply route every 30 (thirty) days. 12/27/16   Earna Coder, MD    Family History Family History  Problem Relation Age of Onset  . Diabetes Mother   . Stroke Mother   . Heart attack Father        died at age 49  . Heart disease Father   . Macular degeneration Maternal Grandmother   . Dementia Maternal Grandmother   . Prostate cancer Maternal Grandfather   . Heart attack Paternal Grandmother   . Heart disease Paternal Grandmother   . Heart attack Paternal Grandfather   . Heart disease Paternal Grandfather   . Breast cancer Neg Hx     Social History Social History   Tobacco Use  . Smoking status: Never Smoker  . Smokeless tobacco: Never Used  Substance Use Topics  . Alcohol use: No  . Drug use: No     Allergies   Patient has no known allergies.   Review of Systems Review of Systems  Constitutional: Positive for unexpected  weight change.  Psychiatric/Behavioral: The patient is nervous/anxious.        Depression.   Physical Exam Triage Vital Signs ED Triage Vitals  Enc Vitals Group     BP 04/11/18 0931 (!) 130/93     Pulse Rate 04/11/18 0931 83     Resp 04/11/18 0931 18     Temp 04/11/18 0931 98.3 F (36.8 C)     Temp Source 04/11/18 0931 Oral     SpO2 04/11/18 0931 100 %     Weight 04/11/18 0931 194 lb (88 kg)     Height 04/11/18 0931 5\' 5"  (1.651 m)     Head Circumference --      Peak Flow --  Pain Score 04/11/18 1016 0     Pain Loc --      Pain Edu? --      Excl. in GC? --    Updated Vital Signs BP (!) 130/93 (BP Location: Left Arm)   Pulse 83   Temp 98.3 F (36.8 C) (Oral)   Resp 18   Ht 5\' 5"  (1.651 m)   Wt 194 lb (88 kg)   SpO2 100%   BMI 32.28 kg/m   Visual Acuity Right Eye Distance:   Left Eye Distance:   Bilateral Distance:    Right Eye Near:   Left Eye Near:    Bilateral Near:     Physical Exam  Constitutional: She is oriented to person, place, and time. She appears well-developed. No distress.  HENT:  Head: Normocephalic and atraumatic.  Eyes: Conjunctivae are normal. No scleral icterus.  Cardiovascular: Normal rate and regular rhythm.  Pulmonary/Chest: Effort normal. No respiratory distress.  Neurological: She is alert and oriented to person, place, and time.  Psychiatric: Her behavior is normal. Thought content normal.  Crying. Anxious.  Nursing note and vitals reviewed.  UC Treatments / Results  Labs (all labs ordered are listed, but only abnormal results are displayed) Labs Reviewed - No data to display  EKG None  Radiology No results found.  Procedures Procedures (including critical care time)  Medications Ordered in UC Medications - No data to display  Initial Impression / Assessment and Plan / UC Course  I have reviewed the triage vital signs and the nursing notes.  Pertinent labs & imaging results that were available during my care of  the patient were reviewed by me and considered in my medical decision making (see chart for details).    57 year old female presents with anxiety and depression.  Increasing Zoloft.  Advised to see therapist.  Patient has no suicidal ideations at this time.  Advised that if she has thoughts of suicide, she should seek help immediately.  Patient in agreement.  Final Clinical Impressions(s) / UC Diagnoses   Final diagnoses:  Anxiety and depression     Discharge Instructions     Call about your appt.  Start the increased dose of Zoloft (100 mg daily).  Go have some fun!  Take care  Dr. Adriana Simas    ED Prescriptions    Medication Sig Dispense Auth. Provider   sertraline (ZOLOFT) 100 MG tablet Take 1 tablet (100 mg total) by mouth daily. 90 tablet Tommie Sams, DO     Controlled Substance Prescriptions [place] Controlled Substance Registry consulted? Not Applicable   Tommie Sams, DO 04/11/18 1029

## 2018-04-11 NOTE — ED Triage Notes (Signed)
Per patient c/o depression x 2 months. Per patient not getting any better with her medication.

## 2018-04-11 NOTE — Discharge Instructions (Signed)
Call about your appt.  Start the increased dose of Zoloft (100 mg daily).  Go have some fun!  Take care  Dr. Adriana Simasook

## 2018-04-23 ENCOUNTER — Encounter: Payer: Self-pay | Admitting: Emergency Medicine

## 2018-04-23 ENCOUNTER — Ambulatory Visit
Admission: EM | Admit: 2018-04-23 | Discharge: 2018-04-23 | Disposition: A | Discharge status: Home / Self Care | Payer: BC Managed Care – PPO | Attending: Emergency Medicine | Admitting: Emergency Medicine

## 2018-04-23 ENCOUNTER — Other Ambulatory Visit: Payer: Self-pay

## 2018-04-23 DIAGNOSIS — J069 Acute upper respiratory infection, unspecified: Secondary | ICD-10-CM | POA: Diagnosis not present

## 2018-04-23 DIAGNOSIS — H65193 Other acute nonsuppurative otitis media, bilateral: Secondary | ICD-10-CM

## 2018-04-23 DIAGNOSIS — H6503 Acute serous otitis media, bilateral: Secondary | ICD-10-CM

## 2018-04-23 MED ORDER — FLUTICASONE PROPIONATE 50 MCG/ACT NA SUSP: 1.0000 | Freq: Every day | NASAL | 2 refills | Status: DC

## 2018-04-23 MED ORDER — AZITHROMYCIN 250 MG PO TABS: ORAL_TABLET | ORAL | 0 refills | Status: DC

## 2018-04-23 NOTE — ED Triage Notes (Signed)
Patient c/o bilateral ear pain, cough, congestion and sore throat that started last weekend.

## 2018-04-23 NOTE — Discharge Instructions (Addendum)
-  azithromycin: Take 2 tablets by mouth the first day followed by one tablet daily for next 4 days. -Flonase: 1 spray directed towards the ears daily. -Continue Mucinex -Continue daily Allegra -Over-the-counter ibuprofen or Tylenol as needed for pain -Follow-up with primary care provider as needed.

## 2018-04-23 NOTE — ED Provider Notes (Signed)
[facility]    CSN: [id number] Arrival date & time: 04/23/18  1036     History   Chief Complaint Chief Complaint  Patient presents with  . Cough    HPI [name] is a 57 y.o. female.   Patient is a 57 year old female who presents with complaint of congestion, sore throat, runny nose (worse at night), ear pain (right worse than left) with muffled hearing on the right and green mucus production with coughing.  Patient states she has been taking Mucinex as well as taking a daily Allegra.  Patient reports her husband was seen here last week for similar symptoms.  Patient denies any chest pain, shortness of breath, abdominal pain, nausea,  or vomiting.     Past Medical History:  Diagnosis Date  . Anemia   . Anxiety   . Arthritis    hands, fingers, lower back  . Complication of anesthesia    loss control of bowels after block  . GERD (gastroesophageal reflux disease)   . Headache    hx of migraines in past  . Hypertension   . IDA (iron deficiency anemia) 03/17/2015  . IDA (iron deficiency anemia) 03/17/2015  . Neck pain    s/p fall approx 1 month ago.  Transient. "Random"    Patient Active Problem List   Diagnosis Date Noted  . 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 Gait abnormality 12/22/2017  . Paresthesia 12/22/2017  . Dietary iron deficiency 12/27/2016  . B12 deficiency 09/26/2016  . IDA (iron deficiency anemia) 03/17/2015  . Degenerative spondylolisthesis 03/14/2014    Past Surgical History:  Procedure Laterality Date  . ABDOMINAL HYSTERECTOMY  2008  . APPENDECTOMY    . BACK SURGERY  03/14/14   L4-5 laminectomy.  Dr Dutch Quint, [facility]  . CHOLECYSTECTOMY    . COLONOSCOPY    . COLONOSCOPY WITH PROPOFOL N/A 05/13/2016   Procedure: COLONOSCOPY WITH PROPOFOL;  Surgeon: [name], MD;  Location: [facility] ENDOSCOPY;  Service: Endoscopy;  Laterality: N/A;  . COLONOSCOPY WITH PROPOFOL N/A 05/20/2016   Procedure: COLONOSCOPY WITH PROPOFOL;   Surgeon: [name], MD;  Location: [facility] ENDOSCOPY;  Service: Endoscopy;  Laterality: N/A;  . ESOPHAGOGASTRODUODENOSCOPY (EGD) WITH PROPOFOL N/A 05/13/2016   Procedure: ESOPHAGOGASTRODUODENOSCOPY (EGD) WITH PROPOFOL;  Surgeon: [name], MD;  Location: [facility] ENDOSCOPY;  Service: Endoscopy;  Laterality: N/A;  . GASTRIC BYPASS  2002  . JOINT REPLACEMENT Left 12/2013   hip replacement at [facility]  . PLANTAR FASCIA RELEASE Left 03/07/2016   Procedure: 1. Partial plantar fascial release with endoscopic procedure  2. Topaz fasciotomy percutaneously;  Surgeon: Recardo Evangelist, DPM;  [facility];  Service: Podiatry;  Laterality: Left;  LMA WITH POPLITEAL TOPAZ  . TONSILLECTOMY      OB History   None      Home Medications    Prior to Admission medications   Medication Sig Start Date End Date Taking? Authorizing Provider  aspirin 81 MG tablet Take 81 mg by mouth daily.   Yes [provider]  busPIRone (BUSPAR) 7.5 MG tablet TAKE 1 TABLET (7.5 MG TOTAL) BY MOUTH 2 (TWO) TIMES DAILY 03/25/18  Yes [provider]  co-enzyme Q-10 30 MG capsule Take 30 mg by mouth daily.    Yes [provider]  cyanocobalamin (,VITAMIN B-12,) 1000 MCG/ML injection  09/30/16  Yes [provider]  fexofenadine (ALLEGRA) 180 MG tablet Take by mouth.   Yes [provider]  gabapentin (NEURONTIN) 600 MG tablet  Take 600 mg by mouth 2 (two) times daily.    Yes [provider]  losartan (COZAAR) 50 MG tablet Take 50 mg by mouth daily. 11/20/16  Yes [provider]  meloxicam (MOBIC) 15 MG tablet take 1 tablet by mouth once daily 07/31/15  Yes [provider]  Multiple Vitamin (MULTIVITAMIN) tablet Take 1 tablet by mouth daily.   Yes [provider]  Omega-3 Fatty Acids (FISH OIL) 1200 MG CAPS Take by mouth.   Yes [provider]  pantoprazole (PROTONIX) 40 MG tablet TAKE 1 TABLET (40 MG TOTAL) BY MOUTH ONCE DAILY.  30 MINUTES PRIOR TO A MEAL. 02/27/18  Yes [provider]  sertraline (ZOLOFT) 100 MG tablet Take 1 tablet (100 mg total) by mouth daily. 04/11/18  Yes Cook, Jayce G, DO  acetaminophen (TYLENOL) 650 MG CR tablet Take 650 mg by mouth every 8 (eight) hours as needed for pain.    [provider]  azithromycin (ZITHROMAX Z-PAK) 250 MG tablet Take 2 tablets by mouth the first day followed by one tablet daily for next 4 days. 04/23/18   Candis Schatz, PA-C  dextromethorphan-guaiFENesin (MUCINEX DM) 30-600 MG 12hr tablet Take by mouth.    [provider]  fluticasone (FLONASE) 50 MCG/ACT nasal spray Place 1 spray into both nostrils daily. 04/23/18   Candis Schatz, PA-C  Syringe/Needle, Disp, (BD SAFETYGLIDE SYRINGE/NEEDLE) 27G X 5/8" 1 ML MISC 1 Syringe by Does not apply route every 30 (thirty) days. 12/27/16   Earna Coder, MD    Family History Family History  Problem Relation Age of Onset  . Diabetes Mother   . Stroke Mother   . Heart attack Father        died at age 65  . Heart disease Father   . Macular degeneration Maternal Grandmother   . Dementia Maternal Grandmother   . Prostate cancer Maternal Grandfather   . Heart attack Paternal Grandmother   . Heart disease Paternal Grandmother   . Heart attack Paternal Grandfather   . Heart disease Paternal Grandfather   . Breast cancer Neg Hx     Social History Social History   Tobacco Use  . Smoking status: Never Smoker  . Smokeless tobacco: Never Used  Substance Use Topics  . Alcohol use: No  . Drug use: No     Allergies   Prednisone   Review of Systems Review of Systems as noted above in HPI.  Other systems reviewed and found to be negative   Physical Exam Triage Vital Signs ED Triage Vitals  Enc Vitals Group     BP 04/23/18 1047 114/83     Pulse Rate 04/23/18 1047 78     Resp 04/23/18 1047 16     Temp 04/23/18 1047 98.4 F (36.9 C)     Temp Source 04/23/18 1047 Oral     SpO2  04/23/18 1047 99 %     Weight 04/23/18 1044 193 lb (87.5 kg)     Height 04/23/18 1044 5\' 4"  (1.626 m)     Head Circumference --      Peak Flow --      Pain Score 04/23/18 1044 5     Pain Loc --      Pain Edu? --      Excl. in GC? --    No data found.  Updated Vital Signs BP 114/83 (BP Location: Right Arm)   Pulse 78   Temp 98.4 F (36.9 C) (Oral)  Resp 16   Ht 5\' 4"  (1.626 m)   Wt 193 lb (87.5 kg)   SpO2 99%   BMI 33.13 kg/m   Physical Exam  Constitutional: She is oriented to person, place, and time. She appears well-developed and well-nourished. No distress.  HENT:  Head: Atraumatic.  Right Ear: There is tenderness. Tympanic membrane is bulging. A middle ear effusion is present.  Left Ear: A middle ear effusion is present.  Nose: Nose normal. Right sinus exhibits no maxillary sinus tenderness and no frontal sinus tenderness. Left sinus exhibits no maxillary sinus tenderness and no frontal sinus tenderness.  Mouth/Throat: Uvula is midline. Posterior oropharyngeal erythema present. Tonsils are 0 on the left.  Eyes: Pupils are equal, round, and reactive to light. EOM are normal.  Neck: Normal range of motion.  Cardiovascular: Normal rate, regular rhythm and normal heart sounds.  Pulmonary/Chest: Effort normal. No stridor. She has no wheezes. She has no rales.  Abdominal: Soft. Bowel sounds are normal.  Musculoskeletal: Normal range of motion.  Lymphadenopathy:    She has cervical adenopathy.  Neurological: She is alert and oriented to person, place, and time. No cranial nerve deficit. Coordination normal.  Skin: Skin is warm and dry.     UC Treatments / Results  Labs (all labs ordered are listed, but only abnormal results are displayed) Labs Reviewed - No data to display  EKG None  Radiology No results found.  Procedures Procedures (including critical care time)  Medications Ordered in UC Medications - No data to display  Initial Impression / Assessment  and Plan / UC Course  I have reviewed the triage vital signs and the nursing notes.  Pertinent labs & imaging results that were available during my care of the patient were reviewed by me and considered in my medical decision making (see chart for details).     Patient with upper restorer symptoms including bilateral ear pain, right worse than left with muffled hearing on the right.  Some cervical lymph node adenopathy.  Reported green mucus production.  Going treat her with a course of azithromycin.  Also add Flonase to have her take daily to help with ear drainage.  Have her continue her Mucinex and her Allegra.  Final Clinical Impressions(s) / UC Diagnoses   Final diagnoses:  Acute effusion of both middle ears  Upper respiratory tract infection, unspecified type     Discharge Instructions     -azithromycin: Take 2 tablets by mouth the first day followed by one tablet daily for next 4 days. -Flonase: 1 spray directed towards the ears daily. -Continue Mucinex -Continue daily Allegra -Over-the-counter ibuprofen or Tylenol as needed for pain -Follow-up with primary care provider as needed.    ED Prescriptions    Medication Sig Dispense Auth. Provider   azithromycin (ZITHROMAX Z-PAK) 250 MG tablet Take 2 tablets by mouth the first day followed by one tablet daily for next 4 days. 6 tablet Candis SchatzHarris, Rohin Krejci D, PA-C   fluticasone [facility](FLONASE) 50 MCG/ACT nasal spray Place 1 spray into both nostrils daily. 16 g Candis SchatzHarris, Yamen Castrogiovanni D, PA-C     Controlled Substance Prescriptions [place] Controlled Substance Registry consulted? Not Applicable   Candis SchatzHarris, Nichelle Renwick D, PA-C 04/23/18 1116

## 2018-06-29 ENCOUNTER — Inpatient Hospital Stay: Payer: BC Managed Care – PPO | Attending: Internal Medicine

## 2018-06-29 DIAGNOSIS — E538 Deficiency of other specified B group vitamins: Secondary | ICD-10-CM | POA: Diagnosis not present

## 2018-06-29 DIAGNOSIS — Z9884 Bariatric surgery status: Secondary | ICD-10-CM | POA: Insufficient documentation

## 2018-06-29 DIAGNOSIS — I10 Essential (primary) hypertension: Secondary | ICD-10-CM | POA: Insufficient documentation

## 2018-06-29 DIAGNOSIS — G629 Polyneuropathy, unspecified: Secondary | ICD-10-CM | POA: Diagnosis not present

## 2018-06-29 DIAGNOSIS — E611 Iron deficiency: Secondary | ICD-10-CM | POA: Insufficient documentation

## 2018-06-29 DIAGNOSIS — R413 Other amnesia: Secondary | ICD-10-CM | POA: Insufficient documentation

## 2018-06-29 DIAGNOSIS — D508 Other iron deficiency anemias: Secondary | ICD-10-CM

## 2018-06-29 LAB — COMPREHENSIVE METABOLIC PANEL
ALK PHOS: 93 U/L (ref 38–126)
ALT: 17 U/L (ref 0–44)
ANION GAP: 6 (ref 5–15)
AST: 18 U/L (ref 15–41)
Albumin: 3.7 g/dL (ref 3.5–5.0)
BUN: 17 mg/dL (ref 6–20)
CALCIUM: 8.8 mg/dL — AB (ref 8.9–10.3)
CO2: 27 mmol/L (ref 22–32)
Chloride: 105 mmol/L (ref 98–111)
Creatinine, Ser: 0.65 mg/dL (ref 0.44–1.00)
GFR calc Af Amer: >60 mL/min (ref >60)
GFR calc non Af Amer: >60 mL/min (ref >60)
Glucose, Bld: 93 mg/dL (ref 70–99)
POTASSIUM: 5 mmol/L (ref 3.5–5.1)
SODIUM: 138 mmol/L (ref 135–145)
Total Bilirubin: 0.6 mg/dL (ref 0.3–1.2)
Total Protein: 6.6 g/dL (ref 6.5–8.1)

## 2018-06-29 LAB — CBC WITH DIFFERENTIAL/PLATELET
BASOS PCT: 0 %
Basophils Absolute: 0 10*3/uL (ref 0–0.1)
Eosinophils Absolute: 0.3 10*3/uL (ref 0–0.7)
Eosinophils Relative: 5 %
HEMATOCRIT: 38.3 % (ref 35.0–47.0)
HEMOGLOBIN: 12.8 g/dL (ref 12.0–16.0)
LYMPHS PCT: 29 %
Lymphs Abs: 1.7 10*3/uL (ref 1.0–3.6)
MCH: 31.4 pg (ref 26.0–34.0)
MCHC: 33.4 g/dL (ref 32.0–36.0)
MCV: 93.9 fL (ref 80.0–100.0)
MONOS PCT: 9 %
Monocytes Absolute: 0.5 10*3/uL (ref 0.2–0.9)
NEUTROS ABS: 3.5 10*3/uL (ref 1.4–6.5)
NEUTROS PCT: 57 %
Platelets: 209 10*3/uL (ref 150–440)
RBC: 4.08 MIL/uL (ref 3.80–5.20)
RDW: 13.9 % (ref 11.5–14.5)
WBC: 6.1 10*3/uL (ref 3.6–11.0)

## 2018-06-29 LAB — IRON AND TIBC
Iron: 78 ug/dL (ref 28–170)
SATURATION RATIOS: 31 % (ref 10.4–31.8)
TIBC: 249 ug/dL — AB (ref 250–450)
UIBC: 171 ug/dL

## 2018-06-29 LAB — FERRITIN: Ferritin: 106 ng/mL (ref 11–307)

## 2018-07-01 ENCOUNTER — Other Ambulatory Visit: Payer: Self-pay

## 2018-07-01 ENCOUNTER — Encounter: Payer: Self-pay | Admitting: Internal Medicine

## 2018-07-01 ENCOUNTER — Inpatient Hospital Stay: Payer: BC Managed Care – PPO

## 2018-07-01 ENCOUNTER — Inpatient Hospital Stay (HOSPITAL_BASED_OUTPATIENT_CLINIC_OR_DEPARTMENT_OTHER): Payer: BC Managed Care – PPO | Admitting: Internal Medicine

## 2018-07-01 VITALS — BP 123/92 | HR 80 | Temp 97.8°F | Resp 20 | Ht 64.0 in | Wt 182.0 lb

## 2018-07-01 DIAGNOSIS — G629 Polyneuropathy, unspecified: Secondary | ICD-10-CM

## 2018-07-01 DIAGNOSIS — Z9884 Bariatric surgery status: Secondary | ICD-10-CM

## 2018-07-01 DIAGNOSIS — E538 Deficiency of other specified B group vitamins: Secondary | ICD-10-CM

## 2018-07-01 DIAGNOSIS — R413 Other amnesia: Secondary | ICD-10-CM | POA: Diagnosis not present

## 2018-07-01 DIAGNOSIS — E611 Iron deficiency: Secondary | ICD-10-CM | POA: Diagnosis not present

## 2018-07-01 DIAGNOSIS — I10 Essential (primary) hypertension: Secondary | ICD-10-CM

## 2018-07-01 NOTE — Progress Notes (Signed)
[facility] OFFICE PROGRESS NOTE  Patient Care Team: [name], MD as PCP - General (Family Medicine) Lonell Face, MD as Consulting Physician (Neurology) Earna Coder, MD as Medical Oncologist (Medical Oncology)   SUMMARY OF ONCOLOGIC HISTORY:  # Iron deficiency/ restless leg syndrome- sec to gastric bypass- on IV iron infusion;   #  B12 def sec to gastric Bypass [1998]; Motor neuropathy [dx-neurology]  INTERVAL HISTORY:  A very pleasant 57 year old female patient with above history of gastric bypass in 1998- history of restless legs syndrome and fatigue secondary to functional iron deficiency visit for follow-up.   Patient states that she has been diagnosed with 'motor neuropathy' as per neurology.  Patient questions if B12 deficiency is causing the neuropathy.  Patient also complains of memory loss poor short-term long-term.  Patient complains of moderate fatigue. Patient's appetite is good. Denies any blood in stools black stools.. No chest pain or shortness of breath. Poor tolerance to iron.   Review of Systems  Constitutional: Positive for malaise/fatigue. Negative for chills, diaphoresis, fever and weight loss.  HENT: Negative for nosebleeds and sore throat.   Eyes: Negative for double vision.  Respiratory: Negative for cough, hemoptysis, sputum production, shortness of breath and wheezing.   Cardiovascular: Negative for chest pain, palpitations, orthopnea and leg swelling.  Gastrointestinal: Negative for abdominal pain, blood in stool, constipation, diarrhea, heartburn, melena, nausea and vomiting.  Genitourinary: Negative for dysuria, frequency and urgency.  Musculoskeletal: Positive for back pain and joint pain.  Skin: Negative.  Negative for itching and rash.  Neurological: Negative for dizziness, tingling, focal weakness, weakness and headaches.  Endo/Heme/Allergies: Does not bruise/bleed easily.  Psychiatric/Behavioral: Positive for memory  loss. Negative for depression. The patient is not nervous/anxious and does not have insomnia.      PAST MEDICAL HISTORY :  Past Medical History:  Diagnosis Date  . Anemia   . Anxiety   . Arthritis    hands, fingers, lower back  . Complication of anesthesia    loss control of bowels after block  . GERD (gastroesophageal reflux disease)   . Headache    hx of migraines in past  . Hypertension   . IDA (iron deficiency anemia) 03/17/2015  . IDA (iron deficiency anemia) 03/17/2015  . Neck pain    s/p fall approx 1 month ago.  Transient. "Random"    PAST SURGICAL HISTORY :   Past Surgical History:  Procedure Laterality Date  . ABDOMINAL HYSTERECTOMY  2008  . APPENDECTOMY    . BACK SURGERY  03/14/14   L4-5 laminectomy.  Dr Dutch Quint, [facility]  . CHOLECYSTECTOMY    . COLONOSCOPY    . COLONOSCOPY WITH PROPOFOL N/A 05/13/2016   Procedure: COLONOSCOPY WITH PROPOFOL;  Surgeon: [name], MD;  Location: [facility] ENDOSCOPY;  Service: Endoscopy;  Laterality: N/A;  . COLONOSCOPY WITH PROPOFOL N/A 05/20/2016   Procedure: COLONOSCOPY WITH PROPOFOL;  Surgeon: [name], MD;  Location: [facility] ENDOSCOPY;  Service: Endoscopy;  Laterality: N/A;  . ESOPHAGOGASTRODUODENOSCOPY (EGD) WITH PROPOFOL N/A 05/13/2016   Procedure: ESOPHAGOGASTRODUODENOSCOPY (EGD) WITH PROPOFOL;  Surgeon: [name], MD;  Location: [facility] ENDOSCOPY;  Service: Endoscopy;  Laterality: N/A;  . GASTRIC BYPASS  2002  . JOINT REPLACEMENT Left 12/2013   hip replacement at [facility]  . PLANTAR FASCIA RELEASE Left 03/07/2016   Procedure: 1. Partial plantar fascial release with endoscopic procedure  2. Topaz fasciotomy percutaneously;  Surgeon: Recardo Evangelist, DPM;  Location: [facility];  Service: Podiatry;  Laterality: Left;  LMA WITH POPLITEAL TOPAZ  . TONSILLECTOMY      FAMILY HISTORY :   Family History  Problem Relation Age of Onset  . Diabetes Mother   . Stroke Mother   . Heart attack Father        died at age 19   . Heart disease Father   . Macular degeneration Maternal Grandmother   . Dementia Maternal Grandmother   . Prostate cancer Maternal Grandfather   . Heart attack Paternal Grandmother   . Heart disease Paternal Grandmother   . Heart attack Paternal Grandfather   . Heart disease Paternal Grandfather   . Breast cancer Neg Hx     SOCIAL HISTORY:   Social History   Tobacco Use  . Smoking status: Never Smoker  . Smokeless tobacco: Never Used  Substance Use Topics  . Alcohol use: No  . Drug use: No    ALLERGIES:  is allergic to prednisone.  MEDICATIONS:  Current Outpatient Medications  Medication Sig Dispense Refill  . aspirin 81 MG tablet Take 81 mg by mouth daily.    . busPIRone (BUSPAR) 7.5 MG tablet TAKE 1 TABLET (7.5 MG TOTAL) BY MOUTH 2 (TWO) TIMES DAILY  1  . co-enzyme Q-10 30 MG capsule Take 30 mg by mouth daily.     . cyanocobalamin (,VITAMIN B-12,) 1000 MCG/ML injection     . fexofenadine (ALLEGRA) 180 MG tablet Take by mouth.    . fluticasone (FLONASE) 50 MCG/ACT nasal spray Place 1 spray into both nostrils daily. 16 g 2  . gabapentin (NEURONTIN) 600 MG tablet Take 600 mg by mouth daily. Takes a half of tablet in the am and half tablet at bedtime    . Ibuprofen-diphenhydrAMINE Cit (ADVIL PM PO) Take 1 capsule by mouth daily.    [name] losartan (COZAAR) 50 MG tablet Take 50 mg by mouth daily.    . meloxicam (MOBIC) 15 MG tablet take 1 tablet by mouth once daily    . Multiple Vitamin (MULTIVITAMIN) tablet Take 1 tablet by mouth daily.    . Omega-3 Fatty Acids (FISH OIL) 1200 MG CAPS Take by mouth.    . pantoprazole (PROTONIX) 40 MG tablet TAKE 1 TABLET (40 MG TOTAL) BY MOUTH ONCE DAILY. 30 MINUTES PRIOR TO A MEAL.  2  . sertraline (ZOLOFT) 100 MG tablet Take 1 tablet (100 mg total) by mouth daily. 90 tablet 1  . Vitamin D, Ergocalciferol, (DRISDOL) 50000 units CAPS capsule Take 3 capsules by mouth once a week.  3   No current facility-administered medications for this visit.      PHYSICAL EXAMINATION:  BP (!) 123/92 (BP Location: Left Arm, Patient Position: Sitting)   Pulse 80   Temp 97.8 F (36.6 C) (Tympanic)   Resp 20   Ht 5\' 4"  (1.626 m)   Wt 182 lb (82.6 kg)   BMI 31.24 kg/m   Filed Weights   07/01/18 1353  Weight: 182 lb (82.6 kg)    Physical Exam  Constitutional: She is oriented to person, place, and time and well-developed, well-nourished, and in no distress.  HENT:  Head: Normocephalic and atraumatic.  Mouth/Throat: Oropharynx is clear and moist. No oropharyngeal exudate.  Eyes: Pupils are equal, round, and reactive to light.  Neck: Normal range of motion. Neck supple.  Cardiovascular: Normal rate and regular rhythm.  Pulmonary/Chest: No respiratory distress. She has no wheezes.  Abdominal: Soft. Bowel sounds are normal. She exhibits no distension and no mass. There is no  tenderness. There is no rebound and no guarding.  Musculoskeletal: Normal range of motion. She exhibits no edema or tenderness.  Neurological: She is alert and oriented to person, place, and time.  Skin: Skin is warm.  Psychiatric: Affect normal.    LABORATORY DATA:  I have reviewed the data as listed    Component Value Date/Time   NA 138 06/29/2018 1403   NA 141 01/19/2014 0457   K 5.0 06/29/2018 1403   K 3.7 01/19/2014 0457   CL 105 06/29/2018 1403   CL 106 01/19/2014 0457   CO2 27 06/29/2018 1403   CO2 29 01/19/2014 0457   GLUCOSE 93 06/29/2018 1403   GLUCOSE 100 (H) 01/19/2014 0457   BUN 17 06/29/2018 1403   BUN 7 01/19/2014 0457   CREATININE 0.65 06/29/2018 1403   CREATININE 0.96 06/28/2014 0910   CALCIUM 8.8 (L) 06/29/2018 1403   CALCIUM 8.1 (L) 01/19/2014 0457   PROT 6.6 06/29/2018 1403   ALBUMIN 3.7 06/29/2018 1403   AST 18 06/29/2018 1403   ALT 17 06/29/2018 1403   ALKPHOS 93 06/29/2018 1403   BILITOT 0.6 06/29/2018 1403   GFRNONAA >60 06/29/2018 1403   GFRNONAA >60 06/28/2014 0910   GFRNONAA >60 01/19/2014 0457   GFRAA >60 06/29/2018 1403    GFRAA >60 06/28/2014 0910   GFRAA >60 01/19/2014 0457    No results found for: SPEP, UPEP  Lab Results  Component Value Date   WBC 6.1 06/29/2018   NEUTROABS 3.5 06/29/2018   HGB 12.8 06/29/2018   HCT 38.3 06/29/2018   MCV 93.9 06/29/2018   PLT 209 06/29/2018      Chemistry      Component Value Date/Time   NA 138 06/29/2018 1403   NA 141 01/19/2014 0457   K 5.0 06/29/2018 1403   K 3.7 01/19/2014 0457   CL 105 06/29/2018 1403   CL 106 01/19/2014 0457   CO2 27 06/29/2018 1403   CO2 29 01/19/2014 0457   BUN 17 06/29/2018 1403   BUN 7 01/19/2014 0457   CREATININE 0.65 06/29/2018 1403   CREATININE 0.96 06/28/2014 0910      Component Value Date/Time   CALCIUM 8.8 (L) 06/29/2018 1403   CALCIUM 8.1 (L) 01/19/2014 0457   ALKPHOS 93 06/29/2018 1403   AST 18 06/29/2018 1403   ALT 17 06/29/2018 1403   BILITOT 0.6 06/29/2018 1403       ASSESSMENT & PLAN:   Dietary iron deficiency # 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 S/p IV ferrhem [feb 2019]. Currently improved-iron saturation 31% hemoglobin 12.8.  No IV iron today.  # memory loss- both short/long term- defer to neurology.   # B12 sec to gastric bypass. On B12 IM at home [syringes given]  # Motor Neuropathy-stable.? Etiology; unlikely B12 deficiency Followed by Dr.Shah.  # DISPOSITION:  # NO treatment today.  # follow up in 6 months;MD; few days prior- labs/IV ferrahem posisble-Dr.B     Earna Coder, MD 07/03/2018 11:30 PM

## 2018-07-01 NOTE — Assessment & Plan Note (Addendum)
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 S/p IV ferrhem [feb 2019]. Currently improved-iron saturation 31% hemoglobin 12.8.  No IV iron today.  # memory loss- both short/long term- defer to neurology.   # B12 sec to gastric bypass. On B12 IM at home [syringes given]  # Motor Neuropathy-stable.? Etiology; unlikely B12 deficiency Followed by Dr.Shah.  # DISPOSITION:  # NO treatment today.  # follow up in 6 months;MD; few days prior- labs/IV ferrahem posisble-Dr.B

## 2018-08-05 ENCOUNTER — Other Ambulatory Visit: Payer: Self-pay | Admitting: Gastroenterology

## 2018-08-05 DIAGNOSIS — R1310 Dysphagia, unspecified: Secondary | ICD-10-CM

## 2018-08-05 DIAGNOSIS — K219 Gastro-esophageal reflux disease without esophagitis: Secondary | ICD-10-CM

## 2018-08-05 DIAGNOSIS — R112 Nausea with vomiting, unspecified: Secondary | ICD-10-CM

## 2018-08-10 ENCOUNTER — Ambulatory Visit
Admission: RE | Admit: 2018-08-10 | Discharge: 2018-08-10 | Disposition: A | Discharge status: Home / Self Care | Payer: BC Managed Care – PPO | Source: Ambulatory Visit | Attending: Gastroenterology | Admitting: Gastroenterology

## 2018-08-10 DIAGNOSIS — R1310 Dysphagia, unspecified: Secondary | ICD-10-CM | POA: Diagnosis not present

## 2018-08-10 DIAGNOSIS — K449 Diaphragmatic hernia without obstruction or gangrene: Secondary | ICD-10-CM | POA: Insufficient documentation

## 2018-08-10 DIAGNOSIS — K219 Gastro-esophageal reflux disease without esophagitis: Secondary | ICD-10-CM | POA: Diagnosis present

## 2018-08-10 DIAGNOSIS — R112 Nausea with vomiting, unspecified: Secondary | ICD-10-CM | POA: Insufficient documentation

## 2018-08-13 ENCOUNTER — Other Ambulatory Visit: Payer: Self-pay | Admitting: Internal Medicine

## 2018-08-13 DIAGNOSIS — E538 Deficiency of other specified B group vitamins: Secondary | ICD-10-CM

## 2018-08-13 NOTE — Telephone Encounter (Signed)
deficiency   Ref Range & Units 73mo ago  Vitamin B-12 180 - 914 pg/mL 407   Comment: (NOTE)  This assay is not validated for testing neonatal or  myeloproliferative syndrome specimens for Vitamin B12 levels.  Performed at [facility], [address] [place], [place]  [id number]   Resulting Agency  [facility]      Specimen Collected: 02/27/18 13:18 Last Resulted: 02/27/18 21:03

## 2018-10-09 ENCOUNTER — Encounter: Payer: Self-pay | Admitting: *Deleted

## 2018-10-10 ENCOUNTER — Encounter: Payer: Self-pay | Admitting: Anesthesiology

## 2018-10-12 ENCOUNTER — Ambulatory Visit: Payer: BC Managed Care – PPO | Admitting: Anesthesiology

## 2018-10-12 ENCOUNTER — Ambulatory Visit
Admission: RE | Admit: 2018-10-12 | Discharge: 2018-10-12 | Disposition: A | Discharge status: Home / Self Care | Payer: BC Managed Care – PPO | Attending: Gastroenterology | Admitting: Gastroenterology

## 2018-10-12 ENCOUNTER — Encounter
Admission: RE | Disposition: A | Discharge status: Home / Self Care | Payer: Self-pay | Source: Home / Self Care | Attending: Gastroenterology

## 2018-10-12 ENCOUNTER — Other Ambulatory Visit: Payer: Self-pay

## 2018-10-12 ENCOUNTER — Encounter: Payer: Self-pay | Admitting: *Deleted

## 2018-10-12 DIAGNOSIS — Z9884 Bariatric surgery status: Secondary | ICD-10-CM | POA: Diagnosis not present

## 2018-10-12 DIAGNOSIS — K228 Other specified diseases of esophagus: Secondary | ICD-10-CM | POA: Insufficient documentation

## 2018-10-12 DIAGNOSIS — F329 Major depressive disorder, single episode, unspecified: Secondary | ICD-10-CM | POA: Insufficient documentation

## 2018-10-12 DIAGNOSIS — F419 Anxiety disorder, unspecified: Secondary | ICD-10-CM | POA: Insufficient documentation

## 2018-10-12 DIAGNOSIS — K319 Disease of stomach and duodenum, unspecified: Secondary | ICD-10-CM | POA: Insufficient documentation

## 2018-10-12 DIAGNOSIS — K449 Diaphragmatic hernia without obstruction or gangrene: Secondary | ICD-10-CM | POA: Insufficient documentation

## 2018-10-12 DIAGNOSIS — R1310 Dysphagia, unspecified: Secondary | ICD-10-CM | POA: Insufficient documentation

## 2018-10-12 DIAGNOSIS — I10 Essential (primary) hypertension: Secondary | ICD-10-CM | POA: Diagnosis not present

## 2018-10-12 DIAGNOSIS — R112 Nausea with vomiting, unspecified: Secondary | ICD-10-CM | POA: Diagnosis not present

## 2018-10-12 DIAGNOSIS — Z6831 Body mass index (BMI) 31.0-31.9, adult: Secondary | ICD-10-CM | POA: Diagnosis not present

## 2018-10-12 DIAGNOSIS — Z79899 Other long term (current) drug therapy: Secondary | ICD-10-CM | POA: Diagnosis not present

## 2018-10-12 DIAGNOSIS — K2960 Other gastritis without bleeding: Secondary | ICD-10-CM | POA: Diagnosis not present

## 2018-10-12 DIAGNOSIS — Z7982 Long term (current) use of aspirin: Secondary | ICD-10-CM | POA: Diagnosis not present

## 2018-10-12 DIAGNOSIS — K210 Gastro-esophageal reflux disease with esophagitis: Secondary | ICD-10-CM | POA: Insufficient documentation

## 2018-10-12 DIAGNOSIS — K224 Dyskinesia of esophagus: Secondary | ICD-10-CM | POA: Diagnosis not present

## 2018-10-12 DIAGNOSIS — E669 Obesity, unspecified: Secondary | ICD-10-CM | POA: Insufficient documentation

## 2018-10-12 DIAGNOSIS — Z791 Long term (current) use of non-steroidal anti-inflammatories (NSAID): Secondary | ICD-10-CM | POA: Insufficient documentation

## 2018-10-12 HISTORY — PX: ESOPHAGOGASTRODUODENOSCOPY (EGD) WITH PROPOFOL: SHX5813

## 2018-10-12 HISTORY — DX: Major depressive disorder, single episode, unspecified: F32.9

## 2018-10-12 HISTORY — DX: Depression, unspecified: F32.A

## 2018-10-12 HISTORY — DX: Gastritis, unspecified, without bleeding: K29.70

## 2018-10-12 SURGERY — ESOPHAGOGASTRODUODENOSCOPY (EGD) WITH PROPOFOL
Anesthesia: General

## 2018-10-12 MED ORDER — SODIUM CHLORIDE 0.9 % IV SOLN
INTRAVENOUS | Status: DC
  Administered 2018-10-12: 12:00:00 via INTRAVENOUS

## 2018-10-12 MED ORDER — SODIUM CHLORIDE 0.9 % IV SOLN
INTRAVENOUS | Status: DC
  Administered 2018-10-12: 11:00:00 via INTRAVENOUS

## 2018-10-12 MED ORDER — LIDOCAINE HCL (CARDIAC) PF 100 MG/5ML IV SOSY
PREFILLED_SYRINGE | INTRAVENOUS | Status: DC | PRN
  Administered 2018-10-12: 50 mg via INTRAVENOUS

## 2018-10-12 MED ORDER — PROPOFOL 500 MG/50ML IV EMUL
INTRAVENOUS | Status: DC | PRN
  Administered 2018-10-12: 100 ug/kg/min via INTRAVENOUS

## 2018-10-12 NOTE — Anesthesia Post-op Follow-up Note (Signed)
Anesthesia QCDR form completed.        

## 2018-10-12 NOTE — Anesthesia Preprocedure Evaluation (Signed)
Anesthesia Evaluation  Patient identified by MRN, date of birth, ID band Patient awake    Reviewed: Allergy & Precautions, NPO status , Patient's Chart, lab work & pertinent test results, reviewed documented beta blocker date and time   Airway Mallampati: III  TM Distance: >3 FB     Dental  (+) Chipped   Pulmonary           Cardiovascular hypertension, Pt. on medications      Neuro/Psych  Headaches, PSYCHIATRIC DISORDERS Anxiety Depression    GI/Hepatic GERD  ,  Endo/Other    Renal/GU      Musculoskeletal  (+) Arthritis ,   Abdominal   Peds  Hematology  (+) anemia ,   Anesthesia Other Findings Obese. K 5.0. gastric bypass.  Reproductive/Obstetrics                             Anesthesia Physical Anesthesia Plan  ASA: III  Anesthesia Plan: General   Post-op Pain Management:    Induction: Intravenous  PONV Risk Score and Plan:   Airway Management Planned:   Additional Equipment:   Intra-op Plan:   Post-operative Plan:   Informed Consent: I have reviewed the patients History and Physical, chart, labs and discussed the procedure including the risks, benefits and alternatives for the proposed anesthesia with the patient or authorized representative who has indicated his/her understanding and acceptance.       Plan Discussed with: CRNA  Anesthesia Plan Comments:         Anesthesia Quick Evaluation

## 2018-10-12 NOTE — Transfer of Care (Signed)
Immediate Anesthesia Transfer of Care Note  Patient: [name]  Procedure(s) Performed: ESOPHAGOGASTRODUODENOSCOPY (EGD) WITH PROPOFOL (N/A )  Patient Location: PACU  Anesthesia Type:MAC  Level of Consciousness: awake  Airway & Oxygen Therapy: Patient Spontanous Breathing  Post-op Assessment: Report given to RN  Post vital signs: Reviewed  Last Vitals:  Vitals Value Taken Time  BP    Temp    Pulse    Resp    SpO2      Last Pain:  Vitals:   10/12/18 1023  TempSrc: Tympanic  PainSc: 0-No pain         Complications: No apparent anesthesia complications

## 2018-10-12 NOTE — Op Note (Signed)
[facility] Patient Name: [name] Procedure Date: 10/12/2018 11:07 AM MRN: [id number] Account #: [id number] Date of Birth: 04-18-1961 Admit Type: Outpatient Age: 58 Room: [facility] ENDO ROOM 3 Gender: Female Note Status: Finalized Procedure:            Upper GI endoscopy Indications:          Dysphagia, Nausea with vomiting Providers:            [name], MD Referring MD:         Rhona Leavens. Burnett Sheng, MD (Referring MD) Medicines:            Monitored Anesthesia Care Procedure:            Pre-Anesthesia Assessment:                       - ASA Grade Assessment: II - A patient with mild                        systemic disease.                       After obtaining informed consent, the endoscope was                        passed under direct vision. Throughout the procedure,                        the patient's blood pressure, pulse, and oxygen                        saturations were monitored continuously. The Endoscope                        was introduced through the mouth, and advanced to the                        afferent and efferent jejunal loops. The patient                        tolerated the procedure well. Findings:      The Z-line was variable. Biopsies were taken with a cold forceps for       histology.      LA Grade B (one or more mucosal breaks greater than 5 mm, not extending       between the tops of two mucosal folds) esophagitis with no bleeding was       found. Biopsies were taken with a cold forceps for histology.      Abnormal motility was noted in the distal esophagus. There is spasticity       of the esophageal body. Secondary peristaltic waves are noted.      Diffuse moderate inflammation characterized by congestion (edema),       erythema and friability was found in the entire examined stomach.       Biopsies were taken with a cold forceps for histology.      Evidence of a Roux-en-Y gastrojejunostomy was  found. Ther gastric pouch       is about 10 cm. The gastrojejunal anastomosis was characterized by       healthy appearing mucosa and an intact staple line. This was traversed.       The  pouch-to-jejunum limb was characterized by healthy appearing mucosa.       Both the afferent and afferent limbs were normal in appearance.      A 2 cm hiatal hernia was present. Close inspection of the hiatal hernia       pouch shows no evidence of ulcer or other lesion than the noted       esophagitis Impression:           - Z-line variable. Biopsied.                       - LA Grade B erosive esophagitis. Biopsied.                       - Abnormal esophageal motility.                       - Bile gastritis. Biopsied.                       - Roux-en-Y gastrojejunostomy with gastrojejunal                        anastomosis characterized by an intact staple line and                        healthy appearing mucosa. Recommendation:       - Discharge patient to home.                       - Use Aciphex (rabeprazole) 20 mg PO BID for 3 weeks.                       - Use Aciphex (rabeprazole) 20 mg PO daily.                       - Use sucralfate suspension 1 gram PO QID for 1 month.                       - Return to GI clinic in 1 month. Procedure Code(s):    --- Professional ---                       [phone] [id number], Esophagogastroduodenoscopy, flexible, transoral;                        with biopsy, single or multiple Diagnosis Code(s):    --- Professional ---                       K22.8, Other specified diseases of esophagus                       K20.8, Other esophagitis                       K22.4, Dyskinesia of esophagus                       K29.60, Other gastritis without bleeding                       Z98.0, Intestinal bypass and anastomosis status  R13.10, Dysphagia, unspecified                       R11.2, Nausea with vomiting, unspecified CPT copyright 2018 American Medical  Association. All rights reserved. The codes documented in this report are preliminary and upon coder review may  be revised to meet current compliance requirements. Christena DeemMartin U Keziah Avis, MD 10/12/2018 12:02:40 PM This report has been signed electronically. Number of Addenda: 0 Note Initiated On: 10/12/2018 11:07 AM      [facility]

## 2018-10-12 NOTE — Anesthesia Postprocedure Evaluation (Signed)
Anesthesia Post Note  Patient: [name]  Procedure(s) Performed: ESOPHAGOGASTRODUODENOSCOPY (EGD) WITH PROPOFOL (N/A )  Patient location during evaluation: Endoscopy Anesthesia Type: General Level of consciousness: awake and alert Pain management: pain level controlled Vital Signs Assessment: post-procedure vital signs reviewed and stable Respiratory status: spontaneous breathing, nonlabored ventilation, respiratory function stable and patient connected to nasal cannula oxygen Cardiovascular status: blood pressure returned to baseline and stable Postop Assessment: no apparent nausea or vomiting Anesthetic complications: no     Last Vitals:  Vitals:   10/12/18 1207 10/12/18 1217  BP: 123/81 (!) 148/97  Pulse:    Resp:    Temp:    SpO2:      Last Pain:  Vitals:   10/12/18 1227  TempSrc:   PainSc: 0-No pain                 [name] S

## 2018-10-12 NOTE — H&P (Signed)
Outpatient short stay form Pre-procedure 10/12/2018 11:24 AM Amber Deem MD  Primary Physician: [name], MD  Reason for visit: EGD  History of present illness: Patient is a 58 year old female presenting today for an EGD in regards to complaints of reflux nausea vomiting and dysphagia.  Have a barium swallow on 08/10/2018.  This showed a small nonreducible hiatal hernia with what appeared to be a small ulcer in this.  Office distended well otherwise there was no evidence of a fixed stricture.  The barium tablet passed promptly from the mouth to the stomach.  Patient's epigastric discomfort is in the upper epigastrium.  Does not force regurgitate foods.  Complained of a lot of reflux issue though.  Is also of note patient takes meloxicam daily and has been taking 3 Advil PMs immediately before going to bed.  He has been prescribed some Protonix however has not been taking this in association with proper spacing for meals.  She has had a gastric bypass in the past and has a relatively small gastric pouch remnant.  She denies use of the aspirin except with the 81 mg aspirin daily.    Current Facility-Administered Medications:  .  0.9 %  sodium chloride infusion, , Intravenous, Continuous, Amber Deem, MD .  0.9 %  sodium chloride infusion, , Intravenous, Continuous, Amber Deem, MD, Last Rate: 20 mL/hr at 10/12/18 1038  Medications Prior to Admission  Medication Sig Dispense Refill Last Dose  . aspirin 81 MG tablet Take 81 mg by mouth daily.   Past Week at Unknown time  . co-enzyme Q-10 30 MG capsule Take 30 mg by mouth daily.    Past Week at Unknown time  . cyanocobalamin (,VITAMIN B-12,) 1000 MCG/ML injection    Past Week at Unknown time  . fexofenadine (ALLEGRA) 180 MG tablet Take by mouth.   Past Week at Unknown time  . fluticasone (FLONASE) 50 MCG/ACT nasal spray Place 1 spray into both nostrils daily. 16 g 2 Past Month at Unknown time  . gabapentin (NEURONTIN)  600 MG tablet Take 600 mg by mouth daily. Takes a half of tablet in the am and half tablet at bedtime   Past Week at Unknown time  . Ibuprofen-diphenhydrAMINE Cit (ADVIL PM PO) Take 1 capsule by mouth daily.   Past Week at Unknown time  . losartan (COZAAR) 50 MG tablet Take 50 mg by mouth daily.   Past Week at Unknown time  . meloxicam (MOBIC) 15 MG tablet take 1 tablet by mouth once daily   Past Week at Unknown time  . Multiple Vitamin (MULTIVITAMIN) tablet Take 1 tablet by mouth daily.   Past Week at Unknown time  . Omega-3 Fatty Acids (FISH OIL) 1200 MG CAPS Take by mouth.   Past Week at Unknown time  . pantoprazole (PROTONIX) 40 MG tablet TAKE 1 TABLET (40 MG TOTAL) BY MOUTH ONCE DAILY. 30 MINUTES PRIOR TO A MEAL.  2 Past Week at Unknown time  . sertraline (ZOLOFT) 100 MG tablet Take 1 tablet (100 mg total) by mouth daily. 90 tablet 1 Past Week at Unknown time  . sucralfate (CARAFATE) 1 g tablet Take 1 g by mouth 4 (four) times daily.     . Vitamin D, Ergocalciferol, (DRISDOL) 50000 units CAPS capsule Take 3 capsules by mouth once a week.  3 Past Week at Unknown time  . busPIRone (BUSPAR) 7.5 MG tablet TAKE 1 TABLET (7.5 MG TOTAL) BY MOUTH 2 (TWO) TIMES DAILY  1  Taking     Allergies  Allergen Reactions  . Prednisone Palpitations     Past Medical History:  Diagnosis Date  . Anemia   . Anxiety   . Arthritis    hands, fingers, lower back  . Complication of anesthesia    loss control of bowels after block  . Depression   . Gastritis   . GERD (gastroesophageal reflux disease)   . Headache    hx of migraines in past  . Hypertension   . IDA (iron deficiency anemia) 03/17/2015  . IDA (iron deficiency anemia) 03/17/2015  . Neck pain    s/p fall approx 1 month ago.  Transient. "Random"    Review of systems:      Physical Exam    Heart and lungs: Good rate and rhythm without rub or gallop, lungs are bilaterally clear.    HEENT: Normocephalic atraumatic eyes are anicteric     Other:    Pertinant exam for procedure: Soft nontender nondistended bowel sounds positive normoactive.    Planned proceedures: EGD and indicated procedures. I have discussed the risks benefits and complications of procedures to include not limited to bleeding, infection, perforation and the risk of sedation and the patient wishes to proceed.    Amber Deem, MD Gastroenterology 10/12/2018  11:24 AM

## 2018-10-13 ENCOUNTER — Encounter: Payer: Self-pay | Admitting: Gastroenterology

## 2018-10-13 LAB — SURGICAL PATHOLOGY

## 2018-10-14 ENCOUNTER — Other Ambulatory Visit: Payer: Self-pay | Admitting: Gastroenterology

## 2018-10-14 DIAGNOSIS — R1312 Dysphagia, oropharyngeal phase: Secondary | ICD-10-CM

## 2018-11-02 ENCOUNTER — Other Ambulatory Visit: Payer: BC Managed Care – PPO

## 2018-11-04 ENCOUNTER — Ambulatory Visit: Payer: BC Managed Care – PPO

## 2018-11-04 ENCOUNTER — Ambulatory Visit: Payer: BC Managed Care – PPO | Admitting: Internal Medicine

## 2018-11-10 ENCOUNTER — Ambulatory Visit: Admission: RE | Admit: 2018-11-10 | Payer: BC Managed Care – PPO | Source: Ambulatory Visit

## 2018-12-25 ENCOUNTER — Other Ambulatory Visit: Payer: Self-pay | Admitting: *Deleted

## 2018-12-25 MED ORDER — CYANOCOBALAMIN 1000 MCG/ML IJ SOLN: 1000.0000 ug | INTRAMUSCULAR | 0 refills | Status: DC

## 2019-01-01 ENCOUNTER — Other Ambulatory Visit: Payer: Self-pay | Admitting: *Deleted

## 2019-01-01 DIAGNOSIS — D508 Other iron deficiency anemias: Secondary | ICD-10-CM

## 2019-01-04 ENCOUNTER — Inpatient Hospital Stay: Payer: BC Managed Care – PPO | Attending: Hematology and Oncology

## 2019-01-04 ENCOUNTER — Other Ambulatory Visit: Payer: Self-pay

## 2019-01-04 ENCOUNTER — Inpatient Hospital Stay: Payer: BC Managed Care – PPO

## 2019-01-04 DIAGNOSIS — E611 Iron deficiency: Secondary | ICD-10-CM | POA: Diagnosis not present

## 2019-01-04 DIAGNOSIS — D508 Other iron deficiency anemias: Secondary | ICD-10-CM

## 2019-01-04 LAB — CBC WITH DIFFERENTIAL/PLATELET
Abs Immature Granulocytes: 0.02 10*3/uL (ref 0.00–0.07)
Basophils Absolute: 0 10*3/uL (ref 0.0–0.1)
Basophils Relative: 0 %
Eosinophils Absolute: 0.3 10*3/uL (ref 0.0–0.5)
Eosinophils Relative: 3 %
HCT: 41.3 % (ref 36.0–46.0)
Hemoglobin: 13.1 g/dL (ref 12.0–15.0)
Immature Granulocytes: 0 %
Lymphocytes Relative: 24 %
Lymphs Abs: 1.9 10*3/uL (ref 0.7–4.0)
MCH: 30.7 pg (ref 26.0–34.0)
MCHC: 31.7 g/dL (ref 30.0–36.0)
MCV: 96.7 fL (ref 80.0–100.0)
Monocytes Absolute: 0.7 10*3/uL (ref 0.1–1.0)
Monocytes Relative: 8 %
Neutro Abs: 5.1 10*3/uL (ref 1.7–7.7)
Neutrophils Relative %: 65 %
Platelets: 214 10*3/uL (ref 150–400)
RBC: 4.27 MIL/uL (ref 3.87–5.11)
RDW: 13.2 % (ref 11.5–15.5)
WBC: 7.9 10*3/uL (ref 4.0–10.5)
nRBC: 0 % (ref 0.0–0.2)

## 2019-01-04 LAB — IRON AND TIBC
Iron: 38 ug/dL (ref 28–170)
Saturation Ratios: 15 % (ref 10.4–31.8)
TIBC: 253 ug/dL (ref 250–450)
UIBC: 215 ug/dL

## 2019-01-04 LAB — FERRITIN: Ferritin: 79 ng/mL (ref 11–307)

## 2019-01-06 ENCOUNTER — Ambulatory Visit: Payer: BC Managed Care – PPO

## 2019-01-06 ENCOUNTER — Ambulatory Visit: Payer: BC Managed Care – PPO | Admitting: Internal Medicine

## 2019-01-08 ENCOUNTER — Telehealth: Payer: Self-pay | Admitting: *Deleted

## 2019-01-08 NOTE — Telephone Encounter (Signed)
-----   Message from Earna Coder, MD sent at 01/06/2019  9:57 AM EDT ----- Reon Hunley/Brooke-please inform patient that her labs CBC and iron studies are normal; no IV iron.  Recommend follow-up in June as planned/CBC possible Feraheme. Thx

## 2019-01-08 NOTE — Telephone Encounter (Signed)
Contacted patient. She was provided with test results. I explained that no Iv iron is needed at this time. Pt informed to keep her apts in June as scheduled.

## 2019-03-03 ENCOUNTER — Telehealth: Payer: Self-pay | Admitting: *Deleted

## 2019-03-03 NOTE — Telephone Encounter (Addendum)
I called patient and got her voice mail. I left message that we are taking all precautions to protect our patient's from [place] and to please call back to let me know if she will keep appointment.

## 2019-03-03 NOTE — Telephone Encounter (Signed)
[name]. Her infusion is on the same day as the md visit. She will still have to have labs the day prior.

## 2019-03-03 NOTE — Telephone Encounter (Signed)
Patient called and states she will come for her infusion, but is leary to come to see doctor. Please advise if this can be changed to Dox visit

## 2019-03-08 ENCOUNTER — Inpatient Hospital Stay: Payer: BC Managed Care – PPO

## 2019-03-10 ENCOUNTER — Inpatient Hospital Stay: Payer: BC Managed Care – PPO

## 2019-03-10 ENCOUNTER — Inpatient Hospital Stay: Payer: BC Managed Care – PPO | Admitting: Internal Medicine

## 2019-03-17 ENCOUNTER — Other Ambulatory Visit: Payer: Self-pay | Admitting: Internal Medicine

## 2019-03-26 ENCOUNTER — Ambulatory Visit
Admission: EM | Admit: 2019-03-26 | Discharge: 2019-03-26 | Disposition: A | Discharge status: Home / Self Care | Payer: BC Managed Care – PPO | Attending: Family Medicine | Admitting: Family Medicine

## 2019-03-26 ENCOUNTER — Other Ambulatory Visit: Payer: Self-pay

## 2019-03-26 ENCOUNTER — Encounter: Payer: Self-pay | Admitting: Emergency Medicine

## 2019-03-26 DIAGNOSIS — W57XXXA Bitten or stung by nonvenomous insect and other nonvenomous arthropods, initial encounter: Secondary | ICD-10-CM

## 2019-03-26 DIAGNOSIS — R5383 Other fatigue: Secondary | ICD-10-CM

## 2019-03-26 MED ORDER — DOXYCYCLINE HYCLATE 100 MG PO CAPS: 100.0000 mg | ORAL_CAPSULE | Freq: Two times a day (BID) | ORAL | 0 refills | Status: DC

## 2019-03-26 NOTE — ED Triage Notes (Signed)
Patient c/o fatigue off and on for a month. Patient states that she did have a rash 2 weeks ago. Patient reports tick bite over a month ago.

## 2019-03-26 NOTE — Discharge Instructions (Signed)
Take medication as prescribed. Rest. Drink plenty of fluids. Avoid a lot of sunlight with antibiotic. Monitor as discussed.   Follow up with your primary care physician this week.   Return to Urgent care for new or worsening concerns.

## 2019-03-26 NOTE — ED Provider Notes (Signed)
[facility] ____________________________________________  Time seen: Approximately 1:10 PM  I have reviewed the triage vital signs and the nursing notes.   HISTORY  Chief Complaint Insect Bite (tick)   HPI [name] is a 58 y.o. female past medical history of anemia, depression, anxiety, hypertension presenting for evaluation of fatigue.  Patient reports fatigue has been generalized without acute worsening over the last 1 month.  Patient reports that she has been working to wait out other causes.  Patient reports that she has continued to follow with her psychiatrist and her depression has been well controlled.  States she is continue to follow with her primary care and had appointment yesterday and reviewed labs that were drawn in the last week and were normal.  States that she is continued to follow with her anemia doctor at the cancer center and her iron levels have been stable.  Denies any recent sickness.  States fatigue is described as generalized feeling tired and having to push to get through activities.  Patient reports just prior to fatigue onset she did have a tick bite to her left wrist.  States tick was attached for approximately 24 hours.  States she removed the tick entirely.  Did report approximately 2 weeks ago she had a diffuse red rash to both arms and legs that was nontender and non-itchy that resolved on its own after 3 days.  Patient reports being concerned of tick related illness.  Does report she has had intermittent hip aching pain, in which she reports she has continued her Mobic which usually resolves her hip pain.  No injury.  Denies chest pain, shortness of breath, sore throat, p.o. intake changes, urinary or bowel changes, dysuria, headache or other changes.  Reports otherwise doing well.  [place] Pink, MD: PCP   Past Medical History:  Diagnosis Date  . Anemia   . Anxiety   . Arthritis    hands, fingers, lower back  . Complication of  anesthesia    loss control of bowels after block  . Depression   . Gastritis   . GERD (gastroesophageal reflux disease)   . Headache    hx of migraines in past  . Hypertension   . IDA (iron deficiency anemia) 03/17/2015  . IDA (iron deficiency anemia) 03/17/2015  . Neck pain    s/p fall approx 1 month ago.  Transient. "Random"    Patient Active Problem List   Diagnosis Date Noted  . 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 Gait abnormality 12/22/2017  . Paresthesia 12/22/2017  . Dietary iron deficiency 12/27/2016  . B12 deficiency 09/26/2016  . IDA (iron deficiency anemia) 03/17/2015  . Degenerative spondylolisthesis 03/14/2014    Past Surgical History:  Procedure Laterality Date  . ABDOMINAL HYSTERECTOMY  2008  . APPENDECTOMY    . BACK SURGERY  03/14/14   L4-5 laminectomy.  Dr Trenton Gammon, [facility]  . CHOLECYSTECTOMY    . COLONOSCOPY    . COLONOSCOPY WITH PROPOFOL N/A 05/13/2016   Procedure: COLONOSCOPY WITH PROPOFOL;  Surgeon: Lollie Sails, MD;  Location: [facility] ENDOSCOPY;  Service: Endoscopy;  Laterality: N/A;  . COLONOSCOPY WITH PROPOFOL N/A 05/20/2016   Procedure: COLONOSCOPY WITH PROPOFOL;  Surgeon: Lollie Sails, MD;  Location: [facility] ENDOSCOPY;  Service: Endoscopy;  Laterality: N/A;  . ESOPHAGOGASTRODUODENOSCOPY (EGD) WITH PROPOFOL N/A 05/13/2016   Procedure: ESOPHAGOGASTRODUODENOSCOPY (EGD) WITH PROPOFOL;  Surgeon: Lollie Sails, MD;  Location: [facility] ENDOSCOPY;  Service: Endoscopy;  Laterality: N/A;  . ESOPHAGOGASTRODUODENOSCOPY (EGD) WITH PROPOFOL N/A  10/12/2018   Procedure: ESOPHAGOGASTRODUODENOSCOPY (EGD) WITH PROPOFOL;  Surgeon: Christena DeemSkulskie, Martin U, MD;  Location: [facility] ENDOSCOPY;  Service: Endoscopy;  Laterality: N/A;  . GASTRIC BYPASS  2002  . JOINT REPLACEMENT Left 12/2013   [facility]  . PLANTAR FASCIA RELEASE Left 03/07/2016   Procedure: 1. Partial plantar fascial release with endoscopic procedure  2. Topaz fasciotomy percutaneously;  Surgeon: Recardo EvangelistMatthew  Troxler, DPM;  [facility];  Service: Podiatry;  Laterality: Left;  LMA WITH POPLITEAL TOPAZ  . TONSILLECTOMY       No current facility-administered medications for this encounter.   Current Outpatient Medications:  .  aspirin 81 MG tablet, Take 81 mg by mouth daily., Disp: , Rfl:  .  busPIRone (BUSPAR) 7.5 MG tablet, TAKE 1 TABLET (7.5 MG TOTAL) BY MOUTH 2 (TWO) TIMES DAILY, Disp: , Rfl: 1 .  co-enzyme Q-10 30 MG capsule, Take 30 mg by mouth daily. , Disp: , Rfl:  .  cyanocobalamin (,VITAMIN B-12,) 1000 MCG/ML injection, INJECT 1 ML (1,000 MCG TOTAL) INTO THE MUSCLE EVERY 14 (FOURTEEN) DAYS., Disp: 6 mL, Rfl: 0 .  fexofenadine (ALLEGRA) 180 MG tablet, Take by mouth., Disp: , Rfl:  .  gabapentin (NEURONTIN) 600 MG tablet, Take 600 mg by mouth daily. Takes a half of tablet in the am and half tablet at bedtime, Disp: , Rfl:  .  losartan (COZAAR) 50 MG tablet, Take 50 mg by mouth daily., Disp: , Rfl:  .  meloxicam (MOBIC) 15 MG tablet, take 1 tablet by mouth once daily, Disp: , Rfl:  .  Multiple Vitamin (MULTIVITAMIN) tablet, Take 1 tablet by mouth daily., Disp: , Rfl:  .  Omega-3 Fatty Acids (FISH OIL) 1200 MG CAPS, Take by mouth., Disp: , Rfl:  .  pantoprazole (PROTONIX) 40 MG tablet, TAKE 1 TABLET (40 MG TOTAL) BY MOUTH ONCE DAILY. 30 MINUTES PRIOR TO A MEAL., Disp: , Rfl: 2 .  sertraline (ZOLOFT) 100 MG tablet, Take 1 tablet (100 mg total) by mouth daily., Disp: 90 tablet, Rfl: 1 .  Vitamin D, Ergocalciferol, (DRISDOL) 50000 units CAPS capsule, Take 3 capsules by mouth once a week., Disp: , Rfl: 3 .  doxycycline (VIBRAMYCIN) 100 MG capsule, Take 1 capsule (100 mg total) by mouth 2 (two) times daily., Disp: 20 capsule, Rfl: 0 .  fluticasone (FLONASE) 50 MCG/ACT nasal spray, Place 1 spray into both nostrils daily., Disp: 16 g, Rfl: 2 .  Ibuprofen-diphenhydrAMINE Cit (ADVIL PM PO), Take 1 capsule by mouth daily., Disp: , Rfl:  .  sucralfate (CARAFATE) 1 g tablet, Take 1 g by  mouth 4 (four) times daily., Disp: , Rfl:   Allergies Prednisone  Family History  Problem Relation Age of Onset  . Diabetes Mother   . Stroke Mother   . Heart attack Father        died at age 59  . Heart disease Father   . Macular degeneration Maternal Grandmother   . Dementia Maternal Grandmother   . Prostate cancer Maternal Grandfather   . Heart attack Paternal Grandmother   . Heart disease Paternal Grandmother   . Heart attack Paternal Grandfather   . Heart disease Paternal Grandfather   . Breast cancer Neg Hx     Social History Social History   Tobacco Use  . Smoking status: Never Smoker  . Smokeless tobacco: Never Used  Substance Use Topics  . Alcohol use: Not Currently  . Drug use: No    Review of Systems Constitutional:  No fever ENT: No sore throat. Cardiovascular: Denies chest pain. Respiratory: Denies shortness of breath. Gastrointestinal: No abdominal pain.  No nausea, no vomiting.  No diarrhea.   Genitourinary: Negative for dysuria. Musculoskeletal: Negative for extremity edema. Skin: Negative for rash. Neurological: Negative for focal weakness or numbness.    ____________________________________________   PHYSICAL EXAM:  VITAL SIGNS: ED Triage Vitals  Enc Vitals Group     BP 03/26/19 1300 117/86     Pulse Rate 03/26/19 1300 72     Resp 03/26/19 1300 16     Temp 03/26/19 1300 98.2 F (36.8 C)     Temp Source 03/26/19 1300 Oral     SpO2 03/26/19 1300 100 %     Weight 03/26/19 1256 180 lb (81.6 kg)     Height 03/26/19 1256 5\' 3"  (1.6 m)     Head Circumference --      Peak Flow --      Pain Score 03/26/19 1256 0     Pain Loc --      Pain Edu? --      Excl. in GC? --     Constitutional: Alert and oriented. Well appearing and in no acute distress. ENT      Head: Normocephalic and atraumatic. Cardiovascular: Normal rate, regular rhythm. Grossly normal heart sounds.  Good peripheral circulation. Respiratory: Normal respiratory effort  without tachypnea nor retractions. Breath sounds are clear and equal bilaterally. No wheezes, rales, rhonchi. Musculoskeletal: Steady gait.  No lower extremity edema noted bilaterally. Neurologic:  Normal speech and language. Speech is normal. No gait instability.  Skin:  Skin is warm, dry and intact. No rash noted. Psychiatric: Mood and affect are normal. Speech and behavior are normal. Patient exhibits appropriate insight and judgment   ___________________________________________   LABS (all labs ordered are listed, but only abnormal results are displayed)  Labs Reviewed  ROCKY MTN SPOTTED FVR ABS PNL(IGG+IGM)  B. BURGDORFI ANTIBODIES    Via care everywhere recent labs reviewed from March 22, 2019, A1c normal, CBC appears stable, CMP also appears overall stable and consistent with patient baseline. ____________________________________________   PROCEDURES Procedures    INITIAL IMPRESSION / ASSESSMENT AND PLAN / ED COURSE  Pertinent labs & imaging results that were available during my care of the patient were reviewed by me and considered in my medical decision making (see chart for details).  Well-appearing patient.  No acute distress.  Presenting for complaint of fatigue and expressed concern of possible tick relation.  Discussed multiple differentials with patient regarding fatigue.  Patient has had lab studies completed in the last 1 week, and reviewed these.  Patient denies any acute worsening of the fatigue since labs.  Will defer repeat labs, patient agrees.  Will send off for Lyme and RMSF labs.  No recent antibiotic use.  Will empirically treat with oral doxycycline.  Strongly encourage patient to follow-up closely with her primary care regarding this.Discussed indication, risks and benefits of medications with patient.  Discussed follow up with Primary care physician this week. Discussed follow up and return parameters including no resolution or any worsening concerns.  Patient verbalized understanding and agreed to plan.   ____________________________________________   FINAL CLINICAL IMPRESSION(S) / ED DIAGNOSES  Final diagnoses:  Fatigue, unspecified type  Tick bite, initial encounter     ED Discharge Orders         Ordered    doxycycline (VIBRAMYCIN) 100 MG capsule  2 times daily     03/26/19 1323  Note: This dictation was prepared with Dragon dictation along with smaller phrase technology. Any transcriptional errors that result from this process are unintentional.         Renford DillsMiller, Leanndra Pember, NP 03/26/19 1356

## 2019-03-30 LAB — ROCKY MTN SPOTTED FVR ABS PNL(IGG+IGM)
RMSF IgG: NEGATIVE
RMSF IgM: 0.25 index (ref 0.00–0.89)

## 2019-03-30 LAB — B. BURGDORFI ANTIBODIES: B burgdorferi Ab IgG+IgM: <0.91 {ISR} (ref 0.00–0.90)

## 2019-04-02 ENCOUNTER — Telehealth (HOSPITAL_COMMUNITY): Payer: Self-pay | Admitting: Emergency Medicine

## 2019-04-02 NOTE — Telephone Encounter (Signed)
Normal labs. Patient contacted and made aware of all results, all questions answered. Will follow up with PCP for continued symptoms.

## 2019-06-06 ENCOUNTER — Other Ambulatory Visit: Payer: Self-pay | Admitting: Internal Medicine

## 2019-08-18 ENCOUNTER — Telehealth: Payer: Self-pay | Admitting: *Deleted

## 2019-08-18 DIAGNOSIS — E538 Deficiency of other specified B group vitamins: Secondary | ICD-10-CM

## 2019-08-18 DIAGNOSIS — D508 Other iron deficiency anemias: Secondary | ICD-10-CM

## 2019-08-18 NOTE — Telephone Encounter (Signed)
Patient called asking for refill of her B 12, but states she is still tired and wants to know if her dosing can be increased. She is also asking for a 90 day supply of medicine be sent to [facility] in [place]. Please advise

## 2019-08-18 NOTE — Telephone Encounter (Signed)
H/T- please inform pt that I have not seen pt for more than a year now. I cannot refill her medications-unless I repeat labs/ visit with pt.  If pt is willing- please schedule MD- Video [next week]; labs 1-2 days prior- cbc/cmp/B12  Thanks GB

## 2019-08-18 NOTE — Telephone Encounter (Signed)
Dr. Jacinto Reap - please advise. Her b12 injections are already prescribed twice a month.

## 2019-08-22 ENCOUNTER — Encounter: Payer: Self-pay | Admitting: Emergency Medicine

## 2019-08-22 ENCOUNTER — Other Ambulatory Visit: Payer: Self-pay

## 2019-08-22 ENCOUNTER — Ambulatory Visit
Admission: EM | Admit: 2019-08-22 | Discharge: 2019-08-22 | Disposition: A | Discharge status: Home / Self Care | Payer: Medicare Other | Attending: Emergency Medicine | Admitting: Emergency Medicine

## 2019-08-22 DIAGNOSIS — Z20828 Contact with and (suspected) exposure to other viral communicable diseases: Secondary | ICD-10-CM | POA: Insufficient documentation

## 2019-08-22 DIAGNOSIS — Z20822 Contact with and (suspected) exposure to covid-19: Secondary | ICD-10-CM

## 2019-08-22 NOTE — ED Provider Notes (Signed)
[facility] ____________________________________________  Time seen: Approximately 1:36 PM  I have reviewed the triage vital signs and the nursing notes.   HISTORY  Chief Complaint COVID Test   HPI [name] is a 58 y.o. female presenting for request of COVID-19 testing.  Patient states that her her husband has recently been sick prompting her to request COVID-19 testing.  Patient denies cough, chest pain, shortness of breath, sore throat, fever, change in taste or smell, vomiting or diarrhea.  States feels well.  Denies aggravating alleviating factors.   Past Medical History:  Diagnosis Date  . Anemia   . Anxiety   . Arthritis    hands, fingers, lower back  . Complication of anesthesia    loss control of bowels after block  . Depression   . Gastritis   . GERD (gastroesophageal reflux disease)   . Headache    hx of migraines in past  . Hypertension   . IDA (iron deficiency anemia) 03/17/2015  . IDA (iron deficiency anemia) 03/17/2015  . Neck pain    s/p fall approx 1 month ago.  Transient. "Random"    Patient Active Problem List   Diagnosis Date Noted  . 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 Gait abnormality 12/22/2017  . Paresthesia 12/22/2017  . Dietary iron deficiency 12/27/2016  . B12 deficiency 09/26/2016  . IDA (iron deficiency anemia) 03/17/2015  . Degenerative spondylolisthesis 03/14/2014    Past Surgical History:  Procedure Laterality Date  . ABDOMINAL HYSTERECTOMY  2008  . APPENDECTOMY    . BACK SURGERY  03/14/14   L4-5 laminectomy.  Dr Dutch Quint, [facility]  . CHOLECYSTECTOMY    . COLONOSCOPY    . COLONOSCOPY WITH PROPOFOL N/A 05/13/2016   Procedure: COLONOSCOPY WITH PROPOFOL;  Surgeon: [name], MD;  Location: [facility] ENDOSCOPY;  Service: Endoscopy;  Laterality: N/A;  . COLONOSCOPY WITH PROPOFOL N/A 05/20/2016   Procedure: COLONOSCOPY WITH PROPOFOL;  Surgeon: [name], MD;  Location: [facility] ENDOSCOPY;  Service: Endoscopy;   Laterality: N/A;  . ESOPHAGOGASTRODUODENOSCOPY (EGD) WITH PROPOFOL N/A 05/13/2016   Procedure: ESOPHAGOGASTRODUODENOSCOPY (EGD) WITH PROPOFOL;  Surgeon: [name], MD;  Location: [facility] ENDOSCOPY;  Service: Endoscopy;  Laterality: N/A;  . ESOPHAGOGASTRODUODENOSCOPY (EGD) WITH PROPOFOL N/A 10/12/2018   Procedure: ESOPHAGOGASTRODUODENOSCOPY (EGD) WITH PROPOFOL;  Surgeon: [name], MD;  Location: [facility] ENDOSCOPY;  Service: Endoscopy;  Laterality: N/A;  . GASTRIC BYPASS  2002  . JOINT REPLACEMENT Left 12/2013   hip replacement at [facility]  . PLANTAR FASCIA RELEASE Left 03/07/2016   Procedure: 1. Partial plantar fascial release with endoscopic procedure  2. Topaz fasciotomy percutaneously;  Surgeon: Recardo Evangelist, DPM;  Location[facility];  Service: Podiatry;  Laterality: Left;  LMA WITH POPLITEAL TOPAZ  . TONSILLECTOMY       No current facility-administered medications for this encounter.   Current Outpatient Medications:  .  aspirin 81 MG tablet, Take 81 mg by mouth daily., Disp: , Rfl:  .  busPIRone (BUSPAR) 7.5 MG tablet, TAKE 1 TABLET (7.5 MG TOTAL) BY MOUTH 2 (TWO) TIMES DAILY, Disp: , Rfl: 1 .  cyanocobalamin (,VITAMIN B-12,) 1000 MCG/ML injection, INJECT 1 ML (1,000 MCG TOTAL) INTO THE MUSCLE EVERY 14 (FOURTEEN) DAYS., Disp: 6 mL, Rfl: 0 .  fexofenadine (ALLEGRA) 180 MG tablet, Take by mouth., Disp: , Rfl:  .  gabapentin (NEURONTIN) 600 MG tablet, Take 600 mg by mouth daily. Takes a half of tablet in the am and half tablet at bedtime, Disp: ,  Rfl:  .  losartan (COZAAR) 50 MG tablet, Take 50 mg by mouth daily., Disp: , Rfl:  .  meloxicam (MOBIC) 15 MG tablet, take 1 tablet by mouth once daily, Disp: , Rfl:  .  Multiple Vitamin (MULTIVITAMIN) tablet, Take 1 tablet by mouth daily., Disp: , Rfl:  .  Omega-3 Fatty Acids (FISH OIL) 1200 MG CAPS, Take by mouth., Disp: , Rfl:  .  pantoprazole (PROTONIX) 40 MG tablet, TAKE 1 TABLET (40 MG TOTAL) BY MOUTH ONCE DAILY. 30  MINUTES PRIOR TO A MEAL., Disp: , Rfl: 2 .  sertraline (ZOLOFT) 100 MG tablet, Take 1 tablet (100 mg total) by mouth daily., Disp: 90 tablet, Rfl: 1 .  Vitamin D, Ergocalciferol, (DRISDOL) 50000 units CAPS capsule, Take 3 capsules by mouth once a week., Disp: , Rfl: 3 .  co-enzyme Q-10 30 MG capsule, Take 30 mg by mouth daily. , Disp: , Rfl:  .  doxycycline (VIBRAMYCIN) 100 MG capsule, Take 1 capsule (100 mg total) by mouth 2 (two) times daily., Disp: 20 capsule, Rfl: 0 .  fluticasone (FLONASE) 50 MCG/ACT nasal spray, Place 1 spray into both nostrils daily., Disp: 16 g, Rfl: 2 .  Ibuprofen-diphenhydrAMINE Cit (ADVIL PM PO), Take 1 capsule by mouth daily., Disp: , Rfl:  .  sucralfate (CARAFATE) 1 g tablet, Take 1 g by mouth 4 (four) times daily., Disp: , Rfl:   Allergies Prednisone  Family History  Problem Relation Age of Onset  . Diabetes Mother   . Stroke Mother   . Heart attack Father        died at age 59  . Heart disease Father   . Macular degeneration Maternal Grandmother   . Dementia Maternal Grandmother   . Prostate cancer Maternal Grandfather   . Heart attack Paternal Grandmother   . Heart disease Paternal Grandmother   . Heart attack Paternal Grandfather   . Heart disease Paternal Grandfather   . Breast cancer Neg Hx     Social History Social History   Tobacco Use  . Smoking status: Never Smoker  . Smokeless tobacco: Never Used  Substance Use Topics  . Alcohol use: Not Currently  . Drug use: No    Review of Systems Constitutional: No fever ENT: No sore throat. Cardiovascular: Denies chest pain. Respiratory: Denies shortness of breath. Gastrointestinal: No abdominal pain.  No nausea, no vomiting.  No diarrhea.  Genitourinary: Negative for dysuria. Musculoskeletal: Negative for back pain. Skin: Negative for rash.   ____________________________________________   PHYSICAL EXAM:  VITAL SIGNS: ED Triage Vitals  Enc Vitals Group     BP 08/22/19 1244 123/87      Pulse Rate 08/22/19 1244 77     Resp 08/22/19 1244 18     Temp 08/22/19 1244 97.6 F (36.4 C)     Temp Source 08/22/19 1244 Oral     SpO2 08/22/19 1244 100 %     Weight 08/22/19 1241 175 lb (79.4 kg)     Height 08/22/19 1241 5\' 4"  (1.626 m)     Head Circumference --      Peak Flow --      Pain Score 08/22/19 1241 0     Pain Loc --      Pain Edu? --      Excl. in GC? --     Constitutional: Alert and oriented. Well appearing and in no acute distress. Eyes: Conjunctivae are normal. ENT      Head: Normocephalic and atraumatic. Cardiovascular: Normal heart  rate. Respiratory: Normal respiratory effort without tachypnea nor retractions. Musculoskeletal:Steady gait.  Neurologic:  Normal speech and language.  Psychiatric: Mood and affect are normal. Speech and behavior are normal. Patient exhibits appropriate insight and judgment   ___________________________________________   LABS (all labs ordered are listed, but only abnormal results are displayed)  Labs Reviewed  NOVEL CORONAVIRUS, NAA (HOSP ORDER, SEND-OUT TO REF LAB; TAT 18-24 HRS)  ______________________________________   PROCEDURES Procedures   INITIAL IMPRESSION / ASSESSMENT AND PLAN / ED COURSE  Pertinent labs & imaging results that were available during my care of the patient were reviewed by me and considered in my medical decision making (see chart for details).  Well-appearing patient.  No acute distress.  COVID-19 testing requested, COVID-19 testing completed.  Supportive care monitor.  Discussed follow up and return parameters including no resolution or any worsening concerns. Patient verbalized understanding and agreed to plan.   ____________________________________________   FINAL CLINICAL IMPRESSION(S) / ED DIAGNOSES  Final diagnoses:  Encounter for screening laboratory testing for COVID-19 virus     ED Discharge Orders    None       Note: This dictation was prepared with Dragon dictation  along with smaller phrase technology. Any transcriptional errors that result from this process are unintentional.         Marylene Land, NP 08/22/19 1343

## 2019-08-22 NOTE — ED Triage Notes (Signed)
Patient here to have a COVID test because her husband has been sick and is also here to be tested.  Patient denies any symptoms.

## 2019-08-23 NOTE — Telephone Encounter (Signed)
Lab orders entered. I left a vm to request that patient to return clinic's phone call. Dr. B would like pt to have apts before adjustments for her meds can be made.

## 2019-08-24 ENCOUNTER — Other Ambulatory Visit: Payer: Self-pay

## 2019-08-24 ENCOUNTER — Inpatient Hospital Stay: Payer: Medicare Other | Attending: Internal Medicine

## 2019-08-24 DIAGNOSIS — D508 Other iron deficiency anemias: Secondary | ICD-10-CM | POA: Diagnosis present

## 2019-08-24 DIAGNOSIS — E538 Deficiency of other specified B group vitamins: Secondary | ICD-10-CM | POA: Diagnosis present

## 2019-08-24 LAB — CBC WITH DIFFERENTIAL/PLATELET
Abs Immature Granulocytes: 0.02 10*3/uL (ref 0.00–0.07)
Basophils Absolute: 0 10*3/uL (ref 0.0–0.1)
Basophils Relative: 0 %
Eosinophils Absolute: 0.3 10*3/uL (ref 0.0–0.5)
Eosinophils Relative: 4 %
HCT: 41.2 % (ref 36.0–46.0)
Hemoglobin: 12.6 g/dL (ref 12.0–15.0)
Immature Granulocytes: 0 %
Lymphocytes Relative: 33 %
Lymphs Abs: 1.9 10*3/uL (ref 0.7–4.0)
MCH: 30.3 pg (ref 26.0–34.0)
MCHC: 30.6 g/dL (ref 30.0–36.0)
MCV: 99 fL (ref 80.0–100.0)
Monocytes Absolute: 0.5 10*3/uL (ref 0.1–1.0)
Monocytes Relative: 8 %
Neutro Abs: 3.1 10*3/uL (ref 1.7–7.7)
Neutrophils Relative %: 55 %
Platelets: 213 10*3/uL (ref 150–400)
RBC: 4.16 MIL/uL (ref 3.87–5.11)
RDW: 13 % (ref 11.5–15.5)
WBC: 5.7 10*3/uL (ref 4.0–10.5)
nRBC: 0 % (ref 0.0–0.2)

## 2019-08-24 LAB — COMPREHENSIVE METABOLIC PANEL
ALT: 20 U/L (ref 0–44)
AST: 21 U/L (ref 15–41)
Albumin: 3.8 g/dL (ref 3.5–5.0)
Alkaline Phosphatase: 92 U/L (ref 38–126)
Anion gap: 4 — ABNORMAL LOW (ref 5–15)
BUN: 13 mg/dL (ref 6–20)
CO2: 27 mmol/L (ref 22–32)
Calcium: 8.6 mg/dL — ABNORMAL LOW (ref 8.9–10.3)
Chloride: 107 mmol/L (ref 98–111)
Creatinine, Ser: 0.54 mg/dL (ref 0.44–1.00)
GFR calc Af Amer: >60 mL/min (ref >60)
GFR calc non Af Amer: >60 mL/min (ref >60)
Glucose, Bld: 123 mg/dL — ABNORMAL HIGH (ref 70–99)
Potassium: 3.8 mmol/L (ref 3.5–5.1)
Sodium: 138 mmol/L (ref 135–145)
Total Bilirubin: 0.7 mg/dL (ref 0.3–1.2)
Total Protein: 7.1 g/dL (ref 6.5–8.1)

## 2019-08-24 LAB — NOVEL CORONAVIRUS, NAA (HOSP ORDER, SEND-OUT TO REF LAB; TAT 18-24 HRS): SARS-CoV-2, NAA: NOT DETECTED

## 2019-08-24 LAB — VITAMIN B12: Vitamin B-12: 1364 pg/mL — ABNORMAL HIGH (ref 180–914)

## 2019-08-26 ENCOUNTER — Telehealth: Payer: Self-pay | Admitting: *Deleted

## 2019-08-26 NOTE — Telephone Encounter (Signed)
Called pt and notified her of appt on Thursday December 10 @ 1045, via video per pt request.  Pt verbalized understanding of appt.

## 2019-09-01 ENCOUNTER — Other Ambulatory Visit: Payer: Self-pay

## 2019-09-01 NOTE — Progress Notes (Signed)
Patient had questions about Ferritin level and wants to increase b12, per telephone screen for visit

## 2019-09-02 ENCOUNTER — Inpatient Hospital Stay (HOSPITAL_BASED_OUTPATIENT_CLINIC_OR_DEPARTMENT_OTHER): Payer: Medicare Other | Admitting: Internal Medicine

## 2019-09-02 DIAGNOSIS — Z79899 Other long term (current) drug therapy: Secondary | ICD-10-CM

## 2019-09-02 DIAGNOSIS — E538 Deficiency of other specified B group vitamins: Secondary | ICD-10-CM

## 2019-09-02 DIAGNOSIS — E611 Iron deficiency: Secondary | ICD-10-CM | POA: Diagnosis not present

## 2019-09-02 DIAGNOSIS — R42 Dizziness and giddiness: Secondary | ICD-10-CM

## 2019-09-02 DIAGNOSIS — R531 Weakness: Secondary | ICD-10-CM

## 2019-09-02 DIAGNOSIS — R5383 Other fatigue: Secondary | ICD-10-CM | POA: Diagnosis not present
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
## 2019-09-02 NOTE — Progress Notes (Signed)
I connected with [name] on 09/02/2019 at 10:45 AM EST by video enabled telemedicine visit and verified that I am speaking with the correct person using two identifiers.  I discussed the limitations, risks, security and privacy concerns of performing an evaluation and management service by telemedicine and the availability of in-person appointments. I also discussed with the patient that there may be a patient responsible charge related to this service. The patient expressed understanding and agreed to proceed.    Other persons participating in the visit and their role in the encounter: RN/medical reconciliation Patient's location: Home Provider's location: Office  Oncology History   No history exists.    Chief Complaint: B12 deficiency/iron deficiency  History of present illness:[name] 58 y.o.  female with history of   Patient complains of chronic fatigue.  She also complains of intermittent low blood glucose dropping to 60s.  Complains of dizziness and weakness during these episodes.  She is not on any hypoglycemic agents.  Interested in increasing frequency of B12 injections.  Review of Systems  Constitutional: Positive for malaise/fatigue. Negative for chills, diaphoresis, fever and weight loss.  HENT: Negative for nosebleeds and sore throat.   Eyes: Negative for double vision.  Respiratory: Negative for cough, hemoptysis, sputum production, shortness of breath and wheezing.   Cardiovascular: Negative for chest pain, palpitations, orthopnea and leg swelling.  Gastrointestinal: Negative for abdominal pain, blood in stool, constipation, diarrhea, heartburn, melena, nausea and vomiting.  Genitourinary: Negative for dysuria, frequency and urgency.  Musculoskeletal: Negative for back pain and joint pain.  Skin: Negative.  Negative for itching and rash.  Neurological: Positive for dizziness and weakness. Negative for tingling, focal weakness and headaches.   Endo/Heme/Allergies: Does not bruise/bleed easily.  Psychiatric/Behavioral: Negative for depression. The patient is not nervous/anxious and does not have insomnia.     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 S/p IV ferrhem [feb 2019]. April 2020-  improved-iron saturation 31% hemoglobin 12.6.  Hold IV iron infusions.  #Intermittent low blood glucose-question etiology.  defer to PCP for further work-up.  # B12 sec to gastric bypass. On B12 IM at home [syringes given]; b12 elevated- >1000; discussed that I would not recommend increasing the frequency of B12 injections.  Continue B12 injections on a monthly basis.  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   # DISPOSITION:  # follow up in 4 months;MD; few days prior- labs-cbc/bmp/ferritin/iron studies/b12 levels/IV ferrahem posisble-Dr.B    Follow-up instructions:  I discussed the assessment and treatment plan with the patient.  The patient was provided an opportunity to ask questions and all were answered.  The patient agreed with the plan and demonstrated understanding of instructions.  The patient was advised to call back or seek an in person evaluation if the symptoms worsen or if the condition fails to improve as anticipated.  Dr. Charlaine Dalton Hammond at [facility] 09/03/2019 7:38 AM

## 2019-09-09 ENCOUNTER — Telehealth: Payer: Self-pay | Admitting: Internal Medicine

## 2019-09-09 DIAGNOSIS — E538 Deficiency of other specified B group vitamins: Secondary | ICD-10-CM

## 2019-09-09 MED ORDER — CYANOCOBALAMIN 1000 MCG/ML IJ SOLN: 1000.0000 ug | INTRAMUSCULAR | 4 refills | Status: DC

## 2019-09-09 NOTE — Addendum Note (Signed)
Addended by: Sabino Gasser on: 09/09/2019 02:24 PM   Modules accepted: Orders

## 2019-09-09 NOTE — Telephone Encounter (Signed)
Heather- I counseled the pt telling that I would recommend b12 every 4 weeks only; and not every 2 weeks as pt had requested.     Please change the b12 script to 1000 mcg every 4 weeks; 6 ml; # 4 refills.  Thanks GB

## 2019-12-23 ENCOUNTER — Other Ambulatory Visit: Payer: Self-pay | Admitting: Student

## 2019-12-23 DIAGNOSIS — G8929 Other chronic pain: Secondary | ICD-10-CM

## 2019-12-28 ENCOUNTER — Inpatient Hospital Stay: Payer: Medicare Other

## 2019-12-29 ENCOUNTER — Emergency Department: Payer: Medicare Other

## 2019-12-29 ENCOUNTER — Emergency Department
Admission: EM | Admit: 2019-12-29 | Discharge: 2019-12-29 | Discharge status: Against Medical Advice | Payer: Medicare Other | Attending: Student | Admitting: Student

## 2019-12-29 ENCOUNTER — Other Ambulatory Visit: Payer: Self-pay

## 2019-12-29 DIAGNOSIS — Z96642 Presence of left artificial hip joint: Secondary | ICD-10-CM | POA: Insufficient documentation

## 2019-12-29 DIAGNOSIS — Y999 Unspecified external cause status: Secondary | ICD-10-CM | POA: Diagnosis not present

## 2019-12-29 DIAGNOSIS — Y9389 Activity, other specified: Secondary | ICD-10-CM | POA: Diagnosis not present

## 2019-12-29 DIAGNOSIS — X088XXA Exposure to other specified smoke, fire and flames, initial encounter: Secondary | ICD-10-CM | POA: Insufficient documentation

## 2019-12-29 DIAGNOSIS — F419 Anxiety disorder, unspecified: Secondary | ICD-10-CM | POA: Insufficient documentation

## 2019-12-29 DIAGNOSIS — T22112A Burn of first degree of left forearm, initial encounter: Secondary | ICD-10-CM | POA: Diagnosis not present

## 2019-12-29 DIAGNOSIS — T2111XA Burn of first degree of chest wall, initial encounter: Secondary | ICD-10-CM | POA: Diagnosis not present

## 2019-12-29 DIAGNOSIS — I10 Essential (primary) hypertension: Secondary | ICD-10-CM | POA: Diagnosis not present

## 2019-12-29 DIAGNOSIS — T22111A Burn of first degree of right forearm, initial encounter: Secondary | ICD-10-CM | POA: Insufficient documentation

## 2019-12-29 DIAGNOSIS — T2017XA Burn of first degree of neck, initial encounter: Secondary | ICD-10-CM | POA: Insufficient documentation

## 2019-12-29 DIAGNOSIS — Z23 Encounter for immunization: Secondary | ICD-10-CM | POA: Insufficient documentation

## 2019-12-29 DIAGNOSIS — Z7982 Long term (current) use of aspirin: Secondary | ICD-10-CM | POA: Insufficient documentation

## 2019-12-29 DIAGNOSIS — Z5329 Procedure and treatment not carried out because of patient's decision for other reasons: Secondary | ICD-10-CM | POA: Insufficient documentation

## 2019-12-29 DIAGNOSIS — S199XXA Unspecified injury of neck, initial encounter: Secondary | ICD-10-CM | POA: Diagnosis present

## 2019-12-29 DIAGNOSIS — Z79899 Other long term (current) drug therapy: Secondary | ICD-10-CM | POA: Insufficient documentation

## 2019-12-29 DIAGNOSIS — Y92008 Other place in unspecified non-institutional (private) residence as the place of occurrence of the external cause: Secondary | ICD-10-CM | POA: Insufficient documentation

## 2019-12-29 DIAGNOSIS — Z20822 Contact with and (suspected) exposure to covid-19: Secondary | ICD-10-CM | POA: Insufficient documentation

## 2019-12-29 DIAGNOSIS — T2010XA Burn of first degree of head, face, and neck, unspecified site, initial encounter: Secondary | ICD-10-CM | POA: Insufficient documentation

## 2019-12-29 DIAGNOSIS — T300 Burn of unspecified body region, unspecified degree: Secondary | ICD-10-CM

## 2019-12-29 LAB — COMPREHENSIVE METABOLIC PANEL
ALT: 20 U/L (ref 0–44)
AST: 21 U/L (ref 15–41)
Albumin: 4.1 g/dL (ref 3.5–5.0)
Alkaline Phosphatase: 96 U/L (ref 38–126)
Anion gap: 5 (ref 5–15)
BUN: 16 mg/dL (ref 6–20)
CO2: 27 mmol/L (ref 22–32)
Calcium: 8.9 mg/dL (ref 8.9–10.3)
Chloride: 107 mmol/L (ref 98–111)
Creatinine, Ser: 0.67 mg/dL (ref 0.44–1.00)
GFR calc Af Amer: >60 mL/min (ref >60)
GFR calc non Af Amer: >60 mL/min (ref >60)
Glucose, Bld: 113 mg/dL — ABNORMAL HIGH (ref 70–99)
Potassium: 4.3 mmol/L (ref 3.5–5.1)
Sodium: 139 mmol/L (ref 135–145)
Total Bilirubin: 0.9 mg/dL (ref 0.3–1.2)
Total Protein: 7.4 g/dL (ref 6.5–8.1)

## 2019-12-29 LAB — CBC WITH DIFFERENTIAL/PLATELET
Abs Immature Granulocytes: 0.04 10*3/uL (ref 0.00–0.07)
Basophils Absolute: 0 10*3/uL (ref 0.0–0.1)
Basophils Relative: 0 %
Eosinophils Absolute: 0.3 10*3/uL (ref 0.0–0.5)
Eosinophils Relative: 3 %
HCT: 42.8 % (ref 36.0–46.0)
Hemoglobin: 13.5 g/dL (ref 12.0–15.0)
Immature Granulocytes: 0 %
Lymphocytes Relative: 20 %
Lymphs Abs: 2.3 10*3/uL (ref 0.7–4.0)
MCH: 30.6 pg (ref 26.0–34.0)
MCHC: 31.5 g/dL (ref 30.0–36.0)
MCV: 97.1 fL (ref 80.0–100.0)
Monocytes Absolute: 1.1 10*3/uL — ABNORMAL HIGH (ref 0.1–1.0)
Monocytes Relative: 9 %
Neutro Abs: 8.1 10*3/uL — ABNORMAL HIGH (ref 1.7–7.7)
Neutrophils Relative %: 68 %
Platelets: 253 10*3/uL (ref 150–400)
RBC: 4.41 MIL/uL (ref 3.87–5.11)
RDW: 12.9 % (ref 11.5–15.5)
WBC: 11.9 10*3/uL — ABNORMAL HIGH (ref 4.0–10.5)
nRBC: 0 % (ref 0.0–0.2)

## 2019-12-29 LAB — TROPONIN I (HIGH SENSITIVITY): Troponin I (High Sensitivity): 3 ng/L (ref <18)

## 2019-12-29 LAB — RESPIRATORY PANEL BY RT PCR (FLU A&B, COVID)
Influenza A by PCR: NEGATIVE
Influenza B by PCR: NEGATIVE
SARS Coronavirus 2 by RT PCR: NEGATIVE

## 2019-12-29 LAB — PROTIME-INR
INR: 1 (ref 0.8–1.2)
Prothrombin Time: 13.1 seconds (ref 11.4–15.2)

## 2019-12-29 LAB — TYPE AND SCREEN
ABO/RH(D): B POS
Antibody Screen: NEGATIVE

## 2019-12-29 MED ORDER — TETANUS-DIPHTH-ACELL PERTUSSIS 5-2.5-18.5 LF-MCG/0.5 IM SUSP
0.5000 mL | Freq: Once | INTRAMUSCULAR | Status: AC
  Administered 2019-12-29: 15:00:00 0.5 mL via INTRAMUSCULAR
  Filled 2019-12-29: qty 0.5

## 2019-12-29 MED ORDER — HYDROCODONE-ACETAMINOPHEN 5-325 MG PO TABS: 1.0000 | ORAL_TABLET | Freq: Four times a day (QID) | ORAL | 0 refills | Status: AC | PRN

## 2019-12-29 MED ORDER — MORPHINE SULFATE (PF) 2 MG/ML IV SOLN
2.0000 mg | Freq: Once | INTRAVENOUS | Status: AC
  Administered 2019-12-29: 2 mg via INTRAVENOUS
  Filled 2019-12-29: qty 1

## 2019-12-29 MED ORDER — LORAZEPAM 2 MG/ML IJ SOLN
1.0000 mg | Freq: Once | INTRAMUSCULAR | Status: AC
  Administered 2019-12-29: 1 mg via INTRAVENOUS
  Filled 2019-12-29: qty 1

## 2019-12-29 MED ORDER — MUPIROCIN 2 % EX OINT: TOPICAL_OINTMENT | CUTANEOUS | 0 refills | Status: DC
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
## 2019-12-29 NOTE — ED Notes (Signed)
Pt has decided to sign out AMA and not be transferred to [facility]. Pt states that she will follow up with [facility] on her own. Pt verbalized understanding that due to her condition she does not need to leave but states that she feels fine and does not feel the need to stay. EDP made aware.
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
## 2019-12-29 NOTE — ED Notes (Signed)
[facility] called with bed  1225 per [employer]
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
## 2019-12-31 ENCOUNTER — Inpatient Hospital Stay: Payer: Medicare Other | Admitting: Internal Medicine

## 2019-12-31 ENCOUNTER — Inpatient Hospital Stay: Payer: Medicare Other

## 2020-01-07 ENCOUNTER — Ambulatory Visit
Admission: RE | Admit: 2020-01-07 | Discharge: 2020-01-07 | Disposition: A | Discharge status: Home / Self Care | Payer: Medicare Other | Source: Ambulatory Visit | Attending: Student | Admitting: Student

## 2020-01-07 ENCOUNTER — Other Ambulatory Visit: Payer: Self-pay

## 2020-01-07 DIAGNOSIS — G8929 Other chronic pain: Secondary | ICD-10-CM | POA: Diagnosis present

## 2020-01-07 DIAGNOSIS — M5442 Lumbago with sciatica, left side: Secondary | ICD-10-CM | POA: Insufficient documentation

## 2020-01-14 ENCOUNTER — Other Ambulatory Visit: Payer: Self-pay | Admitting: Family Medicine

## 2020-01-14 DIAGNOSIS — Z1231 Encounter for screening mammogram for malignant neoplasm of breast: Secondary | ICD-10-CM

## 2020-01-21 ENCOUNTER — Ambulatory Visit
Admission: RE | Admit: 2020-01-21 | Discharge: 2020-01-21 | Disposition: A | Discharge status: Home / Self Care | Payer: Medicare Other | Source: Ambulatory Visit | Attending: Family Medicine | Admitting: Family Medicine

## 2020-01-21 DIAGNOSIS — Z1231 Encounter for screening mammogram for malignant neoplasm of breast: Secondary | ICD-10-CM | POA: Insufficient documentation

## 2020-03-03 ENCOUNTER — Other Ambulatory Visit: Payer: Self-pay | Admitting: Neurosurgery

## 2020-03-03 ENCOUNTER — Telehealth: Payer: Self-pay | Admitting: Neurosurgery

## 2020-03-03 DIAGNOSIS — M5136 Other intervertebral disc degeneration, lumbar region: Secondary | ICD-10-CM

## 2020-03-03 NOTE — Telephone Encounter (Signed)
03/03/20-Schd w patient/CT L Spine.

## 2020-03-13 ENCOUNTER — Ambulatory Visit: Payer: Medicare Other | Attending: Neurosurgery

## 2020-03-29 ENCOUNTER — Ambulatory Visit: Payer: Medicare Other | Attending: Neurosurgery

## 2020-04-11 ENCOUNTER — Other Ambulatory Visit: Payer: Self-pay

## 2020-04-11 ENCOUNTER — Emergency Department: Payer: Medicare Other

## 2020-04-11 ENCOUNTER — Emergency Department
Admission: EM | Admit: 2020-04-11 | Discharge: 2020-04-11 | Disposition: A | Discharge status: Home / Self Care | Payer: Medicare Other | Attending: Emergency Medicine | Admitting: Emergency Medicine

## 2020-04-11 DIAGNOSIS — Z7982 Long term (current) use of aspirin: Secondary | ICD-10-CM | POA: Insufficient documentation

## 2020-04-11 DIAGNOSIS — K6289 Other specified diseases of anus and rectum: Secondary | ICD-10-CM | POA: Insufficient documentation

## 2020-04-11 DIAGNOSIS — I10 Essential (primary) hypertension: Secondary | ICD-10-CM | POA: Diagnosis not present

## 2020-04-11 DIAGNOSIS — R6889 Other general symptoms and signs: Secondary | ICD-10-CM | POA: Diagnosis not present

## 2020-04-11 DIAGNOSIS — M5137 Other intervertebral disc degeneration, lumbosacral region: Secondary | ICD-10-CM

## 2020-04-11 LAB — CBC WITH DIFFERENTIAL/PLATELET
Abs Immature Granulocytes: 0.01 10*3/uL (ref 0.00–0.07)
Basophils Absolute: 0 10*3/uL (ref 0.0–0.1)
Basophils Relative: 0 %
Eosinophils Absolute: 0.1 10*3/uL (ref 0.0–0.5)
Eosinophils Relative: 3 %
HCT: 39 % (ref 36.0–46.0)
Hemoglobin: 11.9 g/dL — ABNORMAL LOW (ref 12.0–15.0)
Immature Granulocytes: 0 %
Lymphocytes Relative: 29 %
Lymphs Abs: 1.2 10*3/uL (ref 0.7–4.0)
MCH: 29.7 pg (ref 26.0–34.0)
MCHC: 30.5 g/dL (ref 30.0–36.0)
MCV: 97.3 fL (ref 80.0–100.0)
Monocytes Absolute: 0.5 10*3/uL (ref 0.1–1.0)
Monocytes Relative: 11 %
Neutro Abs: 2.4 10*3/uL (ref 1.7–7.7)
Neutrophils Relative %: 57 %
Platelets: 217 10*3/uL (ref 150–400)
RBC: 4.01 MIL/uL (ref 3.87–5.11)
RDW: 13.5 % (ref 11.5–15.5)
WBC: 4.3 10*3/uL (ref 4.0–10.5)
nRBC: 0 % (ref 0.0–0.2)

## 2020-04-11 LAB — URINALYSIS, COMPLETE (UACMP) WITH MICROSCOPIC
Bacteria, UA: NONE SEEN
Bilirubin Urine: NEGATIVE
Glucose, UA: NEGATIVE mg/dL
Hgb urine dipstick: NEGATIVE
Ketones, ur: 5 mg/dL — AB
Leukocytes,Ua: NEGATIVE
Nitrite: NEGATIVE
Protein, ur: NEGATIVE mg/dL
Specific Gravity, Urine: 1.023 (ref 1.005–1.030)
pH: 5 (ref 5.0–8.0)

## 2020-04-11 LAB — COMPREHENSIVE METABOLIC PANEL
ALT: 20 U/L (ref 0–44)
AST: 17 U/L (ref 15–41)
Albumin: 3.4 g/dL — ABNORMAL LOW (ref 3.5–5.0)
Alkaline Phosphatase: 72 U/L (ref 38–126)
Anion gap: 5 (ref 5–15)
BUN: 8 mg/dL (ref 6–20)
CO2: 24 mmol/L (ref 22–32)
Calcium: 7.6 mg/dL — ABNORMAL LOW (ref 8.9–10.3)
Chloride: 111 mmol/L (ref 98–111)
Creatinine, Ser: 0.6 mg/dL (ref 0.44–1.00)
GFR calc Af Amer: >60 mL/min (ref >60)
GFR calc non Af Amer: >60 mL/min (ref >60)
Glucose, Bld: 88 mg/dL (ref 70–99)
Potassium: 4 mmol/L (ref 3.5–5.1)
Sodium: 140 mmol/L (ref 135–145)
Total Bilirubin: 0.7 mg/dL (ref 0.3–1.2)
Total Protein: 6.7 g/dL (ref 6.5–8.1)

## 2020-04-11 MED ORDER — HYDROCODONE-ACETAMINOPHEN 5-325 MG PO TABS: 1.0000 | ORAL_TABLET | Freq: Three times a day (TID) | ORAL | 0 refills | Status: AC | PRN

## 2020-04-11 NOTE — Discharge Instructions (Addendum)
Your exam and MRI are reassuring at this time. Follow-up with GI medicine for further evaluation of rectal pain. Take the pain medicine as needed. Return as necessary.

## 2020-04-11 NOTE — ED Notes (Signed)
Patient reports "anal pain" that radiates to back and bilateral legs. She reports a burning sensation intermittently as well as a "loss of sensation" causing her to be incontinent and unaware of urge to go. She denies any blood in her stool.

## 2020-04-11 NOTE — ED Triage Notes (Addendum)
Pt arrives via POV for c/o pain "inside rectum". Pt reports chronic lower back pain x 6 months but now rectal area has been hurting x 1 week. Pt reports painful with bowel movements and painful with bending over. Pt ambulatory from lobby with slow but steady gait, skin warm and dry. Denies blood in stool.[name]

## 2020-04-11 NOTE — ED Provider Notes (Signed)
[facility] Emergency Department Provider Note ____________________________________________  Time seen: 1218  I have reviewed the triage vital signs and the nursing notes.  HISTORY  Chief Complaint  Rectal Pain  HPI [name] is a 59 y.o. female presents her self to the ED for evaluation of rectal pain and an episode of rectal incontinence last week.  She is a patient of Dr. Marcell Barlow, and made phone contact with him yesterday citing her symptoms.  He advised her to report to the ED immediately for further evaluation.  Patient admittedly did not report yesterday as advised, and presents today continuing to note intermittent rectal pain and pressure.  She denies any dark tarry stools, BRBPR, hematuria, urinary retention, urinary incontinence, back pain, distal paresthesias or saddle anesthesias.  She denies any nausea, vomiting, fever, chills, chest pain, or syncope.  She notes pain to the rectum when she changes positions.  She admits to a normal stool this morning just prior to arrival.  She does note however, that she had the sudden urge to stool within 5 minutes of eating her breakfast meal.  She reports making it to the toilet in time to pass a normal stool.  Patient is status post gastric bypass surgery from 20+ years prior.  She also has a history of bilateral lower extremity neuropathy, iron deficiency anemia, anxiety, depression, and osteoporosis.  She gets infusion therapy for her osteoporosis, and takes B12 shots and weekly Humira shots  Past Medical History:  Diagnosis Date  . Anemia   . Anxiety   . Arthritis    hands, fingers, lower back  . Complication of anesthesia    loss control of bowels after block  . Depression   . Gastritis   . GERD (gastroesophageal reflux disease)   . Headache    hx of migraines in past  . Hypertension   . IDA (iron deficiency anemia) 03/17/2015  . IDA (iron deficiency anemia) 03/17/2015  . Neck pain    s/p fall  approx 1 month ago.  Transient. "Random"    Patient Active Problem List   Diagnosis Date Noted  . 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 Gait abnormality 12/22/2017  . Paresthesia 12/22/2017  . Dietary iron deficiency 12/27/2016  . B12 deficiency 09/26/2016  . IDA (iron deficiency anemia) 03/17/2015  . Degenerative spondylolisthesis 03/14/2014    Past Surgical History:  Procedure Laterality Date  . ABDOMINAL HYSTERECTOMY  2008  . APPENDECTOMY    . BACK SURGERY  03/14/14   L4-5 laminectomy.  Dr Dutch Quint, [facility]  . CHOLECYSTECTOMY    . COLONOSCOPY    . COLONOSCOPY WITH PROPOFOL N/A 05/13/2016   Procedure: COLONOSCOPY WITH PROPOFOL;  Surgeon: [name], MD;  Location: [facility] ENDOSCOPY;  Service: Endoscopy;  Laterality: N/A;  . COLONOSCOPY WITH PROPOFOL N/A 05/20/2016   Procedure: COLONOSCOPY WITH PROPOFOL;  Surgeon: [name], MD;  Location: [facility] ENDOSCOPY;  Service: Endoscopy;  Laterality: N/A;  . ESOPHAGOGASTRODUODENOSCOPY (EGD) WITH PROPOFOL N/A 05/13/2016   Procedure: ESOPHAGOGASTRODUODENOSCOPY (EGD) WITH PROPOFOL;  Surgeon: [name], MD;  Location: [facility] ENDOSCOPY;  Service: Endoscopy;  Laterality: N/A;  . ESOPHAGOGASTRODUODENOSCOPY (EGD) WITH PROPOFOL N/A 10/12/2018   Procedure: ESOPHAGOGASTRODUODENOSCOPY (EGD) WITH PROPOFOL;  Surgeon: [name], MD;  Location: [facility] ENDOSCOPY;  Service: Endoscopy;  Laterality: N/A;  . GASTRIC BYPASS  2002  . JOINT REPLACEMENT Left 12/2013   hip replacement at [facility]  . PLANTAR FASCIA RELEASE Left 03/07/2016   Procedure: 1. Partial plantar fascial release with  endoscopic procedure  2. Topaz fasciotomy percutaneously;  Surgeon: Recardo EvangelistMatthew Troxler, DPM;  [facility];  Service: Podiatry;  Laterality: Left;  LMA WITH POPLITEAL TOPAZ  . TONSILLECTOMY      Prior to Admission medications   Medication Sig Start Date End Date Taking? Authorizing Provider  aspirin 81 MG tablet Take 81 mg by mouth daily.    [provider]  busPIRone (BUSPAR) 5 MG tablet Take 5 mg by mouth 2 (two) times daily.  03/25/18   [provider]  co-enzyme Q-10 30 MG capsule Take 30 mg by mouth daily.     [provider]  cyanocobalamin (,VITAMIN B-12,) 1000 MCG/ML injection Inject 1 mL (1,000 mcg total) into the muscle every 30 (thirty) days. 09/09/19   Earna CoderBrahmanday, Govinda R, MD  escitalopram (LEXAPRO) 10 MG tablet Take 10 mg by mouth daily.    [provider]  fexofenadine (ALLEGRA) 180 MG tablet Take by mouth.    [provider]  gabapentin (NEURONTIN) 600 MG tablet Take 600 mg by mouth 3 (three) times daily.     [provider]  HYDROcodone-acetaminophen (NORCO) 5-325 MG tablet Take 1 tablet by mouth 3 (three) times daily as needed for up to 7 days. 04/11/20 04/18/20  Kelly Ranieri, Charlesetta IvoryJenise V Bacon, PA-C  losartan (COZAAR) 50 MG tablet Take 50 mg by mouth daily. 11/20/16   [provider]  meloxicam (MOBIC) 15 MG tablet take 1 tablet by mouth once daily 07/31/15   [provider]  Multiple Vitamin (MULTIVITAMIN) tablet Take 1 tablet by mouth daily.    [provider]  mupirocin ointment (BACTROBAN) 2 % Apply to affected area 3 times daily 12/29/19 12/28/20  Miguel AschoffMonks, Sarah L., MD  Omega-3 Fatty Acids (FISH OIL) 1200 MG CAPS Take 1,200 mg by mouth daily.     [provider]  pantoprazole (PROTONIX) 40 MG tablet Take 40 mg by mouth 2 (two) times daily.  02/27/18   [provider]  sertraline (ZOLOFT) 100 MG tablet Take 1 tablet (100 mg total) by mouth daily. 04/11/18   Tommie Samsook, Jayce G, DO  sucralfate (CARAFATE) 1 g tablet Take 1 g by mouth 2 (two) times daily.     [provider]  traZODone (DESYREL) 100 MG tablet Take 100 mg by mouth at bedtime.    [provider]  Vitamin D, Ergocalciferol, (DRISDOL) 50000 units CAPS capsule Take 1 capsule by mouth 2 (two) times a week.  05/30/18   [provider]    Allergies Prednisone  Family  History  Problem Relation Age of Onset  . Diabetes Mother   . Stroke Mother   . Heart attack Father        died at age 60  . Heart disease Father   . Macular degeneration Maternal Grandmother   . Dementia Maternal Grandmother   . Prostate cancer Maternal Grandfather   . Heart attack Paternal Grandmother   . Heart disease Paternal Grandmother   . Heart attack Paternal Grandfather   . Heart disease Paternal Grandfather   . Breast cancer Neg Hx     Social History Social History   Tobacco Use  . Smoking status: Never Smoker  . Smokeless tobacco: Never Used  Vaping Use  . Vaping Use: Never used  Substance Use Topics  . Alcohol use: Not Currently  . Drug use: No    Review of Systems  Constitutional: Negative for fever. Eyes: Negative for visual changes. ENT: Negative for sore throat. Cardiovascular:  Negative for chest pain. Respiratory: Negative for shortness of breath. Gastrointestinal: Negative for abdominal pain, vomiting and diarrhea. Rectal pain. Genitourinary: Negative for dysuria. Musculoskeletal: Negative for back pain. Skin: Negative for rash. Neurological: Negative for headaches, focal weakness or numbness. ____________________________________________  PHYSICAL EXAM:  VITAL SIGNS: ED Triage Vitals  Enc Vitals Group     BP 04/11/20 1054 132/89     Pulse Rate 04/11/20 1054 84     Resp 04/11/20 1054 18     Temp 04/11/20 1054 98.1 F (36.7 C)     Temp Source 04/11/20 1054 Oral     SpO2 04/11/20 1054 100 %     Weight 04/11/20 1055 183 lb (83 kg)     Height 04/11/20 1055 5\' 3"  (1.6 m)     Head Circumference --      Peak Flow --      Pain Score 04/11/20 1054 9     Pain Loc --      Pain Edu? --      Excl. in GC? --     Constitutional: Alert and oriented. Well appearing and in no distress. Head: Normocephalic and atraumatic. Eyes: Conjunctivae are normal. Normal extraocular movements Cardiovascular: Normal rate, regular rhythm. Normal distal  pulses. Respiratory: Normal respiratory effort. No wheezes/rales/rhonchi. Gastrointestinal: Soft and nontender. No distention. Normal rectal tone. Negative stool guaiac.  Musculoskeletal: Nontender with normal range of motion in all extremities.  Neurologic:  Normal gait without ataxia. Normal speech and language. No gross focal neurologic deficits are appreciated. Skin:  Skin is warm, dry and intact. No rash noted. Psychiatric: Mood and affect are normal. Patient exhibits appropriate insight and judgment. ____________________________________________   LABS (pertinent positives/negatives) Labs Reviewed  COMPREHENSIVE METABOLIC PANEL - Abnormal; Notable for the following components:      Result Value   Calcium 7.6 (*)    Albumin 3.4 (*)    All other components within normal limits  CBC WITH DIFFERENTIAL/PLATELET - Abnormal; Notable for the following components:   Hemoglobin 11.9 (*)    All other components within normal limits  URINALYSIS, COMPLETE (UACMP) WITH MICROSCOPIC - Abnormal; Notable for the following components:   Color, Urine YELLOW (*)    APPearance CLEAR (*)    Ketones, ur 5 (*)    All other components within normal limits  POC OCCULT BLOOD, ED  ____________________________________________   RADIOLOGY  MRI Lumbar Spine   IMPRESSION: Redemonstrated sequela of prior L4-L5 posterior decompression and fusion. No significant canal or foraminal stenosis at this level.  At L3-L4, there is a disc bulge with superimposed broad-based left center to left foraminal disc protrusion. Advanced facet arthrosis with ligamentum flavum hypertrophy. Bilateral facet joint effusions. Moderate bilateral subarticular and central canal stenosis appears slightly progressed from MRI 04/01/2018. Unchanged bilateral neural foraminal narrowing (moderate right, severe left). Correlate for left L3 radiculopathy.  Lumbar spondylosis is otherwise unchanged. No significant spinal canal  stenosis at the remaining levels. Additional sites of neural foraminal narrowing greatest on the right at L5-S1 (moderate at this site). ____________________________________________  PROCEDURES  Procedures ____________________________________________  INITIAL IMPRESSION / ASSESSMENT AND PLAN / ED COURSE  ----------------------------------------- 1:52 PM on 04/11/2020 ----------------------------------------- S/W Dr. 04/13/2020 (neurosx) he will await MRI and see the patient in the ED for final disposition.   ----------------------------------------- 3:16 PM on 04/11/2020 ----------------------------------------- Christiane Zdeb in to see patient at bedside.  Patient is cleared by a neuro standpoint, after talking to C. Zdeb, PA-C via phone. She suggest perhaps GI evaluation into rectal pain.   -----------------------------------------  5:17 PM on 04/11/2020 ----------------------------------------- S/W Dr. Tawni Carnes: she is in agreement to outpatient follow-up since no emergent condition exists, patient is stable, and no signs of acute bleeding.    MARCENE LASKOWSKI was evaluated in Emergency Department on 04/11/2020 for the symptoms described in the history of present illness. She was evaluated in the context of the global COVID-19 pandemic, which necessitated consideration that the patient might be at risk for infection with the SARS-CoV-2 virus that causes COVID-19. Institutional protocols and algorithms that pertain to the evaluation of patients at risk for COVID-19 are in a state of rapid change based on information released by regulatory bodies including the CDC and federal and state organizations. These policies and algorithms were followed during the patient's care in the ED. ____________________________________________  FINAL CLINICAL IMPRESSION(S) / ED DIAGNOSES  Final diagnoses:  DDD (degenerative disc disease), lumbosacral  Suspected cauda equina syndrome  Rectal pain       Karmen Stabs, Charlesetta Ivory, PA-C 04/11/20 1722    Delton Prairie, MD 04/12/20 [phone]

## 2020-04-11 NOTE — ED Notes (Signed)
Pt talking to MRI on the phone.  

## 2020-04-11 NOTE — Consult Note (Signed)
Referring Physician:  No referring provider defined for this encounter.  Primary Physician:  [name], MD  Chief Complaint:  Rectal pain and lower back, left LE pain  History of Present Illness: [name] is a 59 y.o. female known to our clinic for management of her lumbar stenosis who called to report two episodes of bowel incontinence and increasing lower back and left leg pain starting approximately 2 days ago.[name] She was advised to present to the ED for evaluation due to these symptoms which were concerning for cauda equina syndrome.  On interview, she endorsed resolution of bowel incontinence and reports that she had a controlled, formed stool this morning.  She underwent an infusion with Reclast approximately July 15, and said that since that time, she noticed that she was experiencing increased rectal pain, and has had some diarrhea, as well as the episodes of stool incontinence.  When asked if she had any lower extremity weakness, she did say that immediately after her infusion, she felt "achy" all over and felt that her legs "could not support her".  She did not endorse any overt lower extremity weakness however.  Her pain starts in her lower back and goes down the lower back to the rectal area, down the left hip all the way around to the anterior thigh down the left leg to her ankle.  It is similar to the pain that she has previously had and which she is being seen by our clinic for.  She was evaluated by the [facility] ED provider, and a rectal exam was done which was intact.  She endorses that she has intact rectal sensation.  She does have a pessary in place and is therefore unable to comment on any urinary frequency/retention, although she reports that her urinary habits have not changed recently.  At her last visit with Dr. Myer Haff at the beginning of June, she was told to obtain a CT scan as well as work with endocrinology, and follow-up with Dr. Myer Haff 2.5 to 3  months after working with endocrinology for her osteoporosis.  She did have a repeat lumbar spine MRI today which has similar findings to the MRI obtained in April of this year.    [name] has no symptoms of cervical myelopathy or cauda equina on today's exam.  The symptoms are causing a significant impact on the patient's life.   Review of Systems:  A 10 point review of systems is negative, except for the pertinent positives and negatives detailed in the HPI.  Past Medical History: Past Medical History:  Diagnosis Date  . Anemia   . Anxiety   . Arthritis    hands, fingers, lower back  . Complication of anesthesia    loss control of bowels after block  . Depression   . Gastritis   . GERD (gastroesophageal reflux disease)   . Headache    hx of migraines in past  . Hypertension   . IDA (iron deficiency anemia) 03/17/2015  . IDA (iron deficiency anemia) 03/17/2015  . Neck pain    s/p fall approx 1 month ago.  Transient. "Random"    Past Surgical History: Past Surgical History:  Procedure Laterality Date  . ABDOMINAL HYSTERECTOMY  2008  . APPENDECTOMY    . BACK SURGERY  03/14/14   L4-5 laminectomy.  Dr Dutch Quint, [facility]  . CHOLECYSTECTOMY    . COLONOSCOPY    . COLONOSCOPY WITH PROPOFOL N/A 05/13/2016   Procedure: COLONOSCOPY WITH PROPOFOL;  Surgeon: Cindra Eves  Marva Panda, MD;  Location: [facility] ENDOSCOPY;  Service: Endoscopy;  Laterality: N/A;  . COLONOSCOPY WITH PROPOFOL N/A 05/20/2016   Procedure: COLONOSCOPY WITH PROPOFOL;  Surgeon: [name], MD;  [facility] ENDOSCOPY;  Service: Endoscopy;  Laterality: N/A;  . ESOPHAGOGASTRODUODENOSCOPY (EGD) WITH PROPOFOL N/A 05/13/2016   Procedure: ESOPHAGOGASTRODUODENOSCOPY (EGD) WITH PROPOFOL;  Surgeon: [name], MD;  Location: [facility] ENDOSCOPY;  Service: Endoscopy;  Laterality: N/A;  . ESOPHAGOGASTRODUODENOSCOPY (EGD) WITH PROPOFOL N/A 10/12/2018   Procedure: ESOPHAGOGASTRODUODENOSCOPY (EGD) WITH PROPOFOL;  Surgeon:  [name], MD;  Location: [facility] ENDOSCOPY;  Service: Endoscopy;  Laterality: N/A;  . GASTRIC BYPASS  2002  . JOINT REPLACEMENT Left 12/2013   hip replacement at [facility]  . PLANTAR FASCIA RELEASE Left 03/07/2016   Procedure: 1. Partial plantar fascial release with endoscopic procedure  2. Topaz fasciotomy percutaneously;  Surgeon: Recardo Evangelist, DPM;  Location: [facility];  Service: Podiatry;  Laterality: Left;  LMA WITH POPLITEAL TOPAZ  . TONSILLECTOMY      Allergies: Allergies as of 04/11/2020 - Review Complete 04/11/2020  Allergen Reaction Noted  . Prednisone Palpitations 04/23/2018    Medications: No current facility-administered medications for this encounter.  Current Outpatient Medications:  .  aspirin 81 MG tablet, Take 81 mg by mouth daily., Disp: , Rfl:  .  busPIRone (BUSPAR) 5 MG tablet, Take 5 mg by mouth 2 (two) times daily. , Disp: , Rfl: 1 .  co-enzyme Q-10 30 MG capsule, Take 30 mg by mouth daily. , Disp: , Rfl:  .  cyanocobalamin (,VITAMIN B-12,) 1000 MCG/ML injection, Inject 1 mL (1,000 mcg total) into the muscle every 30 (thirty) days., Disp: 6 mL, Rfl: 4 .  escitalopram (LEXAPRO) 10 MG tablet, Take 10 mg by mouth daily., Disp: , Rfl:  .  fexofenadine (ALLEGRA) 180 MG tablet, Take by mouth., Disp: , Rfl:  .  gabapentin (NEURONTIN) 600 MG tablet, Take 600 mg by mouth 3 (three) times daily. , Disp: , Rfl:  .  HYDROcodone-acetaminophen (NORCO) 5-325 MG tablet, Take 1 tablet by mouth 3 (three) times daily as needed for up to 7 days., Disp: 10 tablet, Rfl: 0 .  losartan (COZAAR) 50 MG tablet, Take 50 mg by mouth daily., Disp: , Rfl:  .  meloxicam (MOBIC) 15 MG tablet, take 1 tablet by mouth once daily, Disp: , Rfl:  .  Multiple Vitamin (MULTIVITAMIN) tablet, Take 1 tablet by mouth daily., Disp: , Rfl:  .  mupirocin ointment (BACTROBAN) 2 %, Apply to affected area 3 times daily, Disp: 22 g, Rfl: 0 .  Omega-3 Fatty Acids (FISH OIL) 1200 MG CAPS, Take 1,200 mg  by mouth daily. , Disp: , Rfl:  .  pantoprazole (PROTONIX) 40 MG tablet, Take 40 mg by mouth 2 (two) times daily. , Disp: , Rfl: 2 .  sertraline (ZOLOFT) 100 MG tablet, Take 1 tablet (100 mg total) by mouth daily., Disp: 90 tablet, Rfl: 1 .  sucralfate (CARAFATE) 1 g tablet, Take 1 g by mouth 2 (two) times daily. , Disp: , Rfl:  .  traZODone (DESYREL) 100 MG tablet, Take 100 mg by mouth at bedtime., Disp: , Rfl:  .  Vitamin D, Ergocalciferol, (DRISDOL) 50000 units CAPS capsule, Take 1 capsule by mouth 2 (two) times a week. , Disp: , Rfl: 3   Social History: Social History   Tobacco Use  . Smoking status: Never Smoker  . Smokeless tobacco: Never Used  Vaping Use  . Vaping Use: Never used  Substance  Use Topics  . Alcohol use: Not Currently  . Drug use: No    Family Medical History: Family History  Problem Relation Age of Onset  . Diabetes Mother   . Stroke Mother   . Heart attack Father        died at age 25  . Heart disease Father   . Macular degeneration Maternal Grandmother   . Dementia Maternal Grandmother   . Prostate cancer Maternal Grandfather   . Heart attack Paternal Grandmother   . Heart disease Paternal Grandmother   . Heart attack Paternal Grandfather   . Heart disease Paternal Grandfather   . Breast cancer Neg Hx     Physical Examination: Vitals:   04/11/20 1054  BP: 132/89  Pulse: 84  Resp: 18  Temp: 98.1 F (36.7 C)  SpO2: 100%     General: Patient is well developed, well nourished, calm, collected, and in no apparent distress.  Psychiatric: Patient is non-anxious.  Head:  Pupils equal, round, and reactive to light.  ENT:  Oral mucosa appears well hydrated.  Neck:   Supple.  Full range of motion.   NEUROLOGICAL:  General: In no acute distress.   Awake, alert, oriented to person, place, and time.  Pupils equal round and reactive to light.  Facial tone is symmetric.  Tongue protrusion is midline.  There is no pronator drift.  ROM of spine:  Deferred  Palpation of spine: Nontender throughout Gait is antalgic, per patient this is due to left lower extremity pain.  Strength: Side Biceps Triceps Deltoid Interossei Grip Wrist Ext. Wrist Flex.  R 5 5 5 5 5 5 5   L 5 5 5 5 5 5 5    Side Iliopsoas Quads Hamstring PF DF EHL  R 5 5 5 5 5 5   L 5 5 5 5 5 5    Reflexes are 2+ and symmetric at the patella and achilles.   Bilateral upper and lower extremity sensation is intact to light touch and pin prick. Rectal and buttock sensation is intact. Rectal tone intact (chaperone present).  Clonus is not present.     Imaging:  IMPRESSION: 1. Multilevel degenerative changes of the lumbar spine as described above, overall similar to prior study. 2. Findings remain worst at L3-L4 where there is unchanged moderate spinal canal, severe left, and moderate to severe right neuroforaminal stenosis. 3. Unchanged moderate to severe right neuroforaminal stenosis at L5-S1.   Assessment and Plan: Ms. Janak is a pleasant 59 y.o. female who presents today to the [facility] ED after reporting symptoms concerning for cauda equina syndrome.   I have seen and evaluated her, her neurological exam is intact. She currently shows no evidence of cauda equina syndrome. MRI from today was reviewed with Dr. .  From a neurosurgical standpoint, there is no acute neurosurgical intervention indicated at this time. We would recommend further evaluation of her GI symptoms, including rectal pain, as deemed necessary, as we do not feel that they are due to her known lumbar spine pathology.  I have discussed this with the patient, including showing the radiographs and discussing treatment options in layman's terms.   She should obtain the CT as previously ordered, make an appointment with the pain clinic for continued pain management, and schedule her appointment with Dr. Ward Chatters as discussed at her last visit in approximately 2-1/2 months.  I have agreed to prescribe  her some pain medication in the interim until she is seen by the pain clinic.   67  Asma Boldon, NP Dept. of Neurosurgery

## 2020-04-17 ENCOUNTER — Ambulatory Visit
Admission: RE | Admit: 2020-04-17 | Discharge: 2020-04-17 | Disposition: A | Discharge status: Home / Self Care | Payer: Medicare Other | Source: Ambulatory Visit | Attending: Neurosurgery | Admitting: Neurosurgery

## 2020-04-17 ENCOUNTER — Other Ambulatory Visit: Payer: Self-pay

## 2020-04-17 DIAGNOSIS — M4316 Spondylolisthesis, lumbar region: Secondary | ICD-10-CM | POA: Diagnosis present

## 2020-04-17 DIAGNOSIS — M5136 Other intervertebral disc degeneration, lumbar region: Secondary | ICD-10-CM | POA: Insufficient documentation

## 2020-05-08 ENCOUNTER — Other Ambulatory Visit: Payer: Self-pay | Admitting: *Deleted

## 2020-05-08 DIAGNOSIS — D508 Other iron deficiency anemias: Secondary | ICD-10-CM

## 2020-05-08 DIAGNOSIS — E611 Iron deficiency: Secondary | ICD-10-CM

## 2020-05-08 DIAGNOSIS — E538 Deficiency of other specified B group vitamins: Secondary | ICD-10-CM

## 2020-05-09 ENCOUNTER — Inpatient Hospital Stay: Payer: Medicare Other | Attending: Internal Medicine

## 2020-05-09 ENCOUNTER — Other Ambulatory Visit: Payer: Self-pay

## 2020-05-09 DIAGNOSIS — Z9884 Bariatric surgery status: Secondary | ICD-10-CM | POA: Insufficient documentation

## 2020-05-09 DIAGNOSIS — D508 Other iron deficiency anemias: Secondary | ICD-10-CM | POA: Insufficient documentation

## 2020-05-09 DIAGNOSIS — E538 Deficiency of other specified B group vitamins: Secondary | ICD-10-CM | POA: Insufficient documentation

## 2020-05-09 DIAGNOSIS — E611 Iron deficiency: Secondary | ICD-10-CM

## 2020-05-09 LAB — CBC WITH DIFFERENTIAL/PLATELET
Abs Immature Granulocytes: 0.01 10*3/uL (ref 0.00–0.07)
Basophils Absolute: 0 10*3/uL (ref 0.0–0.1)
Basophils Relative: 1 %
Eosinophils Absolute: 0.2 10*3/uL (ref 0.0–0.5)
Eosinophils Relative: 5 %
HCT: 39.1 % (ref 36.0–46.0)
Hemoglobin: 12.7 g/dL (ref 12.0–15.0)
Immature Granulocytes: 0 %
Lymphocytes Relative: 30 %
Lymphs Abs: 1.3 10*3/uL (ref 0.7–4.0)
MCH: 30.8 pg (ref 26.0–34.0)
MCHC: 32.5 g/dL (ref 30.0–36.0)
MCV: 94.9 fL (ref 80.0–100.0)
Monocytes Absolute: 0.3 10*3/uL (ref 0.1–1.0)
Monocytes Relative: 8 %
Neutro Abs: 2.4 10*3/uL (ref 1.7–7.7)
Neutrophils Relative %: 56 %
Platelets: 185 10*3/uL (ref 150–400)
RBC: 4.12 MIL/uL (ref 3.87–5.11)
RDW: 13.2 % (ref 11.5–15.5)
WBC: 4.2 10*3/uL (ref 4.0–10.5)
nRBC: 0 % (ref 0.0–0.2)

## 2020-05-09 LAB — FERRITIN: Ferritin: 61 ng/mL (ref 11–307)

## 2020-05-09 LAB — VITAMIN B12: Vitamin B-12: 488 pg/mL (ref 180–914)

## 2020-05-09 LAB — BASIC METABOLIC PANEL
Anion gap: 6 (ref 5–15)
BUN: 14 mg/dL (ref 6–20)
CO2: 28 mmol/L (ref 22–32)
Calcium: 8.5 mg/dL — ABNORMAL LOW (ref 8.9–10.3)
Chloride: 109 mmol/L (ref 98–111)
Creatinine, Ser: 0.6 mg/dL (ref 0.44–1.00)
GFR calc Af Amer: >60 mL/min (ref >60)
GFR calc non Af Amer: >60 mL/min (ref >60)
Glucose, Bld: 139 mg/dL — ABNORMAL HIGH (ref 70–99)
Potassium: 4.5 mmol/L (ref 3.5–5.1)
Sodium: 143 mmol/L (ref 135–145)

## 2020-05-09 LAB — IRON AND TIBC
Iron: 99 ug/dL (ref 28–170)
Saturation Ratios: 35 % — ABNORMAL HIGH (ref 10.4–31.8)
TIBC: 287 ug/dL (ref 250–450)
UIBC: 188 ug/dL

## 2020-05-10 ENCOUNTER — Telehealth: Payer: Self-pay | Admitting: *Deleted

## 2020-05-10 NOTE — Telephone Encounter (Signed)
Unable to accommodate patient today for IV iron, Colette, will call the patient for md/iron infusion apt (next available)

## 2020-05-10 NOTE — Telephone Encounter (Signed)
Patient called stating she saw results of ferritin and is asking if she can come in today for iron infusion. Please advise

## 2020-05-12 ENCOUNTER — Other Ambulatory Visit: Payer: Self-pay

## 2020-05-12 ENCOUNTER — Inpatient Hospital Stay (HOSPITAL_BASED_OUTPATIENT_CLINIC_OR_DEPARTMENT_OTHER): Payer: Medicare Other | Admitting: Internal Medicine

## 2020-05-12 ENCOUNTER — Inpatient Hospital Stay: Payer: Medicare Other

## 2020-05-12 VITALS — BP 118/82 | HR 61 | Temp 97.9°F | Resp 17

## 2020-05-12 DIAGNOSIS — D508 Other iron deficiency anemias: Secondary | ICD-10-CM

## 2020-05-12 DIAGNOSIS — E611 Iron deficiency: Secondary | ICD-10-CM | POA: Diagnosis not present

## 2020-05-12 MED ORDER — SODIUM CHLORIDE 0.9 % IV SOLN
Freq: Once | INTRAVENOUS | Status: AC
  Filled 2020-05-12: qty 250

## 2020-05-12 MED ORDER — SODIUM CHLORIDE 0.9 % IV SOLN
510.0000 mg | Freq: Once | INTRAVENOUS | Status: AC
  Administered 2020-05-12: 510 mg via INTRAVENOUS
  Filled 2020-05-12: qty 510

## 2020-05-12 NOTE — Progress Notes (Signed)
Pt tolerated infusion well. No s/s of distress or reaction noted. Pt and VS stable at discharge.  

## 2020-05-12 NOTE — Progress Notes (Signed)
[facility] OFFICE PROGRESS NOTE  Patient Care Team: [name], MD as PCP - General (Family Medicine) Lonell Face, MD as Consulting Physician (Neurology) Earna Coder, MD as Medical Oncologist (Medical Oncology)   SUMMARY OF ONCOLOGIC HISTORY:  # Iron deficiency/ restless leg syndrome- sec to gastric bypass- on IV iron infusion [Dr.Pandit]  #  B12 def sec to gastric Bypass [1998]; Motor neuropathy [dx-neurology]  INTERVAL HISTORY:  [name] patient with above history of gastric bypass in 1998- history of restless legs syndrome and fatigue secondary to functional iron deficiency visit for follow-up.   Patient noted to have worsening back pain for which she was evaluated by surgery.  Her surgery is on hold because of osteoporosis.  She is currently on Reclast.  She has multiple complaints including neuropathy with worsening tingling and numbness next images.  She is concerned about weight gain as she is not physically active given her back pain.   Patient had missed her appointments in April.    Review of Systems  Constitutional: Positive for malaise/fatigue. Negative for chills, diaphoresis, fever and weight loss.  HENT: Negative for nosebleeds and sore throat.   Eyes: Negative for double vision.  Respiratory: Negative for cough, hemoptysis, sputum production, shortness of breath and wheezing.   Cardiovascular: Negative for chest pain, palpitations, orthopnea and leg swelling.  Gastrointestinal: Negative for abdominal pain, blood in stool, constipation, diarrhea, heartburn, melena, nausea and vomiting.  Genitourinary: Negative for dysuria, frequency and urgency.  Musculoskeletal: Positive for back pain and joint pain.  Skin: Negative.  Negative for itching and rash.  Neurological: Negative for dizziness, tingling, focal weakness, weakness and headaches.  Endo/Heme/Allergies: Does not bruise/bleed easily.   Psychiatric/Behavioral: Positive for memory loss. Negative for depression. The patient is not nervous/anxious and does not have insomnia.      PAST MEDICAL HISTORY :  Past Medical History:  Diagnosis Date  . Anemia   . Anxiety   . Arthritis    hands, fingers, lower back  . Complication of anesthesia    loss control of bowels after block  . Depression   . Gastritis   . GERD (gastroesophageal reflux disease)   . Headache    hx of migraines in past  . Hypertension   . IDA (iron deficiency anemia) 03/17/2015  . IDA (iron deficiency anemia) 03/17/2015  . Neck pain    s/p fall approx 1 month ago.  Transient. "Random"    PAST SURGICAL HISTORY :   Past Surgical History:  Procedure Laterality Date  . ABDOMINAL HYSTERECTOMY  2008  . APPENDECTOMY    . BACK SURGERY  03/14/14   L4-5 laminectomy.  Dr Dutch Quint, [facility]  . CHOLECYSTECTOMY    . COLONOSCOPY    . COLONOSCOPY WITH PROPOFOL N/A 05/13/2016   Procedure: COLONOSCOPY WITH PROPOFOL;  Surgeon: [name], MD;  Location: [facility] ENDOSCOPY;  Service: Endoscopy;  Laterality: N/A;  . COLONOSCOPY WITH PROPOFOL N/A 05/20/2016   Procedure: COLONOSCOPY WITH PROPOFOL;  Surgeon: [name], MD;  Location: [facility] ENDOSCOPY;  Service: Endoscopy;  Laterality: N/A;  . ESOPHAGOGASTRODUODENOSCOPY (EGD) WITH PROPOFOL N/A 05/13/2016   Procedure: ESOPHAGOGASTRODUODENOSCOPY (EGD) WITH PROPOFOL;  Surgeon: [name], MD;  Location: [facility] ENDOSCOPY;  Service: Endoscopy;  Laterality: N/A;  . ESOPHAGOGASTRODUODENOSCOPY (EGD) WITH PROPOFOL N/A 10/12/2018   Procedure: ESOPHAGOGASTRODUODENOSCOPY (EGD) WITH PROPOFOL;  Surgeon: [name], MD;  Location: [facility] ENDOSCOPY;  Service: Endoscopy;  Laterality: N/A;  . GASTRIC BYPASS  2002  .  JOINT REPLACEMENT Left 12/2013   hip replacement at [facility]  . PLANTAR FASCIA RELEASE Left 03/07/2016   Procedure: 1. Partial plantar fascial release with endoscopic procedure  2. Topaz fasciotomy percutaneously;   Surgeon: Recardo EvangelistMatthew Troxler, DPM;  [facility];  Service: Podiatry;  Laterality: Left;  LMA WITH POPLITEAL TOPAZ  . TONSILLECTOMY      FAMILY HISTORY :   Family History  Problem Relation Age of Onset  . Diabetes Mother   . Stroke Mother   . Heart attack Father        died at age 60  . Heart disease Father   . Macular degeneration Maternal Grandmother   . Dementia Maternal Grandmother   . Prostate cancer Maternal Grandfather   . Heart attack Paternal Grandmother   . Heart disease Paternal Grandmother   . Heart attack Paternal Grandfather   . Heart disease Paternal Grandfather   . Breast cancer Neg Hx     SOCIAL HISTORY:   Social History   Tobacco Use  . Smoking status: Never Smoker  . Smokeless tobacco: Never Used  Vaping Use  . Vaping Use: Never used  Substance Use Topics  . Alcohol use: Not Currently  . Drug use: No    ALLERGIES:  is allergic to prednisone.  MEDICATIONS:  Current Outpatient Medications  Medication Sig Dispense Refill  . aspirin 81 MG tablet Take 81 mg by mouth daily.    . busPIRone (BUSPAR) 5 MG tablet Take 5 mg by mouth 2 (two) times daily.   1  . co-enzyme Q-10 30 MG capsule Take 30 mg by mouth daily.     . cyanocobalamin (,VITAMIN B-12,) 1000 MCG/ML injection Inject 1 mL (1,000 mcg total) into the muscle every 30 (thirty) days. 6 mL 4  . escitalopram (LEXAPRO) 10 MG tablet Take 10 mg by mouth daily.    . fexofenadine (ALLEGRA) 180 MG tablet Take by mouth.    [name]. HYDROcodone-acetaminophen (NORCO/VICODIN) 5-325 MG tablet Take by mouth.    . losartan (COZAAR) 50 MG tablet Take 50 mg by mouth daily.    . meloxicam (MOBIC) 15 MG tablet take 1 tablet by mouth once daily    . Multiple Vitamin (MULTIVITAMIN) tablet Take 1 tablet by mouth daily.    . mupirocin ointment (BACTROBAN) 2 % Apply to affected area 3 times daily 22 g 0  . Omega-3 Fatty Acids (FISH OIL) 1200 MG CAPS Take 1,200 mg by mouth daily.     . pantoprazole (PROTONIX) 40 MG  tablet Take 40 mg by mouth 2 (two) times daily.   2  . sertraline (ZOLOFT) 100 MG tablet Take 1 tablet (100 mg total) by mouth daily. 90 tablet 1  . sucralfate (CARAFATE) 1 g tablet Take 1 g by mouth 2 (two) times daily.     . traZODone (DESYREL) 100 MG tablet Take 100 mg by mouth at bedtime.    . Vitamin D, Ergocalciferol, (DRISDOL) 50000 units CAPS capsule Take 1 capsule by mouth 2 (two) times a week.   3  . gabapentin (NEURONTIN) 600 MG tablet Take 600 mg by mouth 3 (three) times daily.  (Patient not taking: Reported on 05/12/2020)     No current facility-administered medications for this visit.    PHYSICAL EXAMINATION:  BP 108/84 (BP Location: Left Arm, Patient Position: Sitting, Cuff Size: Large)   Pulse (!) 104   Temp 98.4 F (36.9 C) (Tympanic)   Resp 16   Ht 5\' 3"  (1.6 m)  Wt 180 lb (81.6 kg)   SpO2 96%   BMI 31.89 kg/m   Filed Weights   05/12/20 1322  Weight: 180 lb (81.6 kg)    Physical Exam HENT:     Head: Normocephalic and atraumatic.     Mouth/Throat:     Pharynx: No oropharyngeal exudate.  Eyes:     Pupils: Pupils are equal, round, and reactive to light.  Cardiovascular:     Rate and Rhythm: Normal rate and regular rhythm.  Pulmonary:     Effort: No respiratory distress.     Breath sounds: No wheezing.  Abdominal:     General: Bowel sounds are normal. There is no distension.     Palpations: Abdomen is soft. There is no mass.     Tenderness: There is no abdominal tenderness. There is no guarding or rebound.  Musculoskeletal:        General: No tenderness. Normal range of motion.     Cervical back: Normal range of motion and neck supple.  Skin:    General: Skin is warm.  Neurological:     Mental Status: She is alert and oriented to person, place, and time.  Psychiatric:        Mood and Affect: Affect normal.     LABORATORY DATA:  I have reviewed the data as listed    Component Value Date/Time   NA 143 05/09/2020 1114   NA 141 01/19/2014 0457    K 4.5 05/09/2020 1114   K 3.7 01/19/2014 0457   CL 109 05/09/2020 1114   CL 106 01/19/2014 0457   CO2 28 05/09/2020 1114   CO2 29 01/19/2014 0457   GLUCOSE 139 (H) 05/09/2020 1114   GLUCOSE 100 (H) 01/19/2014 0457   BUN 14 05/09/2020 1114   BUN 7 01/19/2014 0457   CREATININE 0.60 05/09/2020 1114   CREATININE 0.96 06/28/2014 0910   CALCIUM 8.5 (L) 05/09/2020 1114   CALCIUM 8.1 (L) 01/19/2014 0457   PROT 6.7 04/11/2020 1231   ALBUMIN 3.4 (L) 04/11/2020 1231   AST 17 04/11/2020 1231   ALT 20 04/11/2020 1231   ALKPHOS 72 04/11/2020 1231   BILITOT 0.7 04/11/2020 1231   GFRNONAA >60 05/09/2020 1114   GFRNONAA >60 06/28/2014 0910   GFRNONAA >60 01/19/2014 0457   GFRAA >60 05/09/2020 1114   GFRAA >60 06/28/2014 0910   GFRAA >60 01/19/2014 0457    No results found for: SPEP, UPEP  Lab Results  Component Value Date   WBC 4.2 05/09/2020   NEUTROABS 2.4 05/09/2020   HGB 12.7 05/09/2020   HCT 39.1 05/09/2020   MCV 94.9 05/09/2020   PLT 185 05/09/2020      Chemistry      Component Value Date/Time   NA 143 05/09/2020 1114   NA 141 01/19/2014 0457   K 4.5 05/09/2020 1114   K 3.7 01/19/2014 0457   CL 109 05/09/2020 1114   CL 106 01/19/2014 0457   CO2 28 05/09/2020 1114   CO2 29 01/19/2014 0457   BUN 14 05/09/2020 1114   BUN 7 01/19/2014 0457   CREATININE 0.60 05/09/2020 1114   CREATININE 0.96 06/28/2014 0910      Component Value Date/Time   CALCIUM 8.5 (L) 05/09/2020 1114   CALCIUM 8.1 (L) 01/19/2014 0457   ALKPHOS 72 04/11/2020 1231   AST 17 04/11/2020 1231   ALT 20 04/11/2020 1231   BILITOT 0.7 04/11/2020 1231       ASSESSMENT & PLAN:   Dietary iron  deficiency # 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 S/p IV ferrhem [feb 2019].  August 2021 improved-iron saturation 35% ferritin 61.  Hemoglobin 12.7.  As per patient's preference/ongoing fatigue reasonable to proceed with IV iron infusion today./See discussion below  # B12 sec to gastric  bypass. On B12 IM at home [syringes given]; continue home B12 injections.  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   #.Reviewed with the patient the iron studies over the last 2 years at least-where levels have been fairly within range.  Patient is interested in more frequent iron infusions.  However patient states that the normal range of iron saturation/ferritin might not be applicable to her condition.  Unfortunately today patient is quite upset that she has not been getting "appropriate care" [iron infusions] to help her symptoms-fatigue/neuropathy.  I will check with administration/colleagues regarding her request to switch providers.   # DISPOSITION:  # Proceed with Ferrahem today.  # follow up TBD- Dr.B     Earna Coder, MD 05/15/2020 2:59 PM

## 2020-05-12 NOTE — Assessment & Plan Note (Addendum)
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 S/p IV ferrhem [feb 2019].  August 2021 improved-iron saturation 35% ferritin 61.  Hemoglobin 12.7.  As per patient's preference/ongoing fatigue reasonable to proceed with IV iron infusion today./See discussion below  # B12 sec to gastric bypass. On B12 IM at home [syringes given]; continue home B12 injections.  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   #.Reviewed with the patient the iron studies over the last 2 years at least-where levels have been fairly within range.  Patient is interested in more frequent iron infusions.  However patient states that the normal range of iron saturation/ferritin might not be applicable to her condition.  Unfortunately today patient is quite upset that she has not been getting "appropriate care" [iron infusions] to help her symptoms-fatigue/neuropathy.  I will check with administration/colleagues regarding her request to switch providers.   # DISPOSITION:  # Proceed with Ferrahem today.  # follow up TBD- Dr.B

## 2020-05-23 ENCOUNTER — Telehealth: Payer: Self-pay | Admitting: *Deleted

## 2020-05-23 NOTE — Telephone Encounter (Signed)
Patient called reporting that she had requested another doctor at her last visit as she feels like she is not getting enough infusions because of her body. She is not sleeping well due to restlessness in her hands legs and body. She has no follow up appointments scheduled (last visit disposition said follow up TBD) She is asking for something to be ordered for her. Please advise

## 2020-05-23 NOTE — Telephone Encounter (Signed)
Ricki Rodriguez RN will reach out to patient to discuss her concerns.

## 2020-05-25 ENCOUNTER — Telehealth: Payer: Self-pay | Admitting: *Deleted

## 2020-05-25 NOTE — Telephone Encounter (Signed)
Spoke with the patient regarding her request to have a new provider for her care.  Her response was that Dr. Donneta Romberg was not treating her as her previous MD.   Explanation  to the patient that all of the other providers looked at her chart and agreed they would follow the same plan as Dr. Donneta Romberg.  The patient will be referred to another facility for care.

## 2020-05-26 ENCOUNTER — Telehealth: Payer: Self-pay

## 2020-05-26 ENCOUNTER — Telehealth: Payer: Self-pay | Admitting: Internal Medicine

## 2020-05-26 NOTE — Telephone Encounter (Signed)
Called Dr. Webb Silversmith office and left a message with the receptionist to have him call Dr. B directly on Tuesday when he comes in.

## 2020-05-26 NOTE — Telephone Encounter (Signed)
Had a long discussion with the patient's PCP- Dr. Doren Custard the patient's request for IV iron infusions with relatively normal iron levels/ferritin levels.  Discussed my inability to continue taking care of the patient; especially if she is unhappy with my care; and as patient personally wanted to switch care to other doctors.  Dr. Delia Chimes understands that other physicians group are reluctant to take the patient for same reasons as above.   Dr. Burnett Sheng will reach out to patient regarding opinion at [facility] or [facility].

## 2020-05-27 NOTE — Telephone Encounter (Signed)
Thank you GB. Good job.

## 2020-08-30 ENCOUNTER — Encounter: Payer: Self-pay | Admitting: Emergency Medicine

## 2020-08-30 ENCOUNTER — Other Ambulatory Visit: Payer: Self-pay

## 2020-08-30 ENCOUNTER — Emergency Department: Payer: Medicare Other

## 2020-08-30 ENCOUNTER — Other Ambulatory Visit: Payer: Self-pay | Admitting: Neurosurgery

## 2020-08-30 ENCOUNTER — Emergency Department
Admission: EM | Admit: 2020-08-30 | Discharge: 2020-08-30 | Disposition: A | Discharge status: Home / Self Care | Payer: Medicare Other | Attending: Emergency Medicine | Admitting: Emergency Medicine

## 2020-08-30 DIAGNOSIS — M5442 Lumbago with sciatica, left side: Secondary | ICD-10-CM | POA: Insufficient documentation

## 2020-08-30 DIAGNOSIS — G8929 Other chronic pain: Secondary | ICD-10-CM | POA: Diagnosis not present

## 2020-08-30 DIAGNOSIS — Z7982 Long term (current) use of aspirin: Secondary | ICD-10-CM | POA: Insufficient documentation

## 2020-08-30 DIAGNOSIS — M545 Low back pain, unspecified: Secondary | ICD-10-CM | POA: Diagnosis present

## 2020-08-30 DIAGNOSIS — Z96642 Presence of left artificial hip joint: Secondary | ICD-10-CM | POA: Insufficient documentation

## 2020-08-30 DIAGNOSIS — Z79899 Other long term (current) drug therapy: Secondary | ICD-10-CM | POA: Diagnosis not present

## 2020-08-30 DIAGNOSIS — I10 Essential (primary) hypertension: Secondary | ICD-10-CM | POA: Diagnosis not present

## 2020-08-30 DIAGNOSIS — R52 Pain, unspecified: Secondary | ICD-10-CM

## 2020-08-30 MED ORDER — OXYCODONE-ACETAMINOPHEN 5-325 MG PO TABS
1.0000 | ORAL_TABLET | Freq: Once | ORAL | Status: AC
  Administered 2020-08-30: 1 via ORAL
  Filled 2020-08-30: qty 1

## 2020-08-30 MED ORDER — KETOROLAC TROMETHAMINE 30 MG/ML IJ SOLN
30.0000 mg | Freq: Once | INTRAMUSCULAR | Status: AC
  Administered 2020-08-30: 30 mg via INTRAMUSCULAR
  Filled 2020-08-30: qty 1

## 2020-08-30 MED ORDER — OXYCODONE-ACETAMINOPHEN 5-325 MG PO TABS
1.0000 | ORAL_TABLET | Freq: Three times a day (TID) | ORAL | 0 refills | Status: AC | PRN
Start: 2020-08-30 — End: 2020-09-03

## 2020-08-30 NOTE — Discharge Instructions (Addendum)
Follow-up with your primary provider, neurologist, and pain management specialist as scheduled.  Increase your gabapentin to 3 times a day dosing as discussed.  Consider increasing your pain medicine to 3 times a day over the weekend.

## 2020-08-30 NOTE — ED Provider Notes (Addendum)
[facility] Emergency Department Provider Note ____________________________________________  Time seen: 1104  I have reviewed the triage vital signs and the nursing notes.  HISTORY  Chief Complaint  Back Pain  HPI [name] is a 59 y.o. female presents her self to the ED for evaluation of acute on chronic low back pain.  Patient who is being followed by [facility] neurology, presents with increased low back pain after she fell felt a "pop" in her lower back while hanging Christmas decorations a few days ago.  She denies any outright fall, bladder or bowel incontinence, foot drop, or saddle anesthesia.  She does describe increased pain in the left groin that radiates to her back and down to her left ankle.  She takes hydrocodone but describes but her twice a day dosing is not alleviated her pain.  She has been in touch with neurology and is scheduled to have a L3-4 lateral interbody fusion with plating on 09/27/2020, and had an ESI appointment moved up to 12/13.  Past Medical History:  Diagnosis Date  . Anemia   . Anxiety   . Arthritis    hands, fingers, lower back  . Complication of anesthesia    loss control of bowels after block  . Depression   . Gastritis   . GERD (gastroesophageal reflux disease)   . Headache    hx of migraines in past  . Hypertension   . IDA (iron deficiency anemia) 03/17/2015  . IDA (iron deficiency anemia) 03/17/2015  . Neck pain    s/p fall approx 1 month ago.  Transient. "Random"    Patient Active Problem List   Diagnosis Date Noted  . 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 Gait abnormality 12/22/2017  . Paresthesia 12/22/2017  . Dietary iron deficiency 12/27/2016  . B12 deficiency 09/26/2016  . IDA (iron deficiency anemia) 03/17/2015  . Degenerative spondylolisthesis 03/14/2014    Past Surgical History:  Procedure Laterality Date  . ABDOMINAL HYSTERECTOMY  2008  . APPENDECTOMY    . BACK SURGERY  03/14/14   L4-5  laminectomy.  Dr Dutch Quint, [facility]  . CHOLECYSTECTOMY    . COLONOSCOPY    . COLONOSCOPY WITH PROPOFOL N/A 05/13/2016   Procedure: COLONOSCOPY WITH PROPOFOL;  Surgeon: [name], MD;  Location: [facility] ENDOSCOPY;  Service: Endoscopy;  Laterality: N/A;  . COLONOSCOPY WITH PROPOFOL N/A 05/20/2016   Procedure: COLONOSCOPY WITH PROPOFOL;  Surgeon: [name], MD;  Location: [facility] ENDOSCOPY;  Service: Endoscopy;  Laterality: N/A;  . ESOPHAGOGASTRODUODENOSCOPY (EGD) WITH PROPOFOL N/A 05/13/2016   Procedure: ESOPHAGOGASTRODUODENOSCOPY (EGD) WITH PROPOFOL;  Surgeon: [name], MD;  Location: [facility] ENDOSCOPY;  Service: Endoscopy;  Laterality: N/A;  . ESOPHAGOGASTRODUODENOSCOPY (EGD) WITH PROPOFOL N/A 10/12/2018   Procedure: ESOPHAGOGASTRODUODENOSCOPY (EGD) WITH PROPOFOL;  Surgeon: [name], MD;  Location: [facility] ENDOSCOPY;  Service: Endoscopy;  Laterality: N/A;  . GASTRIC BYPASS  2002  . JOINT REPLACEMENT Left 12/2013   hip replacement at [facility]  . PLANTAR FASCIA RELEASE Left 03/07/2016   Procedure: 1. Partial plantar fascial release with endoscopic procedure  2. Topaz fasciotomy percutaneously;  Surgeon: Recardo Evangelist, DPM;  Location: [facility];  Service: Podiatry;  Laterality: Left;  LMA WITH POPLITEAL TOPAZ  . TONSILLECTOMY      Prior to Admission medications   Medication Sig Start Date End Date Taking? Authorizing Provider  aspirin 81 MG tablet Take 81 mg by mouth daily.    [provider]  busPIRone (BUSPAR) 5 MG tablet Take 5  mg by mouth 2 (two) times daily.  03/25/18   [provider]  co-enzyme Q-10 30 MG capsule Take 30 mg by mouth daily.     [provider]  cyanocobalamin (,VITAMIN B-12,) 1000 MCG/ML injection Inject 1 mL (1,000 mcg total) into the muscle every 30 (thirty) days. 09/09/19   Earna CoderBrahmanday, Govinda R, MD  escitalopram (LEXAPRO) 10 MG tablet Take 10 mg by mouth daily.    [provider]  fexofenadine (ALLEGRA) 180 MG  tablet Take by mouth.    [provider]  gabapentin (NEURONTIN) 600 MG tablet Take 600 mg by mouth 3 (three) times daily.  Patient not taking: Reported on 05/12/2020    [provider]  HYDROcodone-acetaminophen (NORCO/VICODIN) 5-325 MG tablet Take by mouth. 05/08/20   [provider]  losartan (COZAAR) 50 MG tablet Take 50 mg by mouth daily. 11/20/16   [provider]  meloxicam (MOBIC) 15 MG tablet take 1 tablet by mouth once daily 07/31/15   [provider]  Multiple Vitamin (MULTIVITAMIN) tablet Take 1 tablet by mouth daily.    [provider]  mupirocin ointment (BACTROBAN) 2 % Apply to affected area 3 times daily 12/29/19 12/28/20  Miguel AschoffMonks, Sarah L., MD  Omega-3 Fatty Acids (FISH OIL) 1200 MG CAPS Take 1,200 mg by mouth daily.     [provider]  oxyCODONE-acetaminophen (PERCOCET) 5-325 MG tablet Take 1 tablet by mouth 3 (three) times daily as needed for up to 4 days for severe pain. 08/30/20 09/03/20  Carlee Vonderhaar, Charlesetta IvoryJenise V Bacon, PA-C  pantoprazole (PROTONIX) 40 MG tablet Take 40 mg by mouth 2 (two) times daily.  02/27/18   [provider]  sertraline (ZOLOFT) 100 MG tablet Take 1 tablet (100 mg total) by mouth daily. 04/11/18   Tommie Samsook, Jayce G, DO  sucralfate (CARAFATE) 1 g tablet Take 1 g by mouth 2 (two) times daily.     [provider]  traZODone (DESYREL) 100 MG tablet Take 100 mg by mouth at bedtime.    [provider]  Vitamin D, Ergocalciferol, (DRISDOL) 50000 units CAPS capsule Take 1 capsule by mouth 2 (two) times a week.  05/30/18   [provider]    Allergies Prednisone  Family History  Problem Relation Age of Onset  . Diabetes Mother   . Stroke Mother   . Heart attack Father        died at age 60  . Heart disease Father   . Macular degeneration Maternal Grandmother   . Dementia Maternal Grandmother   . Prostate cancer Maternal Grandfather   . Heart attack Paternal Grandmother   . Heart  disease Paternal Grandmother   . Heart attack Paternal Grandfather   . Heart disease Paternal Grandfather   . Breast cancer Neg Hx     Social History Social History   Tobacco Use  . Smoking status: Never Smoker  . Smokeless tobacco: Never Used  Vaping Use  . Vaping Use: Never used  Substance Use Topics  . Alcohol use: Not Currently  . Drug use: No    Review of Systems  Constitutional: Negative for fever. Eyes: Negative for visual changes. ENT: Negative for sore throat. Cardiovascular: Negative for chest pain. Respiratory: Negative for shortness of breath. Gastrointestinal: Negative for abdominal pain, vomiting and diarrhea. Genitourinary: Negative for dysuria. Musculoskeletal: Positive for lower back pain with left lower extremity referral Skin: Negative for rash. Neurological: Negative for headaches, focal weakness or numbness. ____________________________________________  PHYSICAL EXAM:  VITAL SIGNS:  ED Triage Vitals [08/30/20 1022]  Enc Vitals Group     BP 127/81     Pulse Rate 93     Resp 20     Temp 98 F (36.7 C)     Temp Source Oral     SpO2 98 %     Weight 185 lb (83.9 kg)     Height 5\' 3"  (1.6 m)     Head Circumference      Peak Flow      Pain Score 10     Pain Loc      Pain Edu?      Excl. in GC?     Constitutional: Alert and oriented. Well appearing and in no distress. Head: Normocephalic and atraumatic. Eyes: Conjunctivae are normal. Normal extraocular movements Cardiovascular: Normal rate, regular rhythm. Normal distal pulses. Respiratory: Normal respiratory effort. No wheezes/rales/rhonchi. Gastrointestinal: Soft and nontender. No distention.  No CVA tenderness elicited. Musculoskeletal: Normal spinal alignment without midline tenderness, spasm, deformity, or step-off.  Patient nontender to palpation to the left SI joint.  She also reports some pain to the left inguinal crease.  She is able demonstrate normal hip flexion extension range.   Knee exam is also benign distally.  Nontender with normal range of motion in all extremities.  Neurologic: New nerves II through XII grossly intact.  Normal LE DTRs bilaterally.  Normal toe dorsiflexion foot eversion on exam.  Negative supine straight leg raise bilaterally.  Normal gait without ataxia. Normal speech and language. No gross focal neurologic deficits are appreciated. Skin:  Skin is warm, dry and intact. No rash noted. Psychiatric: Mood and affect are normal. Patient exhibits appropriate insight and judgment. ____________________________________________   RADIOLOGY  DG Left Hip w/ Pelvis  IMPRESSION: Left hip replacement.  No acute bony abnormality. ____________________________________________  PROCEDURES  Toradol 30 mg IM Oxycodone-APAP 5-325 mg PO  Procedures ____________________________________________  INITIAL IMPRESSION / ASSESSMENT AND PLAN / ED COURSE  Patient with chronic LBP and spinal stenosis, resents to the ED for sudden pain to the left hip and groin after mechanical injury.  Patient was evaluated for symptoms and found to have a normal hip x-ray of the left.  She was treated with pain medicines in the ED and advised to follow-up with her neurologist and pain management specialist as planned.  Patient reports that her pain is improved to a level of 3 out of 10 at the time of this disposition.  Patient was also advised to increase her gabapentin and hydrocodone at home.  A small prescription for Percocet is provided for her benefit.  Return precautions have been discussed.   [name] was evaluated in Emergency Department on 08/30/2020 for the symptoms described in the history of present illness. She was evaluated in the context of the global COVID-19 pandemic, which necessitated consideration that the patient might be at risk for infection with the SARS-CoV-2 virus that causes COVID-19. Institutional protocols and algorithms that pertain to the  evaluation of patients at risk for COVID-19 are in a state of rapid change based on information released by regulatory bodies including the CDC and federal and state organizations. These policies and algorithms were followed during the patient's care in the ED.  I reviewed the patient's prescription history over the last 12 months in the multi-state controlled substances database(s) that includes [place], [place], [place], [place], [place], [place], [place], [place], [place], [place], [place], [place], [place], and [place].  Results were notable for recent RX  noted.  ____________________________________________  FINAL CLINICAL IMPRESSION(S) / ED DIAGNOSES  Final diagnoses:  Chronic left-sided low back pain with left-sided sciatica      Karmen Stabs, Charlesetta Ivory, PA-C 08/30/20 1354    Dariel Pellecchia, Charlesetta Ivory, PA-C 08/30/20 1355    Shaune Pollack, MD 08/31/20 1214

## 2020-08-30 NOTE — ED Triage Notes (Signed)
Pt to ED via POV with c/o back pain, pt states scheduled to have surgery in December, states several days ago was putting up decorations when she felt a pop. Pt A&O x4, ambulatory without difficulty at this time.

## 2020-09-19 ENCOUNTER — Encounter
Admission: RE | Admit: 2020-09-19 | Discharge: 2020-09-19 | Disposition: A | Discharge status: Home / Self Care | Payer: Medicare Other | Source: Ambulatory Visit | Attending: Neurosurgery | Admitting: Neurosurgery

## 2020-09-19 ENCOUNTER — Other Ambulatory Visit: Payer: Self-pay

## 2020-09-19 DIAGNOSIS — Z01818 Encounter for other preprocedural examination: Secondary | ICD-10-CM | POA: Insufficient documentation

## 2020-09-19 DIAGNOSIS — I10 Essential (primary) hypertension: Secondary | ICD-10-CM | POA: Insufficient documentation

## 2020-09-19 LAB — BASIC METABOLIC PANEL
Anion gap: 6 (ref 5–15)
BUN: 12 mg/dL (ref 6–20)
CO2: 26 mmol/L (ref 22–32)
Calcium: 8.4 mg/dL — ABNORMAL LOW (ref 8.9–10.3)
Chloride: 105 mmol/L (ref 98–111)
Creatinine, Ser: 0.37 mg/dL — ABNORMAL LOW (ref 0.44–1.00)
GFR, Estimated: >60 mL/min (ref >60)
Glucose, Bld: 99 mg/dL (ref 70–99)
Potassium: 4 mmol/L (ref 3.5–5.1)
Sodium: 137 mmol/L (ref 135–145)

## 2020-09-19 LAB — SURGICAL PCR SCREEN
MRSA, PCR: NEGATIVE
Staphylococcus aureus: NEGATIVE

## 2020-09-19 LAB — TYPE AND SCREEN
ABO/RH(D): B POS
Antibody Screen: NEGATIVE

## 2020-09-19 LAB — CBC
HCT: 39.6 % (ref 36.0–46.0)
Hemoglobin: 12.6 g/dL (ref 12.0–15.0)
MCH: 32 pg (ref 26.0–34.0)
MCHC: 31.8 g/dL (ref 30.0–36.0)
MCV: 100.5 fL — ABNORMAL HIGH (ref 80.0–100.0)
Platelets: 216 10*3/uL (ref 150–400)
RBC: 3.94 MIL/uL (ref 3.87–5.11)
RDW: 12.7 % (ref 11.5–15.5)
WBC: 5.7 10*3/uL (ref 4.0–10.5)
nRBC: 0 % (ref 0.0–0.2)

## 2020-09-19 LAB — URINALYSIS, ROUTINE W REFLEX MICROSCOPIC
Bilirubin Urine: NEGATIVE
Glucose, UA: NEGATIVE mg/dL
Hgb urine dipstick: NEGATIVE
Ketones, ur: NEGATIVE mg/dL
Leukocytes,Ua: NEGATIVE
Nitrite: NEGATIVE
Protein, ur: NEGATIVE mg/dL
Specific Gravity, Urine: 1.011 (ref 1.005–1.030)
pH: 5 (ref 5.0–8.0)

## 2020-09-19 LAB — APTT: aPTT: 29 seconds (ref 24–36)

## 2020-09-19 LAB — PROTIME-INR
INR: 0.9 (ref 0.8–1.2)
Prothrombin Time: 12.2 seconds (ref 11.4–15.2)

## 2020-09-19 NOTE — Patient Instructions (Addendum)
Your procedure is scheduled on: 09/27/20 Report to DAY SURGERY DEPARTMENT LOCATED ON 2ND FLOOR MEDICAL MALL ENTRANCE. You must check in at the Admitting Desk also. To find out your arrival time please call [phone] between 1PM - 3PM on 09/26/20.  Remember: Instructions that are not followed completely may result in serious medical risk, up to and including death, or upon the discretion of your surgeon and anesthesiologist your surgery may need to be rescheduled.     _X__ 1. Do not eat food after midnight the night before your procedure.                 No gum chewing or hard candies. You may drink clear liquids up to 2 hours                 before you are scheduled to arrive for your surgery- DO not drink clear                 liquids within 2 hours of the start of your surgery.                 Clear Liquids include:  water, apple juice without pulp, clear carbohydrate                 drink such as Clearfast or Gatorade, Black Coffee or Tea (Do not add                 anything to coffee or tea). Diabetics water only  __X__2.  On the morning of surgery brush your teeth with toothpaste and water, you                 may rinse your mouth with mouthwash if you wish.  Do not swallow any              toothpaste of mouthwash.     _X__ 3.  No Alcohol for 24 hours before or after surgery.   _X__ 4.  Do Not Smoke or use e-cigarettes For 24 Hours Prior to Your Surgery.                 Do not use any chewable tobacco products for at least 6 hours prior to                 surgery.  ____  5.  Bring all medications with you on the day of surgery if instructed.   __X__  6.  Notify your doctor if there is any change in your medical condition      (cold, fever, infections).     Do not wear jewelry, make-up, hairpins, clips or nail polish. Do not wear lotions, powders, or perfumes.  Do not shave 48 hours prior to surgery. Men may shave face and neck. Do not bring valuables to the hospital.    [facility] is not responsible for any belongings or valuables.  Contacts, dentures/partials or body piercings may not be worn into surgery. Bring a case for your contacts, glasses or hearing aids, a denture cup will be supplied. Leave your suitcase in the car. After surgery it may be brought to your room. For patients admitted to the hospital, discharge time is determined by your treatment team.   Patients discharged the day of surgery will not be allowed to drive home.   Please read over the following fact sheets that you were given:   MRSA Information, CHG  __X__ Take these medicines the  morning of surgery with A SIP OF WATER:    1. gabapentin (NEURONTIN) 600 MG tablet  2. pantoprazole (PROTONIX) 40 MG tablet  3.   4.  5.  6.  ____ Fleet Enema (as directed)   __X__ Use CHG Soap/SAGE wipes as directed  ____ Use inhalers on the day of surgery  ____ Stop metformin/Janumet/Farxiga 2 days prior to surgery    ____ Take 1/2 of usual insulin dose the night before surgery. No insulin the morning          of surgery.   ____ Stop Blood Thinners Coumadin/Plavix/Xarelto/Pleta/Pradaxa/Eliquis/Effient/Aspirin  on   Or contact your Surgeon, Cardiologist or Medical Doctor regarding  ability to stop your blood thinners  __X__ Stop Anti-inflammatories 7 days before surgery such as Advil, Ibuprofen, Motrin,  BC or Goodies Powder, Naprosyn, Naproxen, Aleve, Aspirin   Stop Meloxicam and Advil 7 days before surgery  __X__ Stop all herbal supplements, fish oil or vitamin E until after surgery. Stop Omega 3, Co Q10 7 days before surgery   ____ Bring C-Pap to the hospital.

## 2020-09-25 ENCOUNTER — Other Ambulatory Visit
Admission: RE | Admit: 2020-09-25 | Discharge: 2020-09-25 | Disposition: A | Discharge status: Home / Self Care | Payer: Medicare Other | Source: Ambulatory Visit | Attending: Neurosurgery | Admitting: Neurosurgery

## 2020-09-25 DIAGNOSIS — Z20822 Contact with and (suspected) exposure to covid-19: Secondary | ICD-10-CM | POA: Diagnosis not present

## 2020-09-25 DIAGNOSIS — Z01812 Encounter for preprocedural laboratory examination: Secondary | ICD-10-CM | POA: Diagnosis present

## 2020-09-26 LAB — SARS CORONAVIRUS 2 (TAT 6-24 HRS): SARS Coronavirus 2: NEGATIVE

## 2020-10-09 ENCOUNTER — Other Ambulatory Visit: Admission: RE | Admit: 2020-10-09 | Payer: BC Managed Care – PPO | Source: Ambulatory Visit

## 2020-10-11 ENCOUNTER — Encounter: Admission: RE | Payer: Self-pay | Source: Ambulatory Visit

## 2020-10-11 ENCOUNTER — Inpatient Hospital Stay: Admission: RE | Admit: 2020-10-11 | Payer: Medicare Other | Source: Ambulatory Visit | Admitting: Neurosurgery

## 2020-10-11 SURGERY — ANTERIOR LATERAL LUMBAR FUSION WITH PERCUTANEOUS SCREW 1 LEVEL
Anesthesia: General

## 2020-10-19 ENCOUNTER — Encounter: Payer: Self-pay | Admitting: *Deleted

## 2020-10-25 ENCOUNTER — Other Ambulatory Visit: Payer: Self-pay | Admitting: Neurosurgery

## 2020-10-26 ENCOUNTER — Telehealth: Payer: Self-pay | Admitting: Internal Medicine

## 2020-10-26 NOTE — Telephone Encounter (Signed)
Returned patients call to discuss concerns and to inform her that Dr. Merlene Pulling is willing to see her in the [facility] clinic which may be more convenient as the patient lives in [place].  Also reached out to Dr. Merlene Pulling via IB to see if she needs labs drawn before her visit with the patient.

## 2020-10-27 ENCOUNTER — Other Ambulatory Visit: Payer: Self-pay

## 2020-10-27 NOTE — Telephone Encounter (Signed)
Spoke to patient and explained that Dr. Merlene Pulling has agreed to take over her care.  She will see her on Monday, 2/7 and Dr. Merlene Pulling will determine what the next steps are.  Patient was appreciative and acknowledge the change.  Also states [place] is more convenient distance wise for her.

## 2020-10-30 ENCOUNTER — Other Ambulatory Visit: Payer: Self-pay

## 2020-10-30 ENCOUNTER — Inpatient Hospital Stay: Payer: Medicare Other

## 2020-10-30 ENCOUNTER — Encounter: Payer: Self-pay | Admitting: Hematology and Oncology

## 2020-10-30 ENCOUNTER — Inpatient Hospital Stay: Payer: Medicare Other | Attending: Hematology and Oncology | Admitting: Hematology and Oncology

## 2020-10-30 VITALS — BP 133/96 | HR 72 | Temp 98.2°F | Resp 16 | Wt 184.5 lb

## 2020-10-30 DIAGNOSIS — D509 Iron deficiency anemia, unspecified: Secondary | ICD-10-CM | POA: Diagnosis not present

## 2020-10-30 DIAGNOSIS — R5383 Other fatigue: Secondary | ICD-10-CM | POA: Insufficient documentation

## 2020-10-30 DIAGNOSIS — Z9884 Bariatric surgery status: Secondary | ICD-10-CM | POA: Diagnosis present

## 2020-10-30 DIAGNOSIS — D508 Other iron deficiency anemias: Secondary | ICD-10-CM

## 2020-10-30 DIAGNOSIS — K9589 Other complications of other bariatric procedure: Secondary | ICD-10-CM | POA: Diagnosis not present

## 2020-10-30 DIAGNOSIS — D7589 Other specified diseases of blood and blood-forming organs: Secondary | ICD-10-CM | POA: Diagnosis not present

## 2020-10-30 DIAGNOSIS — M549 Dorsalgia, unspecified: Secondary | ICD-10-CM | POA: Diagnosis not present

## 2020-10-30 DIAGNOSIS — E538 Deficiency of other specified B group vitamins: Secondary | ICD-10-CM | POA: Diagnosis present

## 2020-10-30 DIAGNOSIS — G2581 Restless legs syndrome: Secondary | ICD-10-CM | POA: Diagnosis not present

## 2020-10-30 DIAGNOSIS — G629 Polyneuropathy, unspecified: Secondary | ICD-10-CM | POA: Diagnosis not present

## 2020-10-30 LAB — CBC WITH DIFFERENTIAL/PLATELET
Abs Immature Granulocytes: 0.01 10*3/uL (ref 0.00–0.07)
Basophils Absolute: 0 10*3/uL (ref 0.0–0.1)
Basophils Relative: 0 %
Eosinophils Absolute: 0.2 10*3/uL (ref 0.0–0.5)
Eosinophils Relative: 4 %
HCT: 40.5 % (ref 36.0–46.0)
Hemoglobin: 12.8 g/dL (ref 12.0–15.0)
Immature Granulocytes: 0 %
Lymphocytes Relative: 31 %
Lymphs Abs: 1.4 10*3/uL (ref 0.7–4.0)
MCH: 31.1 pg (ref 26.0–34.0)
MCHC: 31.6 g/dL (ref 30.0–36.0)
MCV: 98.3 fL (ref 80.0–100.0)
Monocytes Absolute: 0.4 10*3/uL (ref 0.1–1.0)
Monocytes Relative: 9 %
Neutro Abs: 2.6 10*3/uL (ref 1.7–7.7)
Neutrophils Relative %: 56 %
Platelets: 184 10*3/uL (ref 150–400)
RBC: 4.12 MIL/uL (ref 3.87–5.11)
RDW: 12.8 % (ref 11.5–15.5)
WBC: 4.6 10*3/uL (ref 4.0–10.5)
nRBC: 0 % (ref 0.0–0.2)

## 2020-10-30 LAB — RETICULOCYTES
Immature Retic Fract: 6.6 % (ref 2.3–15.9)
RBC.: 4.12 MIL/uL (ref 3.87–5.11)
Retic Count, Absolute: 76.6 10*3/uL (ref 19.0–186.0)
Retic Ct Pct: 1.9 % (ref 0.4–3.1)

## 2020-10-30 LAB — IRON AND TIBC
Iron: 92 ug/dL (ref 28–170)
Saturation Ratios: 31 % (ref 10.4–31.8)
TIBC: 300 ug/dL (ref 250–450)
UIBC: 208 ug/dL

## 2020-10-30 LAB — COMPREHENSIVE METABOLIC PANEL
ALT: 22 U/L (ref 0–44)
AST: 22 U/L (ref 15–41)
Albumin: 4 g/dL (ref 3.5–5.0)
Alkaline Phosphatase: 68 U/L (ref 38–126)
Anion gap: 12 (ref 5–15)
BUN: 19 mg/dL (ref 6–20)
CO2: 25 mmol/L (ref 22–32)
Calcium: 8.9 mg/dL (ref 8.9–10.3)
Chloride: 103 mmol/L (ref 98–111)
Creatinine, Ser: 0.58 mg/dL (ref 0.44–1.00)
GFR, Estimated: >60 mL/min (ref >60)
Glucose, Bld: 107 mg/dL — ABNORMAL HIGH (ref 70–99)
Potassium: 4.4 mmol/L (ref 3.5–5.1)
Sodium: 140 mmol/L (ref 135–145)
Total Bilirubin: 0.9 mg/dL (ref 0.3–1.2)
Total Protein: 7 g/dL (ref 6.5–8.1)

## 2020-10-30 LAB — FERRITIN: Ferritin: 104 ng/mL (ref 11–307)

## 2020-10-30 LAB — TSH: TSH: 1.273 u[IU]/mL (ref 0.350–4.500)

## 2020-10-30 LAB — T4, FREE: Free T4: 0.82 ng/dL (ref 0.61–1.12)

## 2020-10-30 LAB — C-REACTIVE PROTEIN: CRP: <0.5 mg/dL (ref <1.0)

## 2020-10-30 LAB — SEDIMENTATION RATE: Sed Rate: 13 mm/hr (ref 0–30)

## 2020-10-30 LAB — FOLATE: Folate: 15.7 ng/mL (ref >5.9)

## 2020-10-30 LAB — VITAMIN B12: Vitamin B-12: 288 pg/mL (ref 180–914)

## 2020-10-30 NOTE — Progress Notes (Signed)
[facility]  [address] [place], [place] [address] Phone: [phone]  Fax: [phone]   Clinic day:  10/30/2020  Chief Complaint: [name] is a 60 y.o. female s/p gastric bypass surgery (1998) with subsequent iron deficiency anemia and B12 deficiency who is seen for new patient assessment.  HPI: The patient underwent gastric bypass surgery in 1998.  She has been followed by Dr Lorre Nick and then Dr.Brahmanday for supplemental iron and B12.  She has a history of restless legs and fatigue secondary to functional iron deficiency.  Ferritin goal has been > 50.  She does not tolerate oral iron secondary to dyspepsia.  He has been on sublingual B12.  She has received Feraheme on several occasions: 06/07/2015, 06/16/2015, 12/27/2016, 10/24/2017 and 05/12/2020.  She received Venofer on 02/03/2015.  She received a B12 injection on 09/27/2016.  She is on home B12 injections.  CBC has been followed: 08/24/2019:  Hematocrit 41.2.  Hemoglobin 12.6.  MCV   99.0   Platelets 213,000.  WBC   5,700. 12/29/2019:  Hematocrit 42.8.  Hemoglobin 13.5.  MCV   97.1.  Platelets 253,000.  WBC 11,900. 04/11/2020:  Hematocrit 39.0.  Hemoglobin 11.9.  MCV   97.3.  Platelets 217,000.  WBC   4,300. 05/09/2020:  Hematocrit 39.1.  Hemoglobin 12.7.  MCV   94.9.  Platelets 185,000.  WBC   4,200 09/19/2020:  Hematocrit 39.6.  Hemoglobin 12.6.  MCV 100.5.  Platelets 216,000.  WBC   5,700.  Ferritin has been followed: 40 on 03/17/2015, 31 on 04/18/2015, 30 on 06/05/2015, 142 on 08/25/2015, 107102/06/2016, 85 on 03/13/2016, 68 on 06/14/2016, 46 on 12/13/2016, 113 on 03/05/2017, 122 on 04/25/2017, 90 on 10/24/2017, 135 on 01/14/2018, 101 on 02/27/2018, 106 on 06/29/2018, 79 on 01/04/2019 and 61 on 05/09/2020.  Iron saturation has been followed and has ranged between 8% and 37%.  Last iron studies on 05/09/2020 revealed a iron saturation of 35% with a TIBC of 391.  Vitamin B12 has  been followed: 974 on 08/25/2015, 526 on 09/27/2016, 2727 on 10/24/2017, 407106/03/2018, 1364 on 08/24/2019 and 488 on 05/09/2020.  He was last seen by Dr. Donneta Romberg on 05/12/2020.  Symptomatically, he noted multiple complaints including fatigue, back pain, and neuropathy with worsening tingling and numbness.  She was interested in more frequent IV iron infusions despite normal range for her ferritin and iron saturation.  She was interested in switching providers.  Colonoscopy on 05/20/2016 revealed a redundant colon.  EGD on 10/12/2018 revealed a variable Z-line variable.  There was LA Grade B erosive esophagitis. There was abnormal esophageal motility and bile gastritis. She is s/p Roux-en-Y gastrojejunostomy with gastrojejunal anastomosis characterized by an intact staple line and healthy appearing mucosa.  She has back pain.  She is planning on back surgery in the near future.  She has osteoporosis and is on Reclast.    Symptomatically, she states that she needs more frequent IV infusions to feel better.  She describes a sensation of "letting my legs".  She receives her B12 injections at home.  She states that she has B12 injections twice a month when she feels bad.  Injections are performed midmonth.   Past Medical History:  Diagnosis Date  . Anemia   . Anxiety   . Arthritis    hands, fingers, lower back  . Complication of anesthesia    loss control of bowels after block for hip replacement  . Depression   . Gastritis   . GERD (  gastroesophageal reflux disease)   . Headache    hx of migraines in past  . Hypertension   . IDA (iron deficiency anemia) 03/17/2015  . IDA (iron deficiency anemia) 03/17/2015  . Neck pain    s/p fall approx 1 month ago.  Transient. "Random"    Past Surgical History:  Procedure Laterality Date  . ABDOMINAL HYSTERECTOMY  2008  . APPENDECTOMY    . BACK SURGERY  03/14/14   L4-5 laminectomy.  Dr Dutch Quint, [facility]  . CHOLECYSTECTOMY    . COLONOSCOPY    .  COLONOSCOPY WITH PROPOFOL N/A 05/13/2016   Procedure: COLONOSCOPY WITH PROPOFOL;  Surgeon: [name], MD;  Location: [facility] ENDOSCOPY;  Service: Endoscopy;  Laterality: N/A;  . COLONOSCOPY WITH PROPOFOL N/A 05/20/2016   Procedure: COLONOSCOPY WITH PROPOFOL;  Surgeon: [name], MD;  Location: [facility] ENDOSCOPY;  Service: Endoscopy;  Laterality: N/A;  . ESOPHAGOGASTRODUODENOSCOPY (EGD) WITH PROPOFOL N/A 05/13/2016   Procedure: ESOPHAGOGASTRODUODENOSCOPY (EGD) WITH PROPOFOL;  Surgeon: [name], MD;  Location: [facility] ENDOSCOPY;  Service: Endoscopy;  Laterality: N/A;  . ESOPHAGOGASTRODUODENOSCOPY (EGD) WITH PROPOFOL N/A 10/12/2018   Procedure: ESOPHAGOGASTRODUODENOSCOPY (EGD) WITH PROPOFOL;  Surgeon: [name], MD;  Location: [facility] ENDOSCOPY;  Service: Endoscopy;  Laterality: N/A;  . GASTRIC BYPASS  2002  . JOINT REPLACEMENT Left 12/2013   hip replacement at [facility]  . PLANTAR FASCIA RELEASE Left 03/07/2016   Procedure: 1. Partial plantar fascial release with endoscopic procedure  2. Topaz fasciotomy percutaneously;  Surgeon: Recardo Evangelist, DPM;  Location: [facility];  Service: Podiatry;  Laterality: Left;  LMA WITH POPLITEAL TOPAZ  . TONSILLECTOMY      Family History  Problem Relation Age of Onset  . Diabetes Mother   . Stroke Mother   . Heart attack Father        died at age 66  . Heart disease Father   . Macular degeneration Maternal Grandmother   . Dementia Maternal Grandmother   . Prostate cancer Maternal Grandfather   . Heart attack Paternal Grandmother   . Heart disease Paternal Grandmother   . Heart attack Paternal Grandfather   . Heart disease Paternal Grandfather   . Breast cancer Neg Hx     Social History:  reports that she has never smoked. She has never used smokeless tobacco. She reports previous alcohol use. She reports that she does not use drugs.  She denies any exposure to radiation or toxins.  The patient lives in [place].  She is on  disability secondary to falls.  She was a IT sales professional.  Her husband works third shift.  The patient is alone today.  Allergies:  Allergies  Allergen Reactions  . Prednisone Palpitations    Will take if needed    Current Medications: Current Outpatient Medications  Medication Sig Dispense Refill  . aspirin 81 MG tablet Take 81 mg by mouth daily. (Patient not taking: Reported on 11/14/2020)    . busPIRone (BUSPAR) 5 MG tablet Take 5 mg by mouth 2 (two) times daily.  (Patient not taking: Reported on 11/14/2020)  1  . Calcium Carbonate-Vitamin D 600-400 MG-UNIT tablet Take 2 tablets by mouth 2 (two) times daily.    . Coenzyme Q10 100 MG capsule Take 100 mg by mouth daily.    . cyanocobalamin (,VITAMIN B-12,) 1000 MCG/ML injection Inject 1 mL (1,000 mcg total) into the muscle every 30 (thirty) days. 6 mL 4  . escitalopram (LEXAPRO) 10 MG tablet Take 10 mg by mouth  daily. (Patient not taking: Reported on 11/14/2020)    . fexofenadine (ALLEGRA) 180 MG tablet Take 180 mg by mouth daily as needed for allergies.    [name] gabapentin (NEURONTIN) 600 MG tablet Take 600 mg by mouth 2 (two) times daily.    [name] HYDROcodone-acetaminophen (NORCO/VICODIN) 5-325 MG tablet Take 1 tablet by mouth every 6 (six) hours as needed for severe pain.    . Ibuprofen-diphenhydrAMINE HCl (ADVIL PM) 200-25 MG CAPS Take 2-4 tablets by mouth at bedtime as needed. (Patient not taking: No sig reported)    . losartan (COZAAR) 50 MG tablet Take 50 mg by mouth daily.    . meloxicam (MOBIC) 15 MG tablet Take 15 mg by mouth daily.    . Multiple Vitamin (MULTIVITAMIN) tablet Take 1 tablet by mouth daily.    . Omega-3 Fatty Acids (FISH OIL) 1200 MG CAPS Take 1,200 mg by mouth daily.     . pantoprazole (PROTONIX) 40 MG tablet Take 40 mg by mouth 2 (two) times daily.   2  . sertraline (ZOLOFT) 100 MG tablet Take 1 tablet (100 mg total) by mouth daily. 90 tablet 1  . sucralfate (CARAFATE) 1 g tablet Take 1 g by mouth 2 (two) times daily.     .  Vitamin D, Ergocalciferol, (DRISDOL) 50000 units CAPS capsule Take 50,000 Units by mouth 2 (two) times a week.  3  . Calcium Carbonate-Vitamin D 600-200 MG-UNIT TABS Take by mouth. (Patient not taking: No sig reported)    . traZODone (DESYREL) 100 MG tablet Take 100-200 mg by mouth at bedtime as needed for sleep.     No current facility-administered medications for this visit.    Review of Systems  Constitutional: Positive for malaise/fatigue. Negative for chills, diaphoresis, fever and weight loss.  HENT: Negative for congestion, ear pain, nosebleeds, sinus pain and sore throat.   Eyes: Negative for double vision, photophobia and pain.       Macular degeneration.  Respiratory: Negative.  Negative for cough, hemoptysis, sputum production and shortness of breath.   Cardiovascular: Negative.  Negative for chest pain, palpitations, orthopnea, claudication and leg swelling.  Gastrointestinal: Negative for abdominal pain, blood in stool, constipation, diarrhea, heartburn, melena, nausea and vomiting.  Genitourinary: Negative for dysuria, flank pain, frequency, hematuria and urgency.       Pessary secondary to bladder prolapse.  Musculoskeletal: Positive for back pain. Negative for joint pain, myalgias and neck pain.  Skin: Negative.  Negative for itching and rash.  Neurological: Negative for dizziness, tremors, speech change, focal weakness, weakness and headaches.       Neuropathy. Restless legs.  Endo/Heme/Allergies: Negative for environmental allergies. Does not bruise/bleed easily.  Psychiatric/Behavioral: Negative for depression and memory loss. The patient is not nervous/anxious and does not have insomnia.    Vitals: Blood pressure (!) 133/96, pulse 72, temperature 98.2 F (36.8 C), temperature source Tympanic, resp. rate 16, weight 184 lb 8.4 oz (83.7 kg), SpO2 99 %.  Physical Exam Vitals and nursing note reviewed.  Constitutional:      General: She is not in acute distress.     Appearance: Normal appearance. She is not ill-appearing or diaphoretic.  HENT:     Head: Normocephalic and atraumatic.     Comments: Long hair.    Nose: Nose normal.     Mouth/Throat:     Mouth: Mucous membranes are moist.     Pharynx: Oropharynx is clear. No oropharyngeal exudate or posterior oropharyngeal erythema.  Eyes:  General: No scleral icterus.    Extraocular Movements: Extraocular movements intact.     Conjunctiva/sclera: Conjunctivae normal.     Pupils: Pupils are equal, round, and reactive to light.     Comments: Glasses.  Blue eyes.  Cardiovascular:     Rate and Rhythm: Normal rate and regular rhythm.     Heart sounds: Normal heart sounds.  Pulmonary:     Effort: Pulmonary effort is normal. No respiratory distress.     Breath sounds: No wheezing, rhonchi or rales.  Chest:  Breasts:     Right: No axillary adenopathy or supraclavicular adenopathy.     Left: No axillary adenopathy or supraclavicular adenopathy.    Abdominal:     General: Bowel sounds are normal. There is no distension.     Palpations: Abdomen is soft. There is no mass.     Tenderness: There is no abdominal tenderness. There is no guarding or rebound.  Musculoskeletal:     Cervical back: Normal range of motion and neck supple.     Right lower leg: No edema.     Left lower leg: No edema.  Lymphadenopathy:     Head:     Right side of head: No preauricular, posterior auricular or occipital adenopathy.     Left side of head: No preauricular or posterior auricular adenopathy.     Cervical: No cervical adenopathy.     Upper Body:     Right upper body: No supraclavicular or axillary adenopathy.     Left upper body: No supraclavicular or axillary adenopathy.     Lower Body: No right inguinal adenopathy. No left inguinal adenopathy.  Skin:    General: Skin is warm and dry.     Coloration: Skin is not jaundiced or pale.     Findings: No bruising, erythema or rash.  Neurological:     General: No focal  deficit present.     Mental Status: She is alert and oriented to person, place, and time.  Psychiatric:        Mood and Affect: Mood normal.        Behavior: Behavior normal.        Thought Content: Thought content normal.        Judgment: Judgment normal.     Office Visit on 10/30/2020  Component Date Value Ref Range Status  . Free T4 10/30/2020 0.82  0.61 - 1.12 ng/dL Final   Comment: (NOTE) Biotin ingestion may interfere with free T4 tests. If the results are inconsistent with the TSH level, previous test results, or the clinical presentation, then consider biotin interference. If needed, order repeat testing after stopping biotin. Performed at [facility], [address], [place], [place] [address]   . Retic Ct Pct 10/30/2020 1.9  0.4 - 3.1 % Final  . RBC. 10/30/2020 4.12  3.87 - 5.11 MIL/uL Final  . Retic Count, Absolute 10/30/2020 76.6  19.0 - 186.0 K/uL Final  . Immature Retic Fract 10/30/2020 6.6  2.3 - 15.9 % Final   Performed at [facility], [address], [place], [place] [address]  . TSH 10/30/2020 1.273  0.350 - 4.500 uIU/mL Final   Comment: Performed by a 3rd Generation assay with a functional sensitivity of <=0.01 uIU/mL. Performed at [facility], [address], [place], [place] [address]   . Folate 10/30/2020 15.7  >5.9 ng/mL Final   Performed at [facility], [address] [place] [place], [place] [address]  . Vitamin B-12 10/30/2020 288  180 - 914 pg/mL Final   Comment: (NOTE) This assay is not validated for testing neonatal or myeloproliferative syndrome specimens for Vitamin B12 levels. Performed at [facility], [address], [place], [place] [address]   . CRP 10/30/2020 <0.5  <1.0 mg/dL Final   Performed at [facility], [address], [place], [place] [address]  . Sed Rate 10/30/2020 13  0 - 30 mm/hr Final   Performed at [facility], [address], [place], [place] [address]  . Iron  10/30/2020 92  28 - 170 ug/dL Final  . TIBC 46/80/3212 300  250 - 450 ug/dL Final  . Saturation Ratios 10/30/2020 31  10.4 - 31.8 % Final  . UIBC 10/30/2020 208  ug/dL Final   Performed at [facility], [address] [place], [place] [address]  . Ferritin 10/30/2020 104  11 - 307 ng/mL Final   Performed at [facility], [address] [place] [place], [place] [address]  . Sodium 10/30/2020 140  135 - 145 mmol/L Final  . Potassium 10/30/2020 4.4  3.5 - 5.1 mmol/L Final  . Chloride 10/30/2020 103  98 - 111 mmol/L Final  . CO2 10/30/2020 25  22 - 32 mmol/L Final  . Glucose, Bld 10/30/2020 107* 70 - 99 mg/dL Final   Glucose reference range applies only to samples taken after fasting for at least 8 hours.  . BUN 10/30/2020 19  6 - 20 mg/dL Final  . Creatinine, Ser 10/30/2020 0.58  0.44 - 1.00 mg/dL Final  . Calcium 48/88/9169 8.9  8.9 - 10.3 mg/dL Final  . Total Protein 10/30/2020 7.0  6.5 - 8.1 g/dL Final  . Albumin 45/11/8880 4.0  3.5 - 5.0 g/dL Final  . AST 80/11/4915 22  15 - 41 U/L Final  . ALT 10/30/2020 22  0 - 44 U/L Final  . Alkaline Phosphatase 10/30/2020 68  38 - 126 U/L Final  . Total Bilirubin 10/30/2020 0.9  0.3 - 1.2 mg/dL Final  . GFR, Estimated 10/30/2020 >60  >60 mL/min Final   Comment: (NOTE) Calculated using the CKD-EPI Creatinine Equation (2021)   . Anion gap 10/30/2020 12  5 - 15 Final   Performed at [facility], [address], [place], [place] [address]  . WBC 10/30/2020 4.6  4.0 - 10.5 K/uL Final  . RBC 10/30/2020 4.12  3.87 - 5.11 MIL/uL Final  . Hemoglobin 10/30/2020 12.8  12.0 - 15.0 g/dL Final  . HCT 69/79/4801 40.5  36.0 - 46.0 % Final  . MCV 10/30/2020 98.3  80.0 - 100.0 fL Final  . MCH 10/30/2020 31.1  26.0 - 34.0 pg Final  . MCHC 10/30/2020 31.6  30.0 - 36.0 g/dL Final  . RDW 65/53/7482 12.8  11.5 - 15.5 % Final  . Platelets 10/30/2020 184  150 - 400 K/uL Final  . nRBC 10/30/2020 0.0  0.0 - 0.2 % Final  . Neutrophils  Relative % 10/30/2020 56  % Final  . Neutro Abs 10/30/2020 2.6  1.7 - 7.7 K/uL Final  . Lymphocytes Relative 10/30/2020 31  % Final  . Lymphs Abs 10/30/2020 1.4  0.7 - 4.0 K/uL Final  . Monocytes Relative 10/30/2020 9  % Final  . Monocytes Absolute 10/30/2020 0.4  0.1 - 1.0 K/uL Final  . Eosinophils Relative 10/30/2020 4  % Final  . Eosinophils Absolute 10/30/2020 0.2  0.0 - 0.5 K/uL Final  . Basophils Relative 10/30/2020 0  % Final  .  Basophils Absolute 10/30/2020 0.0  0.0 - 0.1 K/uL Final  . Immature Granulocytes 10/30/2020 0  % Final  . Abs Immature Granulocytes 10/30/2020 0.01  0.00 - 0.07 K/uL Final   Performed at [facility], [address] [place], [place] [id number]    Assessment:  [name] is a 60 y.o. female s/p gastric bypass (1998) and subsequent iron deficiency and B12 deficiency.  She is intolerant to oral iron.  She was initially on sublingual B12 then switched to B12 injections at home  She has received Feraheme: 06/07/2015, 06/16/2015, 12/27/2016, 10/24/2017 and 05/12/2020.  She received Venofer on 02/03/2015.    Ferritin has been followed: 40 on 03/17/2015, 31 on 04/18/2015, 30 on 06/05/2015, 142 on 08/25/2015, 107102/06/2016, 85 on 03/13/2016, 68 on 06/14/2016, 46 on 12/13/2016, 113 on 03/05/2017, 122 on 04/25/2017, 90 on 10/24/2017, 135 on 01/14/2018, 101 on 02/27/2018, 106 on 06/29/2018, 79 on 01/04/2019 and 61 on 05/09/2020.  Iron saturation has been followed and has ranged between 8% and 37%.  Last iron studies on 05/09/2020 revealed a iron saturation of 35% with a TIBC of 391.  Colonoscopy on 05/20/2016 revealed a redundant colon.  EGD on 10/12/2018 revealed a variable Z-line variable.  There was LA Grade B erosive esophagitis. There was abnormal esophageal motility and bile gastritis. She is s/p Roux-en-Y gastrojejunostomy with gastrojejunal anastomosis characterized by an intact staple line and healthy appearing mucosa.  Vitamin B12 has  been followed: 974 on 08/25/2015, 526 on 09/27/2016, 2727 on 10/24/2017, 407106/03/2018, 1364 on 08/24/2019 and 488 on 05/09/2020.  She has osteoporosis and receives Reclast.    Symptomatically, she notes fatigue possibly related to iron deficiency.  She has restless legs.  Exam is unremarkable.  Plan: 1.   Labs today:  CBC with diff, CMP, ferritin, iron studies, sed rate, CRP, B12, folate, TSH.   2.   Iron deficiency anemia  Review entire medical history and labs to date.  She is s/p gastric bypass surgery and has difficulty absorbing oral iron and intolerance.  Discuss IV iron to maintain a ferritin > 30 and iron saturation >= 18%.  Discuss consideration of switch from Feraheme to Venofer when IV iron needed. 3.   B12 deficiency  Patient has B12deficiency s/p gastric bypass.  Discuss B12 injections once a month at home.  Check B12 and folate levels annually. 4.   Fatigue  Discuss differential diagnosis for fatigue.   Etiology may not be secondary to iron deficiency.  Check thyroid function  Patient states she may have sleep apnea. 5.   Preauth Venofer, 6.   RTC in 1 week for MD assessment, review of labs and +/- Venofer.  I discussed the assessment and treatment plan with the patient.  The patient was provided an opportunity to ask questions and all were answered.  The patient agreed with the plan and demonstrated an understanding of the instructions.  The patient was advised to call back or seek an in person evaluation if the symptoms worsen or if the condition fails to improve as anticipated.   Rosey BathMelissa C Harbor Vanover, MD, PhD  10/30/2020, 5:00 PM

## 2020-11-01 ENCOUNTER — Other Ambulatory Visit: Payer: Self-pay | Admitting: Internal Medicine

## 2020-11-01 DIAGNOSIS — E538 Deficiency of other specified B group vitamins: Secondary | ICD-10-CM

## 2020-11-02 NOTE — Progress Notes (Signed)
[facility]  [address], [address] [place], [place] [address] Phone: [phone]  Fax: [phone]   Clinic day:  11/06/2020  Chief Complaint: [name] is a 60 y.o. female s/p gastric bypass surgery (1998) with subsequent iron deficiency anemia and B12 deficiency who is seen for review of work-up and discussion regarding direction of therapy.  HPI: The patient was last seen in the hematology clinic on 10/30/2020 for new patient assessment. She had previously been seen by Dr Donneta Romberg. At that time, she noted multiple complaints including fatigue, back pain, and neuropathy with worsening tingling and numbness. She was interested in more frequent IV iron infusions despite normal range for her ferritin and iron saturation.   Work-up revealed a hematocrit of 40.5, hemoglobin 12.8, platelets 184,000, and WBC 4,600 with an ANC of 2600. CMP was normal. Ferritin was 104 with an iron saturation of 31% and a TIBC of 300. Reticulocyte count was 1.9%. Sed rate was 13 and CRP < 0.5. TSH was 1.273 with a free T4 of 0.82. Vitamin B12 was 288 and folate 15.7.   During the interim, she has been "good." All of her symptoms are stable. She denies any new symptoms or complaints.  She receives her vitamin B12 injections at the beginning of each month. It has been a couple weeks since her last injection. She does them when she feels like she needs them.  She states that there is a chance that she has sleep apnea. It runs in her family.  She is having surgery on 11/25/2020.  The patient called clinic after her appointment today and stated that she had gone a month without B12 injections because her surgery had been rescheduled twice. That is why her B12 was low.   Past Medical History:  Diagnosis Date  . Anemia   . Anxiety   . Arthritis    hands, fingers, lower back  . Complication of anesthesia    loss control of bowels after block for hip replacement  . Depression    . Gastritis   . GERD (gastroesophageal reflux disease)   . Headache    hx of migraines in past  . Hypertension   . IDA (iron deficiency anemia) 03/17/2015  . IDA (iron deficiency anemia) 03/17/2015  . Neck pain    s/p fall approx 1 month ago.  Transient. "Random"    Past Surgical History:  Procedure Laterality Date  . ABDOMINAL HYSTERECTOMY  2008  . APPENDECTOMY    . BACK SURGERY  03/14/14   L4-5 laminectomy.  Dr Dutch Quint, [facility]  . CHOLECYSTECTOMY    . COLONOSCOPY    . COLONOSCOPY WITH PROPOFOL N/A 05/13/2016   Procedure: COLONOSCOPY WITH PROPOFOL;  Surgeon: [name], MD;  Location: [facility] ENDOSCOPY;  Service: Endoscopy;  Laterality: N/A;  . COLONOSCOPY WITH PROPOFOL N/A 05/20/2016   Procedure: COLONOSCOPY WITH PROPOFOL;  Surgeon: [name], MD;  Location: [facility] ENDOSCOPY;  Service: Endoscopy;  Laterality: N/A;  . ESOPHAGOGASTRODUODENOSCOPY (EGD) WITH PROPOFOL N/A 05/13/2016   Procedure: ESOPHAGOGASTRODUODENOSCOPY (EGD) WITH PROPOFOL;  Surgeon: [name], MD;  Location: [facility] ENDOSCOPY;  Service: Endoscopy;  Laterality: N/A;  . ESOPHAGOGASTRODUODENOSCOPY (EGD) WITH PROPOFOL N/A 10/12/2018   Procedure: ESOPHAGOGASTRODUODENOSCOPY (EGD) WITH PROPOFOL;  Surgeon: [name], MD;  Location: [facility] ENDOSCOPY;  Service: Endoscopy;  Laterality: N/A;  . GASTRIC BYPASS  2002  . JOINT REPLACEMENT Left 12/2013   hip replacement at [facility]  . PLANTAR FASCIA RELEASE Left 03/07/2016   Procedure:  1. Partial plantar fascial release with endoscopic procedure  2. Topaz fasciotomy percutaneously;  Surgeon: Recardo Evangelist, DPM;  Location: [facility];  Service: Podiatry;  Laterality: Left;  LMA WITH POPLITEAL TOPAZ  . TONSILLECTOMY      Family History  Problem Relation Age of Onset  . Diabetes Mother   . Stroke Mother   . Heart attack Father        died at age 33  . Heart disease Father   . Macular degeneration Maternal Grandmother   . Dementia Maternal Grandmother   .  Prostate cancer Maternal Grandfather   . Heart attack Paternal Grandmother   . Heart disease Paternal Grandmother   . Heart attack Paternal Grandfather   . Heart disease Paternal Grandfather   . Breast cancer Neg Hx     Social History:  reports that she has never smoked. She has never used smokeless tobacco. She reports previous alcohol use. She reports that she does not use drugs.  He denies any exposure to radiation or toxins.  The patient lives in [place].  The patient is alone today.  Allergies:  Allergies  Allergen Reactions  . Prednisone Palpitations    Will take if needed    Current Medications: Current Outpatient Medications  Medication Sig Dispense Refill  . aspirin 81 MG tablet Take 81 mg by mouth daily.    . busPIRone (BUSPAR) 5 MG tablet Take 5 mg by mouth 2 (two) times daily.   1  . Calcium Carbonate-Vitamin D 600-400 MG-UNIT tablet Take 2 tablets by mouth 2 (two) times daily.    . Coenzyme Q10 100 MG capsule Take 100 mg by mouth daily.    . cyanocobalamin (,VITAMIN B-12,) 1000 MCG/ML injection Inject 1 mL (1,000 mcg total) into the muscle every 30 (thirty) days. 6 mL 4  . escitalopram (LEXAPRO) 10 MG tablet Take 10 mg by mouth daily.    . fexofenadine (ALLEGRA) 180 MG tablet Take 180 mg by mouth daily as needed for allergies.    [name] gabapentin (NEURONTIN) 600 MG tablet Take 600 mg by mouth 2 (two) times daily.    [name] HYDROcodone-acetaminophen (NORCO/VICODIN) 5-325 MG tablet Take 1 tablet by mouth every 6 (six) hours as needed for severe pain.    . Ibuprofen-diphenhydrAMINE HCl (ADVIL PM) 200-25 MG CAPS Take 2-4 tablets by mouth at bedtime as needed.    [name] losartan (COZAAR) 50 MG tablet Take 50 mg by mouth daily.    . meloxicam (MOBIC) 15 MG tablet Take 15 mg by mouth daily.    . Multiple Vitamin (MULTIVITAMIN) tablet Take 1 tablet by mouth daily.    . Omega-3 Fatty Acids (FISH OIL) 1200 MG CAPS Take 1,200 mg by mouth daily.     . pantoprazole (PROTONIX) 40 MG tablet Take  40 mg by mouth 2 (two) times daily.   2  . sertraline (ZOLOFT) 100 MG tablet Take 1 tablet (100 mg total) by mouth daily. (Patient taking differently: Take 100 mg by mouth as needed.) 90 tablet 1  . sucralfate (CARAFATE) 1 g tablet Take 1 g by mouth 2 (two) times daily.     . traZODone (DESYREL) 100 MG tablet Take 100 mg by mouth at bedtime as needed for sleep.    . Vitamin D, Ergocalciferol, (DRISDOL) 50000 units CAPS capsule Take 1 capsule by mouth 2 (two) times a week.   3  . Calcium Carbonate-Vitamin D 600-200 MG-UNIT TABS Take by mouth.     No current facility-administered  medications for this visit.    Review of Systems  Constitutional: Positive for malaise/fatigue. Negative for chills, diaphoresis, fever and weight loss (up 2 lbs).  HENT: Negative for congestion, ear discharge, ear pain, hearing loss, nosebleeds, sinus pain, sore throat and tinnitus.   Eyes: Negative for blurred vision.  Respiratory: Negative for cough, hemoptysis, sputum production and shortness of breath.   Cardiovascular: Negative for chest pain, palpitations and leg swelling.  Gastrointestinal: Negative for abdominal pain, blood in stool, constipation, diarrhea, heartburn, melena, nausea and vomiting.  Genitourinary: Negative for dysuria, frequency, hematuria and urgency.  Musculoskeletal: Positive for back pain. Negative for joint pain, myalgias and neck pain.  Skin: Negative for itching and rash.  Neurological: Positive for sensory change. Negative for dizziness, tingling, weakness and headaches.  Endo/Heme/Allergies: Does not bruise/bleed easily.  Psychiatric/Behavioral: Negative for depression and memory loss. The patient is not nervous/anxious and does not have insomnia.   All other systems reviewed and are negative.  Performance Status (ECOG): 1  Vital Signs Blood pressure 128/86, pulse 84, temperature 98.6 F (37 C), temperature source Tympanic, resp. rate 18, weight 186 lb 2.9 oz (84.5 kg), SpO2 98  %.  Physical Exam Vitals and nursing note reviewed.  Constitutional:      General: She is not in acute distress.    Appearance: She is not diaphoretic.  HENT:     Head: Normocephalic and atraumatic.  Eyes:     General: No scleral icterus.    Conjunctiva/sclera: Conjunctivae normal.  Neurological:     Mental Status: She is alert.  Psychiatric:        Behavior: Behavior normal.        Thought Content: Thought content normal.        Judgment: Judgment normal.    No visits with results within 3 Day(s) from this visit.  Latest known visit with results is:  Office Visit on 10/30/2020  Component Date Value Ref Range Status  . Free T4 10/30/2020 0.82  0.61 - 1.12 ng/dL Final   Comment: (NOTE) Biotin ingestion may interfere with free T4 tests. If the results are inconsistent with the TSH level, previous test results, or the clinical presentation, then consider biotin interference. If needed, order repeat testing after stopping biotin. Performed at [facility], [address] [place], [place] [address]   . Retic Ct Pct 10/30/2020 1.9  0.4 - 3.1 % Final  . RBC. 10/30/2020 4.12  3.87 - 5.11 MIL/uL Final  . Retic Count, Absolute 10/30/2020 76.6  19.0 - 186.0 K/uL Final  . Immature Retic Fract 10/30/2020 6.6  2.3 - 15.9 % Final   Performed at [facility], [address], [place], [place] [address]  . TSH 10/30/2020 1.273  0.350 - 4.500 uIU/mL Final   Comment: Performed by a 3rd Generation assay with a functional sensitivity of <=0.01 uIU/mL. Performed at [facility], [address], [place], [place] [address]   . Folate 10/30/2020 15.7  >5.9 ng/mL Final   Performed at [facility], [address] [place] [place], [place] [address]  . Vitamin B-12 10/30/2020 288  180 - 914 pg/mL Final   Comment: (NOTE) This assay is not validated for testing neonatal or myeloproliferative syndrome specimens for Vitamin B12 levels. Performed at [facility], [address] [place], [place] [address]   . CRP 10/30/2020 <0.5  <1.0 mg/dL Final   Performed at [facility], [address] [place], [place] [address]  . Sed  Rate 10/30/2020 13  0 - 30 mm/hr Final   Performed at [facility], [address] [place], [place] [address]  . Iron 10/30/2020 92  28 - 170 ug/dL Final  . TIBC 47/82/956202/03/2021 300  250 - 450 ug/dL Final  . Saturation Ratios 10/30/2020 31  10.4 - 31.8 % Final  . UIBC 10/30/2020 208  ug/dL Final   Performed at [facility], [address] [place], [place] [address]  . Ferritin 10/30/2020 104  11 - 307 ng/mL Final   Performed at [facility], [address] [place], [place] [address]  . Sodium 10/30/2020 140  135 - 145 mmol/L Final  . Potassium 10/30/2020 4.4  3.5 - 5.1 mmol/L Final  . Chloride 10/30/2020 103  98 - 111 mmol/L Final  . CO2 10/30/2020 25  22 - 32 mmol/L Final  . Glucose, Bld 10/30/2020 107* 70 - 99 mg/dL Final   Glucose reference range applies only to samples taken after fasting for at least 8 hours.  . BUN 10/30/2020 19  6 - 20 mg/dL Final  . Creatinine, Ser 10/30/2020 0.58  0.44 - 1.00 mg/dL Final  . Calcium 96/29/528402/03/2021 8.9  8.9 - 10.3 mg/dL Final  . Total Protein 10/30/2020 7.0  6.5 - 8.1 g/dL Final  . Albumin 13/24/401002/03/2021 4.0  3.5 - 5.0 g/dL Final  . AST 27/25/366402/03/2021 22  15 - 41 U/L Final  . ALT 10/30/2020 22  0 - 44 U/L Final  . Alkaline Phosphatase 10/30/2020 68  38 - 126 U/L Final  . Total Bilirubin 10/30/2020 0.9  0.3 - 1.2 mg/dL Final  . GFR, Estimated 10/30/2020 >60  >60 mL/min Final   Comment: (NOTE) Calculated using the CKD-EPI Creatinine Equation (2021)   . Anion gap 10/30/2020 12  5 - 15 Final   Performed at [facility], [address] [place], [place] [address]  . WBC 10/30/2020 4.6  4.0 - 10.5 K/uL Final  . RBC 10/30/2020 4.12  3.87 - 5.11 MIL/uL Final  . Hemoglobin 10/30/2020 12.8  12.0 - 15.0 g/dL Final  . HCT 42/59/563802/03/2021 40.5  36.0 - 46.0 %  Final  . MCV 10/30/2020 98.3  80.0 - 100.0 fL Final  . MCH 10/30/2020 31.1  26.0 - 34.0 pg Final  . MCHC 10/30/2020 31.6  30.0 - 36.0 g/dL Final  . RDW 75/64/332902/03/2021 12.8  11.5 - 15.5 % Final  . Platelets 10/30/2020 184  150 - 400 K/uL Final  . nRBC 10/30/2020 0.0  0.0 - 0.2 % Final  . Neutrophils Relative % 10/30/2020 56  % Final  . Neutro Abs 10/30/2020 2.6  1.7 - 7.7 K/uL Final  . Lymphocytes Relative 10/30/2020 31  % Final  . Lymphs Abs 10/30/2020 1.4  0.7 - 4.0 K/uL Final  . Monocytes Relative 10/30/2020 9  % Final  . Monocytes Absolute 10/30/2020 0.4  0.1 - 1.0 K/uL Final  . Eosinophils Relative 10/30/2020 4  % Final  . Eosinophils Absolute 10/30/2020 0.2  0.0 - 0.5 K/uL Final  . Basophils Relative 10/30/2020 0  % Final  . Basophils Absolute 10/30/2020 0.0  0.0 - 0.1 K/uL Final  . Immature Granulocytes 10/30/2020 0  % Final  . Abs Immature Granulocytes 10/30/2020 0.01  0.00 - 0.07 K/uL Final   Performed at [facility], [address] [place], [place] [address]    Assessment:  [name] is a 60 y.o. female s/p gastric bypass (1998) and  subsequent iron deficiency and B12 deficiency.  She is intolerant to oral iron.  She was initially on sublingual B12 then switched to B12 injections at home  Work-up on 10/30/2020 revealed a hematocrit of 40.5, hemoglobin 12.8, platelets 184,000, and WBC 4,600 with an ANC of 2600. CMP was normal. Ferritin was 104 with an iron saturation of 31% and a TIBC of 300. Reticulocyte count was 1.9%. Sed rate was 13 and CRP < 0.5. TSH was 1.273 with a free T4 of 0.82. Vitamin B12 was 288 and folate 15.7.   She has received Feraheme: 06/07/2015, 06/16/2015, 12/27/2016, 10/24/2017 and 05/12/2020.  She received Venofer on 02/03/2015.    Ferritin has been followed: 40 on 03/17/2015, 31 on 04/18/2015, 30 on 06/05/2015, 142 on 08/25/2015, 107102/06/2016, 85 on 03/13/2016, 68 on 06/14/2016, 46 on 12/13/2016, 113 on 03/05/2017, 122 on 04/25/2017,  90 on 10/24/2017, 135 on 01/14/2018, 101 on 02/27/2018, 106 on 06/29/2018, 79 on 01/04/2019, 61 on 05/09/2020, and 104 on 10/30/2020.  Iron saturation has been followed and has ranged between 8% and 37%.  Last iron studies on 05/09/2020 revealed a iron saturation of 35% with a TIBC of 391.  Vitamin B12 has been followed: 974 on 08/25/2015, 526 on 09/27/2016, 2727 on 10/24/2017, 407106/03/2018, 1364 on 08/24/2019, 488 on 05/09/2020, and 288 on 10/30/2020.  Colonoscopy on 05/20/2016 revealed the sigmoid colon, descending colon and transverse colon were significantly redundant. There was some mild mucosal irritation at about 35-40 cm on withdrawal.  EGD on 10/12/2018 revealed a variable Z-line variable. There was LA Grade B erosive esophagitis. 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  There was bile gastritis. Roux-en-Y gastrojejunostomy with gastrojejunal anastomosis was characterized by an intact staple line and healthy appearing mucosa.    She has osteoporosis and receives Reclast.    Symptomatically, she feels "good."  All of her symptoms are stable. She denies any new concerns.  Plan: 1.   Review labs from 10/30/2020.   2.   Iron deficiency anemia  She is s/p gastric bypass surgery and has difficulty absorbing oral iron.     She is intolerant of oral iron.  Re-review plan for IV iron to maintain a ferritin > 30 and iron saturation >= to 18%.  Ferritin goal is 100.  Switch IV iron preparation to Venofer.  Patient in agreement. 3.   B12 deficiency  B12 was 288 on 10/30/2020.  Patient is s/p gastric bypass surgery and is B12 deficient.  Re-review plan for B12 monthly.    Patient missed her last month injection. 4.   RTC 01/04/2021 for labs (CBC with diff, ferritin,iron studies, B12). 5.   RTC the following week for MD assess, review of labs and +/- Venofer.  I discussed the assessment and treatment plan with the patient.  The patient was provided an opportunity to ask questions and all  were answered.  The patient agreed with the plan and demonstrated an understanding of the instructions.  The patient was advised to call back or seek an in person evaluation if the symptoms worsen or if the condition fails to improve as anticipated.  I provided 14 minutes of face-to-face time during this this encounter and > 50% was spent counseling as documented under my assessment and plan.  An additional 5-7 minutes were spent reviewing her chart (Epic and Care Everywhere) including notes, labs, and imaging studies.    Rosey Bath, MD, PhD 11/06/2020, 2:38 PM   I, Pietro Cassis, am acting as scribe for [employer]. Merlene Pulling, MD,  PhD.  I, Khalfani Weideman C. Mike Gip, MD, have reviewed the above documentation for accuracy and completeness, and I agree with the above.

## 2020-11-06 ENCOUNTER — Other Ambulatory Visit: Payer: Self-pay

## 2020-11-06 ENCOUNTER — Inpatient Hospital Stay: Payer: Medicare Other

## 2020-11-06 ENCOUNTER — Encounter: Payer: Self-pay | Admitting: Hematology and Oncology

## 2020-11-06 ENCOUNTER — Inpatient Hospital Stay (HOSPITAL_BASED_OUTPATIENT_CLINIC_OR_DEPARTMENT_OTHER): Payer: Medicare Other | Admitting: Hematology and Oncology

## 2020-11-06 VITALS — BP 128/86 | HR 84 | Temp 98.6°F | Resp 18 | Wt 186.2 lb

## 2020-11-06 DIAGNOSIS — K9589 Other complications of other bariatric procedure: Secondary | ICD-10-CM

## 2020-11-06 DIAGNOSIS — D508 Other iron deficiency anemias: Secondary | ICD-10-CM

## 2020-11-06 DIAGNOSIS — E538 Deficiency of other specified B group vitamins: Secondary | ICD-10-CM

## 2020-11-06 DIAGNOSIS — D509 Iron deficiency anemia, unspecified: Secondary | ICD-10-CM | POA: Diagnosis not present

## 2020-11-06 NOTE — Patient Instructions (Signed)
  B12 injection today (02/14) and 03/07, 03/28, and 04/18.

## 2020-11-06 NOTE — Progress Notes (Signed)
Patient here for oncology follow-up appointment, expresses concerns of occasional dirahea and numbness

## 2020-11-07 ENCOUNTER — Telehealth: Payer: Self-pay

## 2020-11-07 NOTE — Telephone Encounter (Signed)
-----   Message from Ardith Dark sent at 11/07/2020  8:44 AM EST ----- Regarding: B12 shot Patient called regarding appt yesterday - surgery is 11/22/20    Patient stated she has went a month without b12 shot due to surgery schedule being pushed out 2 times. States that is why B12 was low yesterday.

## 2020-11-20 ENCOUNTER — Encounter
Admission: RE | Admit: 2020-11-20 | Discharge: 2020-11-20 | Disposition: A | Discharge status: Home / Self Care | Payer: Medicare Other | Source: Ambulatory Visit | Attending: Neurosurgery | Admitting: Neurosurgery

## 2020-11-20 ENCOUNTER — Other Ambulatory Visit: Payer: Self-pay

## 2020-11-20 DIAGNOSIS — Z01812 Encounter for preprocedural laboratory examination: Secondary | ICD-10-CM | POA: Insufficient documentation

## 2020-11-20 DIAGNOSIS — Z20822 Contact with and (suspected) exposure to covid-19: Secondary | ICD-10-CM | POA: Insufficient documentation

## 2020-11-20 LAB — TYPE AND SCREEN
ABO/RH(D): B POS
Antibody Screen: NEGATIVE

## 2020-11-20 LAB — URINALYSIS, ROUTINE W REFLEX MICROSCOPIC
Bilirubin Urine: NEGATIVE
Glucose, UA: NEGATIVE mg/dL
Ketones, ur: NEGATIVE mg/dL
Leukocytes,Ua: NEGATIVE
Nitrite: NEGATIVE
Protein, ur: 30 mg/dL — AB
Specific Gravity, Urine: 1.029 (ref 1.005–1.030)
pH: 5 (ref 5.0–8.0)

## 2020-11-21 LAB — SARS CORONAVIRUS 2 (TAT 6-24 HRS): SARS Coronavirus 2: NEGATIVE

## 2020-11-22 ENCOUNTER — Inpatient Hospital Stay: Payer: Medicare Other

## 2020-11-22 ENCOUNTER — Other Ambulatory Visit: Payer: Self-pay

## 2020-11-22 ENCOUNTER — Encounter: Payer: Self-pay | Admitting: Neurosurgery

## 2020-11-22 ENCOUNTER — Encounter
Admission: RE | Disposition: A | Discharge status: Home / Self Care | Payer: Self-pay | Source: Home / Self Care | Attending: Neurosurgery

## 2020-11-22 ENCOUNTER — Inpatient Hospital Stay: Payer: Medicare Other | Admitting: Certified Registered"

## 2020-11-22 ENCOUNTER — Inpatient Hospital Stay
Admission: RE | Admit: 2020-11-22 | Discharge: 2020-11-23 | DRG: 460 | Disposition: A | Discharge status: Home / Self Care | Payer: Medicare Other | Attending: Neurosurgery | Admitting: Neurosurgery

## 2020-11-22 DIAGNOSIS — F32A Depression, unspecified: Secondary | ICD-10-CM | POA: Diagnosis present

## 2020-11-22 DIAGNOSIS — Z8249 Family history of ischemic heart disease and other diseases of the circulatory system: Secondary | ICD-10-CM

## 2020-11-22 DIAGNOSIS — K2100 Gastro-esophageal reflux disease with esophagitis, without bleeding: Secondary | ICD-10-CM | POA: Diagnosis present

## 2020-11-22 DIAGNOSIS — G43909 Migraine, unspecified, not intractable, without status migrainosus: Secondary | ICD-10-CM | POA: Diagnosis present

## 2020-11-22 DIAGNOSIS — G629 Polyneuropathy, unspecified: Secondary | ICD-10-CM | POA: Diagnosis present

## 2020-11-22 DIAGNOSIS — Z888 Allergy status to other drugs, medicaments and biological substances status: Secondary | ICD-10-CM | POA: Diagnosis not present

## 2020-11-22 DIAGNOSIS — Z96642 Presence of left artificial hip joint: Secondary | ICD-10-CM | POA: Diagnosis present

## 2020-11-22 DIAGNOSIS — D649 Anemia, unspecified: Secondary | ICD-10-CM | POA: Diagnosis present

## 2020-11-22 DIAGNOSIS — Z87891 Personal history of nicotine dependence: Secondary | ICD-10-CM

## 2020-11-22 DIAGNOSIS — M810 Age-related osteoporosis without current pathological fracture: Secondary | ICD-10-CM | POA: Diagnosis present

## 2020-11-22 DIAGNOSIS — Z20822 Contact with and (suspected) exposure to covid-19: Secondary | ICD-10-CM | POA: Diagnosis present

## 2020-11-22 DIAGNOSIS — Z8262 Family history of osteoporosis: Secondary | ICD-10-CM | POA: Diagnosis not present

## 2020-11-22 DIAGNOSIS — Z79899 Other long term (current) drug therapy: Secondary | ICD-10-CM

## 2020-11-22 DIAGNOSIS — I10 Essential (primary) hypertension: Secondary | ICD-10-CM | POA: Diagnosis present

## 2020-11-22 DIAGNOSIS — K2970 Gastritis, unspecified, without bleeding: Secondary | ICD-10-CM | POA: Diagnosis present

## 2020-11-22 DIAGNOSIS — Z825 Family history of asthma and other chronic lower respiratory diseases: Secondary | ICD-10-CM | POA: Diagnosis not present

## 2020-11-22 DIAGNOSIS — Z9884 Bariatric surgery status: Secondary | ICD-10-CM

## 2020-11-22 DIAGNOSIS — Z419 Encounter for procedure for purposes other than remedying health state, unspecified: Secondary | ICD-10-CM

## 2020-11-22 DIAGNOSIS — Z8719 Personal history of other diseases of the digestive system: Secondary | ICD-10-CM | POA: Diagnosis not present

## 2020-11-22 DIAGNOSIS — M4316 Spondylolisthesis, lumbar region: Secondary | ICD-10-CM | POA: Diagnosis present

## 2020-11-22 DIAGNOSIS — Z800 Family history of malignant neoplasm of digestive organs: Secondary | ICD-10-CM

## 2020-11-22 DIAGNOSIS — Z833 Family history of diabetes mellitus: Secondary | ICD-10-CM | POA: Diagnosis not present

## 2020-11-22 DIAGNOSIS — E669 Obesity, unspecified: Secondary | ICD-10-CM | POA: Diagnosis present

## 2020-11-22 DIAGNOSIS — Q438 Other specified congenital malformations of intestine: Secondary | ICD-10-CM

## 2020-11-22 DIAGNOSIS — M549 Dorsalgia, unspecified: Secondary | ICD-10-CM | POA: Diagnosis not present

## 2020-11-22 DIAGNOSIS — Z8261 Family history of arthritis: Secondary | ICD-10-CM | POA: Diagnosis not present

## 2020-11-22 DIAGNOSIS — Z818 Family history of other mental and behavioral disorders: Secondary | ICD-10-CM

## 2020-11-22 DIAGNOSIS — M1990 Unspecified osteoarthritis, unspecified site: Secondary | ICD-10-CM | POA: Diagnosis present

## 2020-11-22 DIAGNOSIS — M4326 Fusion of spine, lumbar region: Secondary | ICD-10-CM

## 2020-11-22 DIAGNOSIS — M47816 Spondylosis without myelopathy or radiculopathy, lumbar region: Secondary | ICD-10-CM | POA: Diagnosis present

## 2020-11-22 DIAGNOSIS — M5136 Other intervertebral disc degeneration, lumbar region: Secondary | ICD-10-CM

## 2020-11-22 HISTORY — PX: ANTERIOR LATERAL LUMBAR FUSION WITH PERCUTANEOUS SCREW 1 LEVEL: SHX5553

## 2020-11-22 SURGERY — ANTERIOR LATERAL LUMBAR FUSION WITH PERCUTANEOUS SCREW 1 LEVEL
Anesthesia: General

## 2020-11-22 MED ORDER — LIDOCAINE HCL (CARDIAC) PF 100 MG/5ML IV SOSY
PREFILLED_SYRINGE | INTRAVENOUS | Status: DC | PRN
  Administered 2020-11-22: 60 mg via INTRAVENOUS

## 2020-11-22 MED ORDER — TRAZODONE HCL 100 MG PO TABS
100.0000 mg | ORAL_TABLET | Freq: Every evening | ORAL | Status: DC | PRN
  Administered 2020-11-22: 100 mg via ORAL
  Filled 2020-11-22: qty 1

## 2020-11-22 MED ORDER — REMIFENTANIL HCL 1 MG IV SOLR
INTRAVENOUS | Status: DC | PRN
  Administered 2020-11-22: .2 ug/kg/min via INTRAVENOUS

## 2020-11-22 MED ORDER — PANTOPRAZOLE SODIUM 40 MG PO TBEC
40.0000 mg | DELAYED_RELEASE_TABLET | Freq: Two times a day (BID) | ORAL | Status: DC
  Administered 2020-11-22 – 2020-11-23 (×2): 40 mg via ORAL
  Filled 2020-11-22 (×2): qty 1

## 2020-11-22 MED ORDER — REMIFENTANIL HCL 1 MG IV SOLR
INTRAVENOUS | Status: AC
  Filled 2020-11-22: qty 1000

## 2020-11-22 MED ORDER — PROPOFOL 10 MG/ML IV BOLUS
INTRAVENOUS | Status: AC
  Filled 2020-11-22: qty 20

## 2020-11-22 MED ORDER — GABAPENTIN 600 MG PO TABS
600.0000 mg | ORAL_TABLET | Freq: Two times a day (BID) | ORAL | Status: DC
  Administered 2020-11-22 – 2020-11-23 (×2): 600 mg via ORAL
  Filled 2020-11-22 (×2): qty 1

## 2020-11-22 MED ORDER — SODIUM CHLORIDE 0.9% FLUSH
3.0000 mL | INTRAVENOUS | Status: DC | PRN
  Administered 2020-11-23: 3 mL via INTRAVENOUS

## 2020-11-22 MED ORDER — ONDANSETRON HCL 4 MG/2ML IJ SOLN
4.0000 mg | Freq: Four times a day (QID) | INTRAMUSCULAR | Status: DC | PRN
  Administered 2020-11-22: 4 mg via INTRAVENOUS
  Filled 2020-11-22: qty 2

## 2020-11-22 MED ORDER — SERTRALINE HCL 50 MG PO TABS
100.0000 mg | ORAL_TABLET | Freq: Every day | ORAL | Status: DC
  Administered 2020-11-22 – 2020-11-23 (×2): 100 mg via ORAL
  Filled 2020-11-22 (×2): qty 2

## 2020-11-22 MED ORDER — PROPOFOL 10 MG/ML IV BOLUS
INTRAVENOUS | Status: DC | PRN
  Administered 2020-11-22: 120 mg via INTRAVENOUS
  Administered 2020-11-22: 30 mg via INTRAVENOUS
  Administered 2020-11-22: 50 mg via INTRAVENOUS

## 2020-11-22 MED ORDER — CHLORHEXIDINE GLUCONATE 0.12 % MT SOLN
OROMUCOSAL | Status: AC
  Administered 2020-11-22: 15 mL via OROMUCOSAL
  Filled 2020-11-22: qty 15

## 2020-11-22 MED ORDER — KETOROLAC TROMETHAMINE 15 MG/ML IJ SOLN
INTRAMUSCULAR | Status: AC
  Administered 2020-11-22: 15 mg via INTRAVENOUS
  Filled 2020-11-22: qty 1

## 2020-11-22 MED ORDER — METHOCARBAMOL 1000 MG/10ML IJ SOLN
500.0000 mg | Freq: Four times a day (QID) | INTRAVENOUS | Status: DC | PRN
  Administered 2020-11-22: 500 mg via INTRAVENOUS
  Filled 2020-11-22: qty 5

## 2020-11-22 MED ORDER — PROPOFOL 500 MG/50ML IV EMUL
INTRAVENOUS | Status: AC
  Filled 2020-11-22: qty 50

## 2020-11-22 MED ORDER — ONDANSETRON HCL 4 MG/2ML IJ SOLN: 4.0000 mg | Freq: Once | INTRAMUSCULAR | Status: DC | PRN

## 2020-11-22 MED ORDER — LACTATED RINGERS IV SOLN: INTRAVENOUS | Status: DC

## 2020-11-22 MED ORDER — DEXAMETHASONE SODIUM PHOSPHATE 10 MG/ML IJ SOLN
INTRAMUSCULAR | Status: AC
  Filled 2020-11-22: qty 1

## 2020-11-22 MED ORDER — ACETAMINOPHEN 650 MG RE SUPP: 650.0000 mg | RECTAL | Status: DC | PRN

## 2020-11-22 MED ORDER — ACETAMINOPHEN 10 MG/ML IV SOLN
INTRAVENOUS | Status: AC
  Filled 2020-11-22: qty 100

## 2020-11-22 MED ORDER — PHENYLEPHRINE HCL (PRESSORS) 10 MG/ML IV SOLN
INTRAVENOUS | Status: DC | PRN
  Administered 2020-11-22 (×4): 100 ug via INTRAVENOUS

## 2020-11-22 MED ORDER — OXYCODONE HCL 5 MG PO TABS
5.0000 mg | ORAL_TABLET | Freq: Once | ORAL | Status: AC | PRN
  Administered 2020-11-22: 5 mg via ORAL

## 2020-11-22 MED ORDER — FENTANYL CITRATE (PF) 100 MCG/2ML IJ SOLN
INTRAMUSCULAR | Status: AC
  Administered 2020-11-22: 50 ug via INTRAVENOUS
  Filled 2020-11-22: qty 2

## 2020-11-22 MED ORDER — ORAL CARE MOUTH RINSE: 15.0000 mL | Freq: Once | OROMUCOSAL | Status: AC

## 2020-11-22 MED ORDER — FENTANYL CITRATE (PF) 100 MCG/2ML IJ SOLN
INTRAMUSCULAR | Status: DC | PRN
  Administered 2020-11-22 (×2): 50 ug via INTRAVENOUS

## 2020-11-22 MED ORDER — FENTANYL CITRATE (PF) 100 MCG/2ML IJ SOLN
INTRAMUSCULAR | Status: AC
  Administered 2020-11-22: 25 ug via INTRAVENOUS
  Filled 2020-11-22: qty 2

## 2020-11-22 MED ORDER — CEFAZOLIN SODIUM-DEXTROSE 2-4 GM/100ML-% IV SOLN
INTRAVENOUS | Status: AC
  Filled 2020-11-22: qty 100

## 2020-11-22 MED ORDER — SODIUM CHLORIDE 0.9 % IV SOLN: INTRAVENOUS | Status: DC | PRN

## 2020-11-22 MED ORDER — SODIUM CHLORIDE 0.9 % IV SOLN
INTRAVENOUS | Status: DC | PRN
  Administered 2020-11-22: 25 ug/min via INTRAVENOUS

## 2020-11-22 MED ORDER — FENTANYL CITRATE (PF) 100 MCG/2ML IJ SOLN
25.0000 ug | INTRAMUSCULAR | Status: AC | PRN
Start: 2020-11-22 — End: 2020-11-22
  Administered 2020-11-22 (×4): 25 ug via INTRAVENOUS

## 2020-11-22 MED ORDER — SUCCINYLCHOLINE CHLORIDE 200 MG/10ML IV SOSY
PREFILLED_SYRINGE | INTRAVENOUS | Status: AC
  Filled 2020-11-22: qty 10

## 2020-11-22 MED ORDER — POLYETHYLENE GLYCOL 3350 17 G PO PACK: 17.0000 g | PACK | Freq: Every day | ORAL | Status: DC | PRN

## 2020-11-22 MED ORDER — SUCRALFATE 1 G PO TABS
1.0000 g | ORAL_TABLET | Freq: Two times a day (BID) | ORAL | Status: DC
  Administered 2020-11-22 – 2020-11-23 (×2): 1 g via ORAL
  Filled 2020-11-22 (×2): qty 1

## 2020-11-22 MED ORDER — PROPOFOL 500 MG/50ML IV EMUL
INTRAVENOUS | Status: DC | PRN
  Administered 2020-11-22: 150 ug/kg/min via INTRAVENOUS

## 2020-11-22 MED ORDER — CEFAZOLIN SODIUM-DEXTROSE 2-4 GM/100ML-% IV SOLN
2.0000 g | INTRAVENOUS | Status: AC
  Administered 2020-11-22: 2 g via INTRAVENOUS

## 2020-11-22 MED ORDER — METHOCARBAMOL 500 MG PO TABS: 500.0000 mg | ORAL_TABLET | Freq: Four times a day (QID) | ORAL | Status: DC | PRN

## 2020-11-22 MED ORDER — ONDANSETRON HCL 4 MG PO TABS: 4.0000 mg | ORAL_TABLET | Freq: Four times a day (QID) | ORAL | Status: DC | PRN

## 2020-11-22 MED ORDER — BUPIVACAINE-EPINEPHRINE (PF) 0.5% -1:200000 IJ SOLN
INTRAMUSCULAR | Status: DC | PRN
  Administered 2020-11-22: 8 mL

## 2020-11-22 MED ORDER — SUCCINYLCHOLINE CHLORIDE 20 MG/ML IJ SOLN
INTRAMUSCULAR | Status: DC | PRN
  Administered 2020-11-22: 100 mg via INTRAVENOUS

## 2020-11-22 MED ORDER — ONDANSETRON HCL 4 MG/2ML IJ SOLN
INTRAMUSCULAR | Status: AC
  Filled 2020-11-22: qty 2

## 2020-11-22 MED ORDER — ONDANSETRON HCL 4 MG/2ML IJ SOLN
INTRAMUSCULAR | Status: DC | PRN
  Administered 2020-11-22: 4 mg via INTRAVENOUS

## 2020-11-22 MED ORDER — ENOXAPARIN SODIUM 40 MG/0.4ML ~~LOC~~ SOLN
40.0000 mg | SUBCUTANEOUS | Status: DC
  Administered 2020-11-23: 40 mg via SUBCUTANEOUS
  Filled 2020-11-22: qty 0.4

## 2020-11-22 MED ORDER — MIDAZOLAM HCL 2 MG/2ML IJ SOLN
INTRAMUSCULAR | Status: AC
  Filled 2020-11-22: qty 2

## 2020-11-22 MED ORDER — LIDOCAINE HCL (PF) 2 % IJ SOLN
INTRAMUSCULAR | Status: AC
  Filled 2020-11-22: qty 5

## 2020-11-22 MED ORDER — KETOROLAC TROMETHAMINE 15 MG/ML IJ SOLN
15.0000 mg | Freq: Four times a day (QID) | INTRAMUSCULAR | Status: AC
  Administered 2020-11-22 – 2020-11-23 (×3): 15 mg via INTRAVENOUS
  Filled 2020-11-22 (×2): qty 1

## 2020-11-22 MED ORDER — ACETAMINOPHEN 500 MG PO TABS
1000.0000 mg | ORAL_TABLET | Freq: Four times a day (QID) | ORAL | Status: AC
  Administered 2020-11-22 – 2020-11-23 (×3): 1000 mg via ORAL
  Administered 2020-11-23: 500 mg via ORAL
  Filled 2020-11-22 (×4): qty 2

## 2020-11-22 MED ORDER — LOSARTAN POTASSIUM 50 MG PO TABS
50.0000 mg | ORAL_TABLET | Freq: Every day | ORAL | Status: DC
  Administered 2020-11-22 – 2020-11-23 (×2): 50 mg via ORAL
  Filled 2020-11-22 (×2): qty 1

## 2020-11-22 MED ORDER — DEXAMETHASONE SODIUM PHOSPHATE 10 MG/ML IJ SOLN
INTRAMUSCULAR | Status: DC | PRN
  Administered 2020-11-22: 10 mg via INTRAVENOUS

## 2020-11-22 MED ORDER — ACETAMINOPHEN 325 MG PO TABS: 650.0000 mg | ORAL_TABLET | ORAL | Status: DC | PRN

## 2020-11-22 MED ORDER — BISACODYL 5 MG PO TBEC: 5.0000 mg | DELAYED_RELEASE_TABLET | Freq: Every day | ORAL | Status: DC | PRN

## 2020-11-22 MED ORDER — OXYCODONE HCL 5 MG PO TABS
ORAL_TABLET | ORAL | Status: AC
  Filled 2020-11-22: qty 1

## 2020-11-22 MED ORDER — THROMBIN 5000 UNITS EX KIT
PACK | CUTANEOUS | Status: DC | PRN
  Administered 2020-11-22: 5000 [IU] via TOPICAL

## 2020-11-22 MED ORDER — SODIUM CHLORIDE 0.9% FLUSH
3.0000 mL | Freq: Two times a day (BID) | INTRAVENOUS | Status: DC
  Administered 2020-11-22 – 2020-11-23 (×3): 3 mL via INTRAVENOUS

## 2020-11-22 MED ORDER — VANCOMYCIN HCL IN DEXTROSE 1-5 GM/200ML-% IV SOLN: 1000.0000 mg | INTRAVENOUS | Status: AC

## 2020-11-22 MED ORDER — MIDAZOLAM HCL 2 MG/2ML IJ SOLN
INTRAMUSCULAR | Status: DC | PRN
  Administered 2020-11-22: 2 mg via INTRAVENOUS

## 2020-11-22 MED ORDER — HYDROCODONE-ACETAMINOPHEN 5-325 MG PO TABS
1.0000 | ORAL_TABLET | ORAL | Status: DC | PRN
  Administered 2020-11-22: 1 via ORAL
  Filled 2020-11-22: qty 1

## 2020-11-22 MED ORDER — POTASSIUM CHLORIDE IN NACL 20-0.9 MEQ/L-% IV SOLN
INTRAVENOUS | Status: DC
  Filled 2020-11-22 (×4): qty 1000

## 2020-11-22 MED ORDER — HYDROMORPHONE HCL 1 MG/ML IJ SOLN: 0.5000 mg | INTRAMUSCULAR | Status: DC | PRN

## 2020-11-22 MED ORDER — VANCOMYCIN HCL IN DEXTROSE 1-5 GM/200ML-% IV SOLN
INTRAVENOUS | Status: AC
  Administered 2020-11-22: 1000 mg via INTRAVENOUS
  Filled 2020-11-22: qty 200

## 2020-11-22 MED ORDER — GLYCOPYRROLATE 0.2 MG/ML IJ SOLN
INTRAMUSCULAR | Status: DC | PRN
  Administered 2020-11-22: .2 mg via INTRAVENOUS

## 2020-11-22 MED ORDER — MENTHOL 3 MG MT LOZG
1.0000 | LOZENGE | OROMUCOSAL | Status: DC | PRN
  Filled 2020-11-22: qty 9

## 2020-11-22 MED ORDER — DOCUSATE SODIUM 100 MG PO CAPS
100.0000 mg | ORAL_CAPSULE | Freq: Two times a day (BID) | ORAL | Status: DC
  Administered 2020-11-22 – 2020-11-23 (×2): 100 mg via ORAL
  Filled 2020-11-22 (×2): qty 1

## 2020-11-22 MED ORDER — ACETAMINOPHEN 10 MG/ML IV SOLN
INTRAVENOUS | Status: DC | PRN
  Administered 2020-11-22: 1000 mg via INTRAVENOUS

## 2020-11-22 MED ORDER — SODIUM CHLORIDE 0.9 % IV SOLN: 250.0000 mL | INTRAVENOUS | Status: DC

## 2020-11-22 MED ORDER — PHENOL 1.4 % MT LIQD
1.0000 | OROMUCOSAL | Status: DC | PRN
  Filled 2020-11-22: qty 177

## 2020-11-22 MED ORDER — FENTANYL CITRATE (PF) 100 MCG/2ML IJ SOLN
25.0000 ug | INTRAMUSCULAR | Status: DC | PRN
  Administered 2020-11-22: 25 ug via INTRAVENOUS

## 2020-11-22 MED ORDER — OXYCODONE HCL 5 MG/5ML PO SOLN: 5.0000 mg | Freq: Once | ORAL | Status: AC | PRN

## 2020-11-22 MED ORDER — FENTANYL CITRATE (PF) 100 MCG/2ML IJ SOLN
INTRAMUSCULAR | Status: AC
  Filled 2020-11-22: qty 2

## 2020-11-22 MED ORDER — CHLORHEXIDINE GLUCONATE 0.12 % MT SOLN: 15.0000 mL | Freq: Once | OROMUCOSAL | Status: AC

## 2020-11-22 SURGICAL SUPPLY — 92 items
APPLIER CLIP 11 MED OPEN (CLIP)
BUR NEURO DRILL SOFT 3.0X3.8M (BURR) IMPLANT
CHLORAPREP W/TINT 26 (MISCELLANEOUS) ×2 IMPLANT
CLIP APPLIE 11 MED OPEN (CLIP) IMPLANT
CORD BIP STRL DISP 12FT (MISCELLANEOUS) ×2 IMPLANT
CORD LIGHT LATERIAL X LIFT (MISCELLANEOUS) ×2 IMPLANT
COUNTER NEEDLE 20/40 LG (NEEDLE) ×2 IMPLANT
COVER BACK TABLE REUSABLE LG (DRAPES) ×2 IMPLANT
COVER WAND RF STERILE (DRAPES) ×2 IMPLANT
CUP MEDICINE 2OZ PLAST GRAD ST (MISCELLANEOUS) ×2 IMPLANT
DERMABOND ADVANCED (GAUZE/BANDAGES/DRESSINGS) ×1
DERMABOND ADVANCED .7 DNX12 (GAUZE/BANDAGES/DRESSINGS) ×1 IMPLANT
DISSECTOR STICK (MISCELLANEOUS) ×2 IMPLANT
DRAPE C ARM PK CFD 31 SPINE (DRAPES) ×2 IMPLANT
DRAPE C-ARMOR (DRAPES) ×2 IMPLANT
DRAPE INCISE IOBAN 66X45 STRL (DRAPES) ×2 IMPLANT
DRAPE INCISE IOBAN 66X60 STRL (DRAPES) ×2 IMPLANT
DRAPE LAPAROTOMY 100X77 ABD (DRAPES) ×2 IMPLANT
DRAPE SURG 17X11 SM STRL (DRAPES) ×16 IMPLANT
DRESSING SURGICEL FIBRLLR 1X2 (HEMOSTASIS) IMPLANT
DRSG OPSITE POSTOP 4X6 (GAUZE/BANDAGES/DRESSINGS) IMPLANT
DRSG SURGICEL FIBRILLAR 1X2 (HEMOSTASIS)
DRSG TEGADERM 2-3/8X2-3/4 SM (GAUZE/BANDAGES/DRESSINGS) IMPLANT
DRSG TEGADERM 4X4.75 (GAUZE/BANDAGES/DRESSINGS) IMPLANT
DRSG TEGADERM 6X8 (GAUZE/BANDAGES/DRESSINGS) IMPLANT
DRSG TELFA 3X8 NADH (GAUZE/BANDAGES/DRESSINGS) IMPLANT
DRSG TELFA 4X3 1S NADH ST (GAUZE/BANDAGES/DRESSINGS) IMPLANT
ELECT CAUTERY BLADE TIP 2.5 (TIP) ×2
ELECT EZSTD 165MM 6.5IN (MISCELLANEOUS) ×2
ELECT REM PT RETURN 9FT ADLT (ELECTROSURGICAL) ×2
ELECTRODE CAUTERY BLDE TIP 2.5 (TIP) ×1 IMPLANT
ELECTRODE EZSTD 165MM 6.5IN (MISCELLANEOUS) ×1 IMPLANT
ELECTRODE REM PT RTRN 9FT ADLT (ELECTROSURGICAL) ×1 IMPLANT
FEE INTRAOP MONITOR IMPULS NCS (MISCELLANEOUS) ×1 IMPLANT
FORCEPS BPLR BAYO 10IN 1.0TIP (INSTRUMENTS) ×2 IMPLANT
GLOVE SURG SYN 7.0 (GLOVE) ×8 IMPLANT
GLOVE SURG SYN 8.5  E (GLOVE) ×6
GLOVE SURG SYN 8.5 E (GLOVE) ×6 IMPLANT
GLOVE SURG UNDER POLY LF SZ7 (GLOVE) IMPLANT
GOWN SRG XL LVL 3 NONREINFORCE (GOWNS) ×2 IMPLANT
GOWN STRL NON-REIN TWL XL LVL3 (GOWNS) ×2
GOWN STRL REUS W/ TWL XL LVL3 (GOWN DISPOSABLE) ×1 IMPLANT
GOWN STRL REUS W/TWL XL LVL3 (GOWN DISPOSABLE) ×1
GRADUATE 1200CC STRL 31836 (MISCELLANEOUS) ×2 IMPLANT
INTRAOP MONITOR FEE IMPULS NCS (MISCELLANEOUS) ×1
INTRAOP MONITOR FEE IMPULSE (MISCELLANEOUS) ×1
KIT DISP MARS 3V (KITS) ×2 IMPLANT
KIT PEDICLE ACCESS (KITS) ×2 IMPLANT
KIT SPINAL PRONEVIEW (KITS) IMPLANT
KIT TURNOVER KIT A (KITS) ×2 IMPLANT
LOOP RED MAXI  1X406MM (MISCELLANEOUS)
LOOP VESSEL MAXI 1X406 RED (MISCELLANEOUS) IMPLANT
LOOP VESSEL MINI 0.8X406 BLUE (MISCELLANEOUS) IMPLANT
LOOPS BLUE MINI 0.8X406MM (MISCELLANEOUS)
MANIFOLD NEPTUNE II (INSTRUMENTS) ×2 IMPLANT
MARKER SKIN DUAL TIP RULER LAB (MISCELLANEOUS) ×4 IMPLANT
NDL SAFETY ECLIPSE 18X1.5 (NEEDLE) ×1 IMPLANT
NEEDLE HYPO 18GX1.5 SHARP (NEEDLE) ×1
NEEDLE HYPO 22GX1.5 SAFETY (NEEDLE) ×2 IMPLANT
PACK LAMINECTOMY NEURO (CUSTOM PROCEDURE TRAY) ×2 IMPLANT
PACK UNIVERSAL (MISCELLANEOUS) IMPLANT
PAD ARMBOARD 7.5X6 YLW CONV (MISCELLANEOUS) ×6 IMPLANT
PENCIL ELECTRO HAND CTR (MISCELLANEOUS) ×2 IMPLANT
PLATE PLYMOUTH 4H 15 (Plate) ×2 IMPLANT
PLEDGET CV PTFE 7X3 (MISCELLANEOUS) IMPLANT
PUTTY DBX 5CC (Putty) ×2 IMPLANT
SCREW ANGLE VAR 5.5X42 (Screw) ×4 IMPLANT
SCREW ANGLE VAR 5.5X48 (Screw) ×4 IMPLANT
SPACER SPINE RISE-L 18X45 7-14 (Spacer) ×2 IMPLANT
SPOGE SURGIFLO 8M (HEMOSTASIS) ×1
SPONGE GAUZE 2X2 8PLY STRL LF (GAUZE/BANDAGES/DRESSINGS) IMPLANT
SPONGE LAP 18X18 RF (DISPOSABLE) IMPLANT
SPONGE SURGIFLO 8M (HEMOSTASIS) ×1 IMPLANT
STAPLER SKIN PROX 35W (STAPLE) ×4 IMPLANT
SUT DVC VLOC 3-0 CL 6 P-12 (SUTURE) ×2 IMPLANT
SUT ETHILON 3-0 FS-10 30 BLK (SUTURE)
SUT PROLENE 5 0 RB 1 DA (SUTURE) IMPLANT
SUT SILK 2 0 (SUTURE)
SUT SILK 2-0 30XBRD TIE 12 (SUTURE) IMPLANT
SUT SILK 3 0 REEL (SUTURE) IMPLANT
SUT VIC AB 0 CT1 27 (SUTURE) ×1
SUT VIC AB 0 CT1 27XCR 8 STRN (SUTURE) ×1 IMPLANT
SUT VIC AB 2-0 CT1 (SUTURE) ×2 IMPLANT
SUT VIC AB 2-0 CT1 18 (SUTURE) ×6 IMPLANT
SUT VIC AB 3-0 SH 27 (SUTURE) ×2
SUT VIC AB 3-0 SH 27X BRD (SUTURE) ×2 IMPLANT
SUTURE EHLN 3-0 FS-10 30 BLK (SUTURE) IMPLANT
SYR 10ML LL (SYRINGE) ×2 IMPLANT
SYR 30ML LL (SYRINGE) ×4 IMPLANT
TOWEL OR 17X26 4PK STRL BLUE (TOWEL DISPOSABLE) ×8 IMPLANT
TRAY FOLEY MTR SLVR 16FR STAT (SET/KITS/TRAYS/PACK) IMPLANT
TUBING CONNECTING 10 (TUBING) ×2 IMPLANT

## 2020-11-22 NOTE — Anesthesia Procedure Notes (Signed)
Procedure Name: Intubation Date/Time: 11/22/2020 8:49 AM Performed by: Henrietta Hoover, CRNA Pre-anesthesia Checklist: Patient identified, Patient being monitored, Timeout performed, Emergency Drugs available and Suction available Patient Re-evaluated:Patient Re-evaluated prior to induction Oxygen Delivery Method: Circle system utilized Preoxygenation: Pre-oxygenation with 100% oxygen Induction Type: IV induction Ventilation: Mask ventilation without difficulty Laryngoscope Size: 3 and McGraph Grade View: Grade I Tube type: Oral Tube size: 7.0 mm Number of attempts: 1 Airway Equipment and Method: Stylet Placement Confirmation: ETT inserted through vocal cords under direct vision,  positive ETCO2 and breath sounds checked- equal and bilateral Secured at: 22 cm Tube secured with: Tape Dental Injury: Teeth and Oropharynx as per pre-operative assessment

## 2020-11-22 NOTE — Op Note (Signed)
Indications: Ms. Pagliaro is a 60 yo female who presented with adjacent segment disease with spondylolisthesis.  She failed conservative management and elected for surgical intervention.  Findings: correction of spondylolisthesis.  Preoperative Diagnosis: adjacent segment disease with spondylolisthesis Postoperative Diagnosis: same   EBL: 25 ml IVF: 1000 ml Drains: none Disposition: Extubated and Stable to PACU Complications: none  No foley catheter was placed.   Preoperative Note:   Risks of surgery discussed include: infection, bleeding, stroke, coma, death, paralysis, CSF leak, nerve/spinal cord injury, numbness, tingling, weakness, complex regional pain syndrome, recurrent stenosis and/or disc herniation, vascular injury, development of instability, neck/back pain, need for further surgery, persistent symptoms, development of deformity, and the risks of anesthesia. The patient understood these risks and agreed to proceed.  NAME OF ANTERIOR PROCEDURE:               1. Anterior lumbar interbody fusion via a left lateral retroperitoneal approach at L3/4 2. Placement of a Lordotic Globus Rise-L  interbody cage, filled with Demineralized Bone Matrix  3. Anterior instrumentation using GlobusLateral Instrumentation   PROCEDURE:  Patient was brought to the operating room, intubated, turned to the lateral position.  All pressure points were checked and double-checked.  The patient was prepped and draped in the standard fashion. Prior to prepping, fluoroscopy was brought in and the patient was positioned with a large bump under the contralateral side between the iliac crest and rib cage, allowing the area between the iliac crest and the lateral aspect of the rib cage to open and increase the ability to reach inferiorly, to facilitate entry into the disc space.  The incision was marked upon the skin both the location of the disc space as well as the superior most aspect of the iliac crest.   Based on the identification of the disc space an incision was prepared, marked upon the skin and eventually was used for our lateral incision.  The fluoroscopy was turned into a cross table A/P image in order to confirm that the patient's spine remained in a perpendicular trajectory to the floor without rotation.  Once confirming that all the pressure points were checked and double-checked and the patient remained in sturdy position strapped down in this slightly jack-knifed lateral position, the patient was prepped and draped in standard fashion.  The skin was injected with local anesthetic, then incised until the abdominal wall fascia was noted.  I bluntly dissected posteriorly until we were able to identify the posterior musculature near petit's triangle.  At this point, using primarily blunt dissection with our finger aided with a metzenbaum scissor, were able to enter the retroperitoneal cavity.  The retroperitoneal potential space was opened further until palpating out the psoas muscle, the medial aspect of the iliac crest, the medial aspect of the last rib and continued to define the retroperitoneal space with blunt dissection in order to facilitate safe placement of our dilators.    While protecting by dissecting directly onto a finger in the retroperitoneum, the retroperitoneal space was entered safely from the lateral incision and the initial dilator placed onto the muscle belly of the psoas.  While directly stimulating the dilator and after radiographically confirming our location relative to the disc space, I placed the dilator through the psoas.  The dilators were stimulated to ensure remaining safely away from any of the lumbar plexus nerves; the dilators were repositioned until no pathologic stimulation was appreciated.  Once I had confirmed the location of our initial dilator radiographically, a K-wire  secured the dilator into the L3/4 disc space and confirmed position under A/P and lateral  fluoroscopy.  At this point, I dilated up with direct stimulation to confirm lack of pathologic stimulation.  Once all the dilators were in position, I placed in the retractor and secured it onto the table, locked into position and confirmed under A/P and lateral fluoroscopy to confirm our approach angle to the disc space as well as location relative to the disc space.  I then placed the muscle stimulator in through the working channel down to the vertebral body, stimulating the entire lateral surface of the vertebral body and any of the visualized psoas muscle that was adjacent to the retractor, confirming again the safe passage to the psoas before we began performing the discectomy.  At this point, we began our discectomy at L3/4.  The disc was incised laterally throughout the extent of our exposure. Using a combination of pituitary rongeurs, Kerrison rongeurs, rasps, curettes of various sorts, we were able to begin to clean out the disc space.  Once we had cleaned out the majority of the disc space, we then cut the lateral annulus with a cob, breaking the lateral annual attachments on the contralateral side by subtly working the cob through the annulus while using flouroscopy.  Care was taken not to extend further than required after cutting the annular attachments.  After this had been performed, we prepared the endplates for placement of our graft, sized a graft to the disc space by serially dilating up in trial sizes until we confirmed that our graft would be well positioned, allowing distraction while maintaining good grip.  This was confirmed under A/P and lateral fluoroscopy in order to ensure its placement as an eventual trial for placement of our final graft.  We irrigated with bacteriostatic saline.  Once confirmed placement, the Globus implant filled with allograft was impacted into position at L3/4 and expanded.   Through a combination of intradiscal distraction and anterior releasing, we were  able to correct the anterior deformity during disc preparation and placement of the graft.   At this point we placed in lateral instrumentation.  Through the same working channel, a 15 mm Globus PlymouthDecade lateral plate was positioned over the disc space. Using an awl, each of the screw positions was started and cannulated to a depth of 20 mm. A 5.5x42 mm screw was placed in the anterior position at L3. A 5.5x48 mm screw was placed in the anterior position at L4. A 5.5x42 mm screw was placed in the posterior position at L3. A 5.5x48 mm screw was placed in the posterior position at L4. The placement of the plate was noted to be in appropriate position on lateral and AP flouroscopy.       At this point, final radiographs were performed, and we began closure.  The wound was closed using 0 Vicryl interrupted suture in the fascia and 2-0 Vicryl inverted suture were placed in the subcutaneous tissue and dermis. 3-0 monocryl was used for final closure. Dermabond was used to close the skin.         Needle, lap and all counts were correct at the end of the case.    There was no pathologic change in the neuromonitoring during the procedure.   Anabel Halon PA assisted in the entire procedure.  Venetia Night MD Neurosurgery

## 2020-11-22 NOTE — Anesthesia Preprocedure Evaluation (Addendum)
Anesthesia Evaluation  Patient identified by MRN, date of birth, ID band Patient awake    Reviewed: Allergy & Precautions, H&P , NPO status , Patient's Chart, lab work & pertinent test results  History of Anesthesia Complications Negative for: history of anesthetic complications  Airway Mallampati: II  TM Distance: >3 FB     Dental  (+) Teeth Intact   Pulmonary sleep apnea (suspected, awaiting sleep study.  "wakes up gasping" occasioally) , neg COPD,    breath sounds clear to auscultation       Cardiovascular hypertension, (-) angina(-) Past MI and (-) Cardiac Stents (-) dysrhythmias  Rhythm:regular Rate:Normal     Neuro/Psych  Headaches, PSYCHIATRIC DISORDERS Anxiety Depression    GI/Hepatic Neg liver ROS, GERD  ,  Endo/Other  negative endocrine ROS  Renal/GU      Musculoskeletal   Abdominal   Peds  Hematology  (+) Blood dyscrasia, anemia ,   Anesthesia Other Findings Past Medical History: No date: Anemia No date: Anxiety No date: Arthritis     Comment:  hands, fingers, lower back No date: Complication of anesthesia     Comment:  loss control of bowels after block for hip replacement No date: Depression No date: Gastritis No date: GERD (gastroesophageal reflux disease) No date: Headache     Comment:  hx of migraines in past No date: Hypertension 03/17/2015: IDA (iron deficiency anemia) 03/17/2015: IDA (iron deficiency anemia) No date: Neck pain     Comment:  s/p fall approx 1 month ago.  Transient. "Random"  Past Surgical History: 2008: ABDOMINAL HYSTERECTOMY No date: APPENDECTOMY 03/14/14: BACK SURGERY     Comment:  L4-5 laminectomy.  Dr Dutch Quint, [facility] No date: CHOLECYSTECTOMY No date: COLONOSCOPY 05/13/2016: COLONOSCOPY WITH PROPOFOL; N/A     Comment:  Procedure: COLONOSCOPY WITH PROPOFOL;  Surgeon: [name], MD;  Location: [facility] ENDOSCOPY;  Service:               Endoscopy;   Laterality: N/A; 05/20/2016: COLONOSCOPY WITH PROPOFOL; N/A     Comment:  Procedure: COLONOSCOPY WITH PROPOFOL;  Surgeon: [name], MD;  Location: [facility] ENDOSCOPY;  Service:               Endoscopy;  Laterality: N/A; 05/13/2016: ESOPHAGOGASTRODUODENOSCOPY (EGD) WITH PROPOFOL; N/A     Comment:  Procedure: ESOPHAGOGASTRODUODENOSCOPY (EGD) WITH               PROPOFOL;  Surgeon: [name], MD;  Location:               [facility] ENDOSCOPY;  Service: Endoscopy;  Laterality: N/A; 10/12/2018: ESOPHAGOGASTRODUODENOSCOPY (EGD) WITH PROPOFOL; N/A     Comment:  Procedure: ESOPHAGOGASTRODUODENOSCOPY (EGD) WITH               PROPOFOL;  Surgeon: [name], MD;  Location:               [facility] ENDOSCOPY;  Service: Endoscopy;  Laterality: N/A; 2002: GASTRIC BYPASS 12/2013: JOINT REPLACEMENT; Left     Comment:  hip replacement at [facility] 03/07/2016: PLANTAR FASCIA RELEASE; Left     Comment:  Procedure: 1. Partial plantar fascial release with               endoscopic procedure  2. Topaz fasciotomy  percutaneously;  Surgeon: Recardo Evangelist, DPM;                Location: [facility];  Service: Podiatry;                Laterality: Left;  LMA WITH POPLITEAL TOPAZ No date: TONSILLECTOMY  BMI    Body Mass Index: 32.98 kg/m      Reproductive/Obstetrics negative OB ROS                            Anesthesia Physical Anesthesia Plan  ASA: II  Anesthesia Plan: General ETT   Post-op Pain Management:    Induction:   PONV Risk Score and Plan: Ondansetron, Dexamethasone, Midazolam and Treatment may vary due to age or medical condition  Airway Management Planned: Simple Face Mask  Additional Equipment:   Intra-op Plan:   Post-operative Plan:   Informed Consent: I have reviewed the patients History and Physical, chart, labs and discussed the procedure including the risks, benefits and alternatives for the proposed anesthesia with  the patient or authorized representative who has indicated his/her understanding and acceptance.     Dental Advisory Given  Plan Discussed with: Anesthesiologist, CRNA and Surgeon  Anesthesia Plan Comments:        Anesthesia Quick Evaluation

## 2020-11-22 NOTE — H&P (Signed)
History of Present Illness: 11/22/2020 [name] presents today with continued pain.  She will move forward with surgery.   09/01/2020  [name] presents today for increased left buttock and leg pain. She reports that she was hanging Christmas ornaments and felt a "pop". Since that time she has had pain that has gone down from her lower back down to her foot, posteriorly, along the left leg. She denies any bowel or bladder dysfunction, saddle anesthesia, lower extremity weakness. She has an appointment on Monday for an injection with Dr. Mariah Milling. She presents today to have x-rays of her back to ensure she did not injure any bony structure. She is still anticipating surgery on September 27, 2020.  Interval History: 06/08/2020: Visit w/ Dr. Myer Haff: [name] presents back to see me. She continues to have issues as noted below. She has now tried physical therapy which did not help her pain. She is previously had injections which helped but did not last. She is unable to be as active as she would like. She has been on treatment for osteoporosis for 2 months and has discontinued smoking.   Review of Systems:  A 10 point review of systems is negative, except for the pertinent positives and negatives detailed in the HPI.  Past Medical History: Past Medical History:  Diagnosis Date  . Anemia  . Angina pectoris (CMS-HCC)  . Arthritis  . Depression  . Gastritis 05/13/2016  . GERD (gastroesophageal reflux disease)  . Hypertension  . Lumbago  . Migraine  . Migraines  . Neuropathy  . Obesity  . Ovarian cyst  . Redundant colon 05/20/2016  . Reflux esophagitis 05/13/2016   Past Surgical History: Past Surgical History:  Procedure Laterality Date  . COLONOSCOPY 01/14/2011  5 yrs  . COLONOSCOPY 05/13/2016  Poor colon prep/Rescheduled to 05/20/2016  . COLONOSCOPY 05/20/2016  Redundant colon/Otherwise normal/PHx colon polyps/Repeat 67yrs/MUS  . EGD 05/13/2016  Reflux  esophagitis/Gastritis/No Repeat/MUS  . EGD 10/12/2018  Esophagitis/Gastritis/No Repeat/MUS  . endoscopic partial plantar fascial release left heel Left 02/2016  [place]  . Gastric bypass surgery  . HYSTERECTOMY 07/2010  . JOINT REPLACEMENT  . L4-5 laminectomy, bilateral L4 & L5 foraminotomies, L4-5 interbody arthrodesis, L4-5posterolateral arthrodesis utilizing nonsegmental pedical screw instrumentation 03/14/2014  Dr. Julio Sicks  . LEFT TOTAL HIP ARTHROPLASTY Left 01/17/2014  . SPINE SURGERY   Allergies  Allergen Reactions  . Prednisone Palpitations    Will take if needed    Current Meds  Medication Sig  . Calcium Carbonate-Vitamin D 600-400 MG-UNIT tablet Take 2 tablets by mouth 2 (two) times daily.  . Coenzyme Q10 100 MG capsule Take 100 mg by mouth daily.  . cyanocobalamin (,VITAMIN B-12,) 1000 MCG/ML injection Inject 1 mL (1,000 mcg total) into the muscle every 30 (thirty) days.  . fexofenadine (ALLEGRA) 180 MG tablet Take 180 mg by mouth daily as needed for allergies.  [name] gabapentin (NEURONTIN) 600 MG tablet Take 600 mg by mouth 2 (two) times daily.  [name] HYDROcodone-acetaminophen (NORCO/VICODIN) 5-325 MG tablet Take 1 tablet by mouth every 6 (six) hours as needed for severe pain.  [name] losartan (COZAAR) 50 MG tablet Take 50 mg by mouth daily.  . meloxicam (MOBIC) 15 MG tablet Take 15 mg by mouth daily.  . Multiple Vitamin (MULTIVITAMIN) tablet Take 1 tablet by mouth daily.  . Omega-3 Fatty Acids (FISH OIL) 1200 MG CAPS Take 1,200 mg by mouth daily.   . pantoprazole (PROTONIX) 40 MG tablet Take 40 mg by mouth 2 (two)  times daily.   . sertraline (ZOLOFT) 100 MG tablet Take 1 tablet (100 mg total) by mouth daily.  . sucralfate (CARAFATE) 1 g tablet Take 1 g by mouth 2 (two) times daily.   . traZODone (DESYREL) 100 MG tablet Take 100-200 mg by mouth at bedtime as needed for sleep.  . Vitamin D, Ergocalciferol, (DRISDOL) 50000 units CAPS capsule Take 50,000 Units by mouth 2 (two) times a  week.     Social History: Social History   Tobacco Use  . Smoking status: Former Games developer  . Smokeless tobacco: Never Used  Vaping Use  . Vaping Use: Never used  Substance Use Topics  . Alcohol use: Not Currently  Alcohol/week: 0.0 standard drinks  . Drug use: No   Family Medical History: Family History  Problem Relation Age of Onset  . High blood pressure (Hypertension) Mother  . Diabetes type II Mother  . Osteoporosis (Thinning of bones) Mother  . Asthma Mother  . Allergies Mother  . Mental illness Mother  . Rheum arthritis Mother  . High blood pressure (Hypertension) Father  . Coronary Artery Disease (Blocked arteries around heart) Father  . Myocardial Infarction (Heart attack) Father 61  . High blood pressure (Hypertension) Sister  . High blood pressure (Hypertension) Maternal Grandmother  . Diabetes type II Maternal Grandmother  . Osteoporosis (Thinning of bones) Maternal Grandmother  . Prostate cancer Maternal Grandfather  . Coronary Artery Disease (Blocked arteries around heart) Maternal Grandfather  . Colon cancer Maternal Grandfather   Physical Examination:  Vitals:   11/22/20 0708  BP: (!) 154/91  Pulse: 64  Resp: 16  Temp: 98.3 F (36.8 C)  SpO2: 99%    Heart sounds normal no MRG. Chest Clear to Auscultation Bilaterally.    General: Patient is well developed, well nourished, calm, collected, and in no apparent distress. Attention to examination is appropriate.  Psychiatric: Patient is non-anxious.  Head: Pupils equal, round, and reactive to light.  ENT: Oral mucosa appears well hydrated.  Neck: Supple. Full range of motion.  Respiratory: Patient is breathing without any difficulty.  Extremities: No edema.  Vascular: Palpable dorsal pedal pulses.  Skin: On exposed skin, there are no abnormal skin lesions.  NEUROLOGICAL:   Awake, alert, oriented to person, place, and time. Speech is clear and fluent. Fund of knowledge is appropriate.    Cranial Nerves: Pupils equal round and reactive to light. Facial tone is symmetric. Facial sensation is symmetric. Shoulder shrug is symmetric. Tongue protrusion is midline. There is no pronator drift.  ROM of spine: full. Palpation of spine: TTP over R lumbar spine.  Strength: Side Iliopsoas Quads Hamstring PF DF EHL  R 5 5 5 5 5 5   L 5 5 5 5 5 5    Clonus is not present.  Bilateral upper and lower extremity sensation is intact to light touch.  Gait is antalgic.   Medical Decision Making  Imaging: AP, lateral, flexion-extension lumbar spine images obtained today, pending final read but on my read no evidence of hardware complication or fracture.  MRI L spine 01/07/2020 L3-L4: Disc bulge. Superimposed broad-based left center to left  foraminal disc protrusion. Advanced facet arthrosis with ligamentum  flavum hypertrophy. Small bilateral facetjoint effusions. Moderate  bilateral subarticular and central canal stenosis appears slightly  progressed. Unchanged bilateral neural foraminal narrowing (moderate  right, severe left).   L4-L5: Trace anterolisthesis. Prior posterior decompression and  posterior spinal fusion. Facet hypertrophy. Spinal canal widely  patent. No significant foraminal stenosis.  L5-S1: Small disc bulge. Moderate facet arthrosis (greater on the  right). No significant spinal canal stenosis. Bilateral neural  foraminal narrowing (moderate right, mild left).   IMPRESSION:  Redemonstrated sequela of prior L4-L5 posterior decompression and  fusion. No significant canal or foraminal stenosis at this level.   At L3-L4, there is a disc bulge with superimposed broad-based left  center to left foraminal disc protrusion. Advanced facet arthrosis  with ligamentum flavum hypertrophy. Bilateral facet joint effusions.  Moderate bilateral subarticular and central canal stenosis appears  slightly progressed from MRI 04/01/2018. Unchanged bilateral neural  foraminal  narrowing (moderate right, severe left). Correlate for  left L3 radiculopathy.   Lumbar spondylosis is otherwise unchanged. No significant spinal  canal stenosis at the remaining levels. Additional sites of neural  foraminal narrowing greatest on the right at L5-S1 (moderate at this  site).   Electronically Signed  By: Jackey Loge DO  On: 01/07/2020 19:50  CT L spine 04/17/2020 IMPRESSION:  Mild subarticular stenosis bilaterally L3-4 unchanged   Moderate spinal stenosis and moderate subarticular stenosis  bilaterally L3-4. Scoliosis and asymmetric disc degeneration on the  left.   Solid fusion L4-5 without stenosis   Right foraminal disc protrusion L5-S1 with right L5 nerve root  impingement.   Electronically Signed  By: Marlan Palau M.D.  On: 04/17/2020 13:39  I have personally reviewed the images and agree with the above interpretation.  Assessment and Plan: [name] is a pleasant 60 y.o. 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  She has failed conservative management, and elects for surgical intervention.  Venetia Night MD Neurosurgery

## 2020-11-22 NOTE — Transfer of Care (Signed)
Immediate Anesthesia Transfer of Care Note  Patient: [name]  Procedure(s) Performed: L3-4 LATERAL INTERBODY FUSION WITH PLATING (N/A )  Patient Location: PACU  Anesthesia Type:General  Level of Consciousness: drowsy  Airway & Oxygen Therapy: Patient connected to face mask  Post-op Assessment: Report given to RN, Post -op Vital signs reviewed and stable and Patient moving all extremities X 4  Post vital signs: stable  Last Vitals:  Vitals Value Taken Time  BP 152/115 11/22/20 1041  Temp    Pulse 82 11/22/20 1043  Resp 18 11/22/20 1043  SpO2 100 % 11/22/20 1043  Vitals shown include unvalidated device data.  Last Pain:  Vitals:   11/22/20 0708  TempSrc: Temporal  PainSc: 6          Complications: No complications documented.

## 2020-11-23 ENCOUNTER — Inpatient Hospital Stay: Payer: Medicare Other

## 2020-11-23 ENCOUNTER — Encounter: Payer: Self-pay | Admitting: Neurosurgery

## 2020-11-23 MED ORDER — METHOCARBAMOL 500 MG PO TABS: 500.0000 mg | ORAL_TABLET | Freq: Four times a day (QID) | ORAL | 0 refills | Status: AC | PRN

## 2020-11-23 NOTE — Evaluation (Signed)
Occupational Therapy Evaluation Patient Details Name: [name] MRN: [id number] DOB: [birth year] Today's Date: 11/23/2020    History of Present Illness 60 y/o female s/p L3-4 fusion 11/22/20, h/o L4-5 fusion 2015.   Clinical Impression   Pt seen for OT evaluation this date in setting of acute hospitalization s/p Lumbar fusion. Pt presents this date reporting that she feels "100% improved". Pt endorses that while she has maintained mostly being INDEP, states that in recent months, she has put on weight d/t increased back pain and has been less and less able to perform HH IADLs including cooking and cleaning, relying on her husband for assistance. OT educates re: no BLT for post-op back sx, how to apply brace, log-roll for OOB, use of AE for LB ADLs. Pt with good understanding of all education. She is noted to be able to appropriately get OOB with <10% cues and don brace safely. She demos good control for ADL transfers and performs all mobility with no assist or AD. Pt overall very INDEP on evaluation with exception of LB dressing for which she requires MIN A for R LE d/t some decreased hip flex/ext strength. Pt does report this has been new since sx, but appears to be getting better and better with time. Pt with good awareness of precautions after reviewing with OT. All ed and assessment completed at this time. No further OT needs identified. Pt safe to d/c home with occasional assist from spouse PRN, from OT standpoint.     Follow Up Recommendations  No OT follow up    Equipment Recommendations  Other (comment) (sock aide)    Recommendations for Other Services       Precautions / Restrictions Precautions Precautions: Back Required Braces or Orthoses: Spinal Brace Spinal Brace: Lumbar corset Restrictions Weight Bearing Restrictions: No      Mobility Bed Mobility Overal bed mobility: Independent             General bed mobility comments: log roll technique education for  protection of operative site.    Transfers Overall transfer level: Independent Equipment used: None             General transfer comment: no assist, good control, good safety awareness.    Balance Overall balance assessment: Independent                                         ADL either performed or assessed with clinical judgement   ADL Overall ADL's : Modified independent                                       General ADL Comments: Pt requires MIN A for LB ADLs including bathing, drying, dressing including threading socks/shoes to R LE d/t precautions and decreased R LE strength at this time. Able to perform all other tasks I'ly. Ed re: use of AE for LB ADLs and pt with appropriate understanding of sequence.     Vision Patient Visual Report: No change from baseline       Perception     Praxis      Pertinent Vitals/Pain Pain Assessment: No/denies pain     Hand Dominance     Extremity/Trunk Assessment Upper Extremity Assessment Upper Extremity Assessment: Overall WFL for tasks assessed   Lower Extremity Assessment Lower Extremity  Assessment: Overall WFL for tasks assessed;RLE deficits/detail RLE Deficits / Details: R hip flex/ext weaker than L, difficulty using cross-leg technique in sitting for fxl tasks like dressing. Pt does state this is new post-op'ly, but appears to be getting better as the day foes on.       Communication Communication Communication: No difficulties   Cognition Arousal/Alertness: Awake/alert Behavior During Therapy: WFL for tasks assessed/performed Overall Cognitive Status: Within Functional Limits for tasks assessed                                     General Comments       Exercises Other Exercises Other Exercises: OT educates re: no BLT for post-op back sx, how to apply brace, log-roll for OOB, use of AE for LB ADLs. Pt with good understanding of all education.   Shoulder  Instructions      Home Living Family/patient expects to be discharged to:: Private residence Living Arrangements: Spouse/significant other Available Help at Discharge: Available 24 hours/day (spouse took 2 weeks off of work to help out in pt's recovery upon d/c) Type of Home: House Home Access: Level entry     Home Layout: Two level Alternate Level Stairs-Number of Steps: flight, does have chair lift Alternate Level Stairs-Rails: Right Bathroom Shower/Tub: Walk-in shower         Home Equipment: None          Prior Functioning/Environment Level of Independence: Independent        Comments: Pt reports she has had to greatly decrease activity/mobility recently due to back pain but typically is active and independent. Reports occasional use of RW for stability if she "over-does it"        OT Problem List: Decreased range of motion;Decreased activity tolerance      OT Treatment/Interventions: Self-care/ADL training;DME and/or AE instruction;Therapeutic activities;Patient/family education    OT Goals(Current goals can be found in the care plan section) Acute Rehab OT Goals Patient Stated Goal: go home OT Goal Formulation: All assessment and education complete, DC therapy  OT Frequency:     Barriers to D/C:            Co-evaluation              AM-PAC OT "6 Clicks" Daily Activity     Outcome Measure Help from another person eating meals?: None Help from another person taking care of personal grooming?: None Help from another person toileting, which includes using toliet, bedpan, or urinal?: None Help from another person bathing (including washing, rinsing, drying)?: None Help from another person to put on and taking off regular upper body clothing?: None Help from another person to put on and taking off regular lower body clothing?: A Little 6 Click Score: 23   End of Session Nurse Communication: Mobility status  Activity Tolerance: Patient tolerated  treatment well Patient left: Other (comment) (seated EOB with LPN present to administer medication)  OT Visit Diagnosis: Muscle weakness (generalized) (M62.81)                Time: 1610-9604 OT Time Calculation (min): 31 min Charges:  OT General Charges $OT Visit: 1 Visit OT Evaluation $OT Eval Low Complexity: 1 Low OT Treatments $Self Care/Home Management : 23-37 mins  Rejeana Brock, MS, OTR/L ascom [phone] 11/23/20, 2:15 PM

## 2020-11-23 NOTE — Evaluation (Signed)
Physical Therapy Evaluation Patient Details Name: [name] MRN: [id number] DOB: [birth year] Today's Date: 11/23/2020   History of Present Illness  60 y/o female s/p L3-4 fusion 11/22/20, h/o L4-5 fusion 2015.  Clinical Impression  Pt did very well with PT exam.  She did show some nonchalance about spinal precautions, but did show increased awareness and respect for them after discussion.  She showed no functional limitations apart from mild limp that is exceedingly better than pre-surgery.  Pt does not require further PT intervention.    Follow Up Recommendations No PT follow up    Equipment Recommendations  None recommended by PT    Recommendations for Other Services       Precautions / Restrictions Precautions Precautions: Back Required Braces or Orthoses: Spinal Brace Spinal Brace: Lumbar corset Restrictions Weight Bearing Restrictions: No      Mobility  Bed Mobility Overal bed mobility: Independent             General bed mobility comments: Pt easily and quickly gets to EOB, reminders to be aware of back positioning/precautions    Transfers Overall transfer level: Independent Equipment used: None             General transfer comment: Pt easily and confidently gets to standing w/o UEs  Ambulation/Gait Ambulation/Gait assistance: Modified independent (Device/Increase time) Gait Distance (Feet): 250 Feet Assistive device: None       General Gait Details: Pt did well with consistent speed and good confidence.  She had very mild L limp that she states is exceedingly better than pre-surgery and she reports not having been able to walk like this in months.  No fatigue or safety issues.  Stairs Stairs: Yes Stairs assistance: Modified independent (Device/Increase time) Stair Management: Two rails Number of Stairs: 4    Wheelchair Mobility    Modified Rankin (Stroke Patients Only)       Balance Overall balance assessment: Independent                                            Pertinent Vitals/Pain Pain Assessment: No/denies pain    Home Living Family/patient expects to be discharged to:: Private residence Living Arrangements: Spouse/significant other Available Help at Discharge: Available 24 hours/day (husband took 2 weeks off work)   Home Access: Level entry     Home Layout: Two level        Prior Function Level of Independence: Independent         Comments: Pt reports she has had to greatly decrease activity/mobility recently due to back pain but typically is active and independent     Hand Dominance        Extremity/Trunk Assessment   Upper Extremity Assessment Upper Extremity Assessment: Overall WFL for tasks assessed    Lower Extremity Assessment Lower Extremity Assessment: Overall WFL for tasks assessed (L hip flx/Abd and L knee flx both functional but weaker than L.  Pre-surgery LLE numbness resolved)       Communication   Communication: No difficulties  Cognition Arousal/Alertness: Awake/alert Behavior During Therapy: WFL for tasks assessed/performed Overall Cognitive Status: Within Functional Limits for tasks assessed                                        General Comments  Exercises     Assessment/Plan    PT Assessment Patent does not need any further PT services  PT Problem List         PT Treatment Interventions      PT Goals (Current goals can be found in the Care Plan section)  Acute Rehab PT Goals Patient Stated Goal: go home PT Goal Formulation: All assessment and education complete, DC therapy    Frequency     Barriers to discharge        Co-evaluation               AM-PAC PT "6 Clicks" Mobility  Outcome Measure Help needed turning from your back to your side while in a flat bed without using bedrails?: None Help needed moving from lying on your back to sitting on the side of a flat bed without using bedrails?:  None Help needed moving to and from a bed to a chair (including a wheelchair)?: None Help needed standing up from a chair using your arms (e.g., wheelchair or bedside chair)?: None Help needed to walk in hospital room?: None Help needed climbing 3-5 steps with a railing? : None 6 Click Score: 24    End of Session Equipment Utilized During Treatment: Back brace Activity Tolerance: Patient tolerated treatment well Patient left: in chair;with call bell/phone within reach Nurse Communication: Mobility status PT Visit Diagnosis: Muscle weakness (generalized) (M62.81);Difficulty in walking, not elsewhere classified (R26.2)    Time: 7124-5809 PT Time Calculation (min) (ACUTE ONLY): 15 min   Charges:   PT Evaluation $PT Eval Low Complexity: 1 Low          Malachi Pro, DPT 11/23/2020, 12:14 PM

## 2020-11-23 NOTE — Plan of Care (Signed)
  Problem: Education: Goal: Knowledge of General Education information will improve Description: Including pain rating scale, medication(s)/side effects and non-pharmacologic comfort measures Outcome: Progressing   Problem: Health Behavior/Discharge Planning: Goal: Ability to manage health-related needs will improve Outcome: Progressing   Problem: Clinical Measurements: Goal: Ability to maintain clinical measurements within normal limits will improve Outcome: Progressing Goal: Will remain free from infection Outcome: Progressing   Problem: Education: Goal: Ability to verbalize activity precautions or restrictions will improve Outcome: Progressing Goal: Knowledge of the prescribed therapeutic regimen will improve Outcome: Progressing Goal: Understanding of discharge needs will improve Outcome: Progressing   Problem: Activity: Goal: Ability to avoid complications of mobility impairment will improve Outcome: Progressing Goal: Ability to tolerate increased activity will improve Outcome: Progressing Goal: Will remain free from falls Outcome: Progressing   Problem: Bowel/Gastric: Goal: Gastrointestinal status for postoperative course will improve Outcome: Progressing   Problem: Clinical Measurements: Goal: Ability to maintain clinical measurements within normal limits will improve Outcome: Progressing Goal: Postoperative complications will be avoided or minimized Outcome: Progressing Goal: Diagnostic test results will improve Outcome: Progressing   Problem: Pain Management: Goal: Pain level will decrease Outcome: Progressing   Problem: Skin Integrity: Goal: Will show signs of wound healing Outcome: Progressing   Problem: Health Behavior/Discharge Planning: Goal: Identification of resources available to assist in meeting health care needs will improve Outcome: Progressing   Problem: Bladder/Genitourinary: Goal: Urinary functional status for postoperative course will  improve Outcome: Progressing

## 2020-11-23 NOTE — Progress Notes (Signed)
    Attending Progress Note  Surgery - L3-4 XLIF with plate (05/29/1883)  History: [name] is here for adjacent segment disease with spondylolisthesis.  POD1: Had a good night.  Has been ambulating to the bathroom without issue.  Has not walked outside her room.  POD0: Legs feeling better.  Expected back pain.  Physical Exam: Vitals:   11/23/20 0125 11/23/20 0502  BP: 110/69 115/71  Pulse: 66 85  Resp: 18 18  Temp: 98.5 F (36.9 C) 98.2 F (36.8 C)  SpO2: 97% 98%    AA Ox3 CNI  Strength:5/5 throughout BLE  Dressing c/d/i.  Data:  No results for input(s): NA, K, CL, CO2, BUN, CREATININE, LABGLOM, GLUCOSE, CALCIUM in the last 168 hours. No results for input(s): AST, ALT, ALKPHOS in the last 168 hours.  Invalid input(s): TBILI   No results for input(s): WBC, HGB, HCT, PLT in the last 168 hours. No results for input(s): APTT, INR in the last 168 hours.       Other tests/results: none to review  Assessment/Plan:  [name] is doing well after XLIF.  - mobilize - pain control - DVT prophylaxis - PTOT - Films today - Dispo planning   Venetia Night MD, [facility] Department of Neurosurgery

## 2020-11-23 NOTE — Discharge Summary (Signed)
Physician Discharge Summary  Patient ID: [name] MRN: [id number] DOB/AGE: [birth year] 60 y.o.  Admit date: 11/22/2020 Discharge date: 11/23/2020  Admission Diagnoses: Lumbar adjacent segment disease with spondylolisthesis  Discharge Diagnoses:  Active Problems:   Lumbar adjacent segment disease with spondylolisthesis   Discharged Condition: good  Hospital Course: [name] is a 60 yo female who presented with adjacent segment disease with spondylolisthesis.  She failed conservative management and elected for surgical intervention.  She underwent elective surgery. She did very well with surgery and was stable for discharge on POD1.  Consults: None  Significant Diagnostic Studies: radiology: X-Ray: Lumbar spine xrays show good placement of implants  Treatments: surgery: L3-4 XLIF with plating  Discharge Exam: Blood pressure 100/72, pulse 87, temperature 98.4 F (36.9 C), temperature source Oral, resp. rate 16, height 5\' 3"  (1.6 m), weight 84.5 kg, SpO2 93 %. General appearance: alert and cooperative  CNI Doing well 5/5 throughout Incision c/d/i  Disposition: home  Discharge Instructions    Incentive spirometry RT   Complete by: As directed      Allergies as of 11/23/2020      Reactions   Prednisone Palpitations   Will take if needed      Medication List    STOP taking these medications   Advil PM 200-25 MG Caps Generic drug: Ibuprofen-diphenhydrAMINE HCl   meloxicam 15 MG tablet Commonly known as: MOBIC     TAKE these medications   aspirin 81 MG tablet Take 81 mg by mouth daily.   busPIRone 5 MG tablet Commonly known as: BUSPAR Take 5 mg by mouth 2 (two) times daily.   Calcium Carbonate-Vitamin D 600-400 MG-UNIT tablet Take 2 tablets by mouth 2 (two) times daily.   Calcium Carbonate-Vitamin D 600-200 MG-UNIT Tabs Take by mouth.   Coenzyme Q10 100 MG capsule Take 100 mg by mouth daily.   cyanocobalamin 1000 MCG/ML injection Commonly  known as: (VITAMIN B-12) Inject 1 mL (1,000 mcg total) into the muscle every 30 (thirty) days.   escitalopram 10 MG tablet Commonly known as: LEXAPRO Take 10 mg by mouth daily.   fexofenadine 180 MG tablet Commonly known as: ALLEGRA Take 180 mg by mouth daily as needed for allergies.   Fish Oil 1200 MG Caps Take 1,200 mg by mouth daily.   gabapentin 600 MG tablet Commonly known as: NEURONTIN Take 600 mg by mouth 2 (two) times daily.   HYDROcodone-acetaminophen 5-325 MG tablet Commonly known as: NORCO/VICODIN Take 1 tablet by mouth every 6 (six) hours as needed for severe pain.   losartan 50 MG tablet Commonly known as: COZAAR Take 50 mg by mouth daily.   methocarbamol 500 MG tablet Commonly known as: ROBAXIN Take 1 tablet (500 mg total) by mouth every 6 (six) hours as needed for muscle spasms.   multivitamin tablet Take 1 tablet by mouth daily.   pantoprazole 40 MG tablet Commonly known as: PROTONIX Take 40 mg by mouth 2 (two) times daily.   sertraline 100 MG tablet Commonly known as: ZOLOFT Take 1 tablet (100 mg total) by mouth daily.   sucralfate 1 g tablet Commonly known as: CARAFATE Take 1 g by mouth 2 (two) times daily.   traZODone 100 MG tablet Commonly known as: DESYREL Take 100-200 mg by mouth at bedtime as needed for sleep.   Vitamin D (Ergocalciferol) 1.25 MG (50000 UNIT) Caps capsule Commonly known as: DRISDOL Take 50,000 Units by mouth 2 (two) times a week.       Follow-up  Information    Venetia Night, MD Follow up in 2 week(s).   Specialty: Neurosurgery Why: already scheduled Contact information: [address] [place] [address] [phone]               Signed: Venetia Night 11/23/2020, 5:36 PM

## 2020-11-23 NOTE — Discharge Instructions (Signed)
Your surgeon has performed an operation on your lumbar spine (low back) to relieve pressure on one or more nerves. Many times, patients feel better immediately after surgery and can overdo it. Even if you feel well, it is important that you follow these activity guidelines. If you do not let your back heal properly from the surgery, you can increase the chance of a disc herniation and/or return of your symptoms. The following are instructions to help in your recovery once you have been discharged from the hospital.  * Do not take anti-inflammatory medications after surgery (naproxen [Aleve], ibuprofen [Advil, Motrin], celecoxib [Celebrex], etc.)  Activity    No bending, lifting, or twisting (BLT). Avoid lifting objects heavier than 10 pounds (gallon milk jug).  Where possible, avoid household activities that involve lifting, bending, pushing, or pulling such as laundry, vacuuming, grocery shopping, and childcare. Try to arrange for help from friends and family for these activities while your back heals.  Increase physical activity slowly as tolerated.  Taking short walks is encouraged, but avoid strenuous exercise. Do not jog, run, bicycle, lift weights, or participate in any other exercises unless specifically allowed by your doctor. Avoid prolonged sitting, including car rides.  Talk to your doctor before resuming sexual activity.  You should not drive until cleared by your doctor.  Until released by your doctor, you should not return to work or school.  You should rest at home and let your body heal.   You may shower one day after your surgery.  After showering, lightly dab your incision dry. Do not take a tub bath or go swimming for 3 weeks, or until approved by your doctor at your follow-up appointment.  If you smoke, we strongly recommend that you quit.  Smoking has been proven to interfere with normal healing in your back and will dramatically reduce the success rate of your surgery.  Please contact [facility] (800-QUIT-NOW) and use the resources at www.[facility].com for assistance in stopping smoking.  Surgical Incision   If you have a dressing on your incision, you may remove it three days after your surgery. Keep your incision area clean and dry.  If you have staples or stitches on your incision, you should have a follow up scheduled for removal. If you do not have staples or stitches, you will have steri-strips (small pieces of surgical tape) or Dermabond glue. The steri-strips/glue should begin to peel away within about a week (it is fine if the steri-strips fall off before then). If the strips are still in place one week after your surgery, you may gently remove them.  Diet            You may return to your usual diet. Be sure to stay hydrated.  When to Contact us  Although your surgery and recovery will likely be uneventful, you may have some residual numbness, aches, and pains in your back and/or legs. This is normal and should improve in the next few weeks.  However, should you experience any of the following, contact us immediately:  New numbness or weakness  Pain that is progressively getting worse, and is not relieved by your pain medications or rest  Bleeding, redness, swelling, pain, or drainage from surgical incision  Chills or flu-like symptoms  Fever greater than 101.0 F (38.3 C)  Problems with bowel or bladder functions  Difficulty breathing or shortness of breath  Warmth, tenderness, or swelling in your calf  Contact Information  During office hours (Monday-Friday 9 am to  5 pm), please call your physician at [phone]  After hours and weekends, please call [phone] and speak with the answering service, who will contact the doctor on call.  If that fails, call the [facility] Operator at [phone] and ask for the Neurosurgery Resident On Call   For a life-threatening emergency, call 911

## 2020-11-23 NOTE — Progress Notes (Signed)
    Attending Progress Note  Surgery - L3-4 XLIF with plate (0/11/90)  History: [name] is here for adjacent segment disease with spondylolisthesis.  POD0: Legs feeling better.  Expected back pain.  Physical Exam: Vitals:   11/23/20 0125 11/23/20 0502  BP: 110/69 115/71  Pulse: 66 85  Resp: 18 18  Temp: 98.5 F (36.9 C) 98.2 F (36.8 C)  SpO2: 97% 98%    AA Ox3 CNI  Strength:5/5 throughout BLE  Data:  No results for input(s): NA, K, CL, CO2, BUN, CREATININE, LABGLOM, GLUCOSE, CALCIUM in the last 168 hours. No results for input(s): AST, ALT, ALKPHOS in the last 168 hours.  Invalid input(s): TBILI   No results for input(s): WBC, HGB, HCT, PLT in the last 168 hours. No results for input(s): APTT, INR in the last 168 hours.       Other tests/results: none to review  Assessment/Plan:  [name] is doing well after XLIF.  - mobilize - pain control - DVT prophylaxis - PTOT   Venetia Night MD, [facility] Department of Neurosurgery

## 2020-11-23 NOTE — Anesthesia Postprocedure Evaluation (Signed)
Anesthesia Post Note  Patient: [name]  Procedure(s) Performed: L3-4 LATERAL INTERBODY FUSION WITH PLATING (N/A )  Patient location during evaluation: PACU Anesthesia Type: General Level of consciousness: awake and alert Pain management: pain level controlled Vital Signs Assessment: post-procedure vital signs reviewed and stable Respiratory status: spontaneous breathing, nonlabored ventilation and respiratory function stable Cardiovascular status: blood pressure returned to baseline and stable Postop Assessment: no apparent nausea or vomiting Anesthetic complications: no   No complications documented.   Last Vitals:  Vitals:   11/23/20 0502 11/23/20 0757  BP: 115/71 120/82  Pulse: 85 88  Resp: 18 18  Temp: 36.8 C (!) 36.3 C  SpO2: 98% 93%    Last Pain:  Vitals:   11/23/20 0757  TempSrc: Oral  PainSc:                  Karleen Hampshire

## 2020-12-26 ENCOUNTER — Other Ambulatory Visit: Payer: Self-pay | Admitting: Hematology and Oncology

## 2020-12-26 DIAGNOSIS — E538 Deficiency of other specified B group vitamins: Secondary | ICD-10-CM

## 2021-01-02 ENCOUNTER — Other Ambulatory Visit: Payer: Self-pay

## 2021-01-02 DIAGNOSIS — K9589 Other complications of other bariatric procedure: Secondary | ICD-10-CM

## 2021-01-02 DIAGNOSIS — D508 Other iron deficiency anemias: Secondary | ICD-10-CM

## 2021-01-02 DIAGNOSIS — D7589 Other specified diseases of blood and blood-forming organs: Secondary | ICD-10-CM

## 2021-01-02 DIAGNOSIS — E538 Deficiency of other specified B group vitamins: Secondary | ICD-10-CM

## 2021-01-04 ENCOUNTER — Inpatient Hospital Stay: Payer: Medicare Other | Attending: Hematology and Oncology

## 2021-01-06 NOTE — Progress Notes (Incomplete)
[facility]  [address], [address] [place], [place] [address] Phone: [phone]  Fax: [phone]   Clinic day:  01/06/2021  Chief Complaint: [name] is a 60 y.o. female s/p gastric bypass surgery (1998) with subsequent iron deficiency anemia and B12 deficiency who is seen for review of work-up and discussion regarding direction of therapy.  HPI: The patient was last seen in the hematology clinic on 11/06/2020. At that time, she felt "good."  All of her symptoms were stable. She denied any new concerns.  The patient underwent L3-L4 lateral interbody fusion with plating on 11/22/2020 by Dr. Myer Haff. Estimated blood loss was 25 mL.  The patient saw Tawni Pummel, [place] on 12/26/2020. She reported chronic diarrhea since her gastric bypass. Her symptoms has worsened since her recent back surgery. Pancreatic elastase was 167. She was prescribed Creon.  Colonoscopy is planned for 04/2021.  During the interim, ***   Past Medical History:  Diagnosis Date  . Anemia   . Anxiety   . Arthritis    hands, fingers, lower back  . Complication of anesthesia    loss control of bowels after block for hip replacement  . Depression   . Gastritis   . GERD (gastroesophageal reflux disease)   . Headache    hx of migraines in past  . Hypertension   . IDA (iron deficiency anemia) 03/17/2015  . IDA (iron deficiency anemia) 03/17/2015  . Neck pain    s/p fall approx 1 month ago.  Transient. "Random"    Past Surgical History:  Procedure Laterality Date  . ABDOMINAL HYSTERECTOMY  2008  . ANTERIOR LATERAL LUMBAR FUSION WITH PERCUTANEOUS SCREW 1 LEVEL N/A 11/22/2020   Procedure: L3-4 LATERAL INTERBODY FUSION WITH PLATING;  Surgeon: Venetia Night, MD;  Location: [facility] ORS;  Service: Neurosurgery;  Laterality: N/A;  3rd case  . APPENDECTOMY    . BACK SURGERY  03/14/14   L4-5 laminectomy.  Dr Dutch Quint, [facility]  . CHOLECYSTECTOMY    . COLONOSCOPY    . COLONOSCOPY  WITH PROPOFOL N/A 05/13/2016   Procedure: COLONOSCOPY WITH PROPOFOL;  Surgeon: [name], MD;  Location: [facility] ENDOSCOPY;  Service: Endoscopy;  Laterality: N/A;  . COLONOSCOPY WITH PROPOFOL N/A 05/20/2016   Procedure: COLONOSCOPY WITH PROPOFOL;  Surgeon: [name], MD;  Location: [facility] END[facility]COPY;  Service: Endoscopy;  Laterality: N/A;  . ESOPHAGOGASTRODUODENOSCOPY (EGD) WITH PROPOFOL N/A 05/13/2016   Procedure: ESOPHAGOGASTRODUODENOSCOPY (EGD) WITH PROPOFOL;  Surgeon: [name], MD;  Location: [facility] ENDOSCOPY;  Service: Endoscopy;  Laterality: N/A;  . ESOPHAGOGASTRODUODENOSCOPY (EGD) WITH PROPOFOL N/A 10/12/2018   Procedure: ESOPHAGOGASTRODUODENOSCOPY (EGD) WITH PROPOFOL;  Surgeon: [name], MD;  Location: [facility] ENDOSCOPY;  Service: Endoscopy;  Laterality: N/A;  . GASTRIC BYPASS  2002  . JOINT REPLACEMENT Left 12/2013   hip replacement at [facility]  . PLANTAR FASCIA RELEASE Left 03/07/2016   Procedure: 1. Partial plantar fascial release with endoscopic procedure  2. Topaz fasciotomy percutaneously;  Surgeon: Recardo Evangelist, DPM;  Location: [facility];  Service: Podiatry;  Laterality: Left;  LMA WITH POPLITEAL TOPAZ  . TONSILLECTOMY      Family History  Problem Relation Age of Onset  . Diabetes Mother   . Stroke Mother   . Heart attack Father        died at age 43  . Heart disease Father   . Macular degeneration Maternal Grandmother   . Dementia Maternal Grandmother   . Prostate cancer Maternal Grandfather   .  Heart attack Paternal Grandmother   . Heart disease Paternal Grandmother   . Heart attack Paternal Grandfather   . Heart disease Paternal Grandfather   . Breast cancer Neg Hx     Social History:  reports that she has never smoked. She has never used smokeless tobacco. She reports previous alcohol use. She reports that she does not use drugs.  He denies any exposure to radiation or toxins.  The patient lives in [place].  The patient is alone***  today.  Allergies:  Allergies  Allergen Reactions  . Prednisone Palpitations    Will take if needed    Current Medications: Current Outpatient Medications  Medication Sig Dispense Refill  . aspirin 81 MG tablet Take 81 mg by mouth daily. (Patient not taking: Reported on 11/14/2020)    . busPIRone (BUSPAR) 5 MG tablet Take 5 mg by mouth 2 (two) times daily.  (Patient not taking: Reported on 11/14/2020)  1  . Calcium Carbonate-Vitamin D 600-200 MG-UNIT TABS Take by mouth. (Patient not taking: No sig reported)    . Calcium Carbonate-Vitamin D 600-400 MG-UNIT tablet Take 2 tablets by mouth 2 (two) times daily.    . Coenzyme Q10 100 MG capsule Take 100 mg by mouth daily.    . cyanocobalamin (,VITAMIN B-12,) 1000 MCG/ML injection INJECT 1 ML (1,000 MCG TOTAL) INTO THE MUSCLE EVERY 30 (THIRTY) DAYS. 3 mL 9  . escitalopram (LEXAPRO) 10 MG tablet Take 10 mg by mouth daily. (Patient not taking: Reported on 11/14/2020)    . fexofenadine (ALLEGRA) 180 MG tablet Take 180 mg by mouth daily as needed for allergies.    [name] gabapentin (NEURONTIN) 600 MG tablet Take 600 mg by mouth 2 (two) times daily.    [name] HYDROcodone-acetaminophen (NORCO/VICODIN) 5-325 MG tablet Take 1 tablet by mouth every 6 (six) hours as needed for severe pain.    [name] losartan (COZAAR) 50 MG tablet Take 50 mg by mouth daily.    . Multiple Vitamin (MULTIVITAMIN) tablet Take 1 tablet by mouth daily.    . Omega-3 Fatty Acids (FISH OIL) 1200 MG CAPS Take 1,200 mg by mouth daily.     . pantoprazole (PROTONIX) 40 MG tablet Take 40 mg by mouth 2 (two) times daily.   2  . sertraline (ZOLOFT) 100 MG tablet Take 1 tablet (100 mg total) by mouth daily. 90 tablet 1  . sucralfate (CARAFATE) 1 g tablet Take 1 g by mouth 2 (two) times daily.     . traZODone (DESYREL) 100 MG tablet Take 100-200 mg by mouth at bedtime as needed for sleep.    . Vitamin D, Ergocalciferol, (DRISDOL) 50000 units CAPS capsule Take 50,000 Units by mouth 2 (two) times a week.  3    No current facility-administered medications for this visit.    Review of Systems  Constitutional: Positive for malaise/fatigue. Negative for chills, diaphoresis, fever and weight loss (up 2 lbs).  HENT: Negative for congestion, ear discharge, ear pain, hearing loss, nosebleeds, sinus pain, sore throat and tinnitus.   Eyes: Negative for blurred vision.  Respiratory: Negative for cough, hemoptysis, sputum production and shortness of breath.   Cardiovascular: Negative for chest pain, palpitations and leg swelling.  Gastrointestinal: Negative for abdominal pain, blood in stool, constipation, diarrhea, heartburn, melena, nausea and vomiting.  Genitourinary: Negative for dysuria, frequency, hematuria and urgency.  Musculoskeletal: Positive for back pain. Negative for joint pain, myalgias and neck pain.  Skin: Negative for itching and rash.  Neurological: Positive for sensory change.  Negative for dizziness, tingling, weakness and headaches.  Endo/Heme/Allergies: Does not bruise/bleed easily.  Psychiatric/Behavioral: Negative for depression and memory loss. The patient is not nervous/anxious and does not have insomnia.   All other systems reviewed and are negative.  Performance Status (ECOG): 1***  Vital Signs There were no vitals taken for this visit.  Physical Exam Vitals and nursing note reviewed.  Constitutional:      General: She is not in acute distress.    Appearance: She is not diaphoretic.  HENT:     Head: Normocephalic and atraumatic.  Eyes:     General: No scleral icterus.    Conjunctiva/sclera: Conjunctivae normal.  Neurological:     Mental Status: She is alert.  Psychiatric:        Behavior: Behavior normal.        Thought Content: Thought content normal.        Judgment: Judgment normal.    No visits with results within 3 Day(s) from this visit.  Latest known visit with results is:  Hospital Outpatient Visit on 11/20/2020  Component Date Value Ref Range Status   . SARS Coronavirus 2 11/20/2020 NEGATIVE  NEGATIVE Final   Comment: (NOTE) SARS-CoV-2 target nucleic acids are NOT DETECTED.  The SARS-CoV-2 RNA is generally detectable in upper and lower respiratory specimens during the acute phase of infection. Negative results do not preclude SARS-CoV-2 infection, do not rule out co-infections with other pathogens, and should not be used as the sole basis for treatment or other patient management decisions. Negative results must be combined with clinical observations, patient history, and epidemiological information. The expected result is Negative.  Fact Sheet for Patients: HairSlick.nohttps://www.fda.gov/media/138098/download  Fact Sheet for Healthcare Providers: quierodirigir.comhttps://www.fda.gov/media/138095/download  This test is not yet approved or cleared by the [place] FDA and  has been authorized for detection and/or diagnosis of SARS-CoV-2 by FDA under an Emergency Use Authorization (EUA). This EUA will remain  in effect (meaning this test can be used) for the duration of the COVID-19 declaration under Se                          ction 564(b)(1) of the Act, 21 U.S.C. section 360bbb-3(b)(1), unless the authorization is terminated or revoked sooner.  Performed at [facility], [address] [place], [place] [address]   . ABO/RH(D) 11/20/2020 B POS   Final  . Antibody Screen 11/20/2020 NEG   Final  . Sample Expiration 11/20/2020 12/04/2020,2359   Final  . Extend sample reason 11/20/2020    Final                   Value:NO TRANSFUSIONS OR PREGNANCY IN THE PAST 3 MONTHS Performed at [facility], [address] [place], [place] [address]   . Color, Urine 11/20/2020 AMBER* YELLOW Final   BIOCHEMICALS MAY BE AFFECTED BY COLOR  . APPearance 11/20/2020 HAZY* CLEAR Final  . Specific Gravity, Urine 11/20/2020 1.029  1.005 - 1.030 Final  . pH 11/20/2020 5.0  5.0 - 8.0 Final  . Glucose, UA 11/20/2020 NEGATIVE  NEGATIVE mg/dL Final  . Hgb  urine dipstick 11/20/2020 SMALL* NEGATIVE Final  . Bilirubin Urine 11/20/2020 NEGATIVE  NEGATIVE Final  . Ketones, ur 11/20/2020 NEGATIVE  NEGATIVE mg/dL Final  . Protein, ur 82/95/621302/28/2022 30* NEGATIVE mg/dL Final  . Nitrite 08/65/784602/28/2022 NEGATIVE  NEGATIVE Final  . Glori LuisLeukocytes,Ua 11/20/2020 NEGATIVE  NEGATIVE Final  . RBC / HPF 11/20/2020 6-10  0 -  5 RBC/hpf Final  . WBC, UA 11/20/2020 0-5  0 - 5 WBC/hpf Final  . Bacteria, UA 11/20/2020 RARE* NONE SEEN Final  . Squamous Epithelial / LPF 11/20/2020 6-10  0 - 5 Final  . Mucus 11/20/2020 PRESENT   Final  . Budding Yeast 11/20/2020 PRESENT   Final  . Hyaline Casts, UA 11/20/2020 PRESENT   Final   Performed at [facility], [address] [place] [place], [place] [address]    Assessment:  [name] is a 60 y.o. female s/p gastric bypass (1998) and subsequent iron deficiency and B12 deficiency.  She is intolerant to oral iron.  She was initially on sublingual B12 then switched to B12 injections at home  Work-up on 10/30/2020 revealed a hematocrit of 40.5, hemoglobin 12.8, platelets 184,000, and WBC 4,600 with an ANC of 2600. CMP was normal. Ferritin was 104 with an iron saturation of 31% and a TIBC of 300. Reticulocyte count was 1.9%. Sed rate was 13 and CRP < 0.5. TSH was 1.273 with a free T4 of 0.82. Vitamin B12 was 288 and folate 15.7.   She has received Feraheme: 06/07/2015, 06/16/2015, 12/27/2016, 10/24/2017 and 05/12/2020.  She received Venofer on 02/03/2015.    Ferritin has been followed: 40 on 03/17/2015, 31 on 04/18/2015, 30 on 06/05/2015, 142 on 08/25/2015, 107102/06/2016, 85 on 03/13/2016, 68 on 06/14/2016, 46 on 12/13/2016, 113 on 03/05/2017, 122 on 04/25/2017, 90 on 10/24/2017, 135 on 01/14/2018, 101 on 02/27/2018, 106 on 06/29/2018, 79 on 01/04/2019, 61 on 05/09/2020, and 104 on 10/30/2020.  Iron saturation has been followed and has ranged between 8% and 37%.  Last iron studies on 05/09/2020 revealed a iron saturation of 35%  with a TIBC of 391.  Vitamin B12 has been followed: 974 on 08/25/2015, 526 on 09/27/2016, 2727 on 10/24/2017, 407106/03/2018, 1364 on 08/24/2019, 488 on 05/09/2020, and 288 on 10/30/2020.  Colonoscopy on 05/20/2016 revealed the sigmoid colon, descending colon and transverse colon were significantly redundant. There was some mild mucosal irritation at about 35-40 cm on withdrawal.  EGD on 10/12/2018 revealed a variable Z-line variable. There was LA Grade B erosive esophagitis. 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  There was bile gastritis. Roux-en-Y gastrojejunostomy with gastrojejunal anastomosis was characterized by an intact staple line and healthy appearing mucosa.    She has osteoporosis and receives Reclast.    Symptomatically, ***  Plan: 1.   Labs today: *** 2.   Iron deficiency anemia  She is s/p gastric bypass surgery and has difficulty absorbing oral iron.     She is intolerant of oral iron.  Re-review plan for IV iron to maintain a ferritin > 30 and iron saturation >= to 18%.  Ferritin goal is 100.  Switch IV iron preparation to Venofer.  Patient in agreement. 3.   B12 deficiency  B12 was 288 on 10/30/2020.  Patient is s/p gastric bypass surgery and is B12 deficient.  Re-review plan for B12 monthly.    Patient missed her last month injection. 4.   RTC 01/04/2021 for labs (CBC with diff, ferritin,iron studies, B12). 5.   RTC the following week for MD assess, review of labs and +/- Venofer.  I discussed the assessment and treatment plan with the patient.  The patient was provided an opportunity to ask questions and all were answered.  The patient agreed with the plan and demonstrated an understanding of the instructions.  The patient was advised to call back or seek an in person evaluation if the symptoms worsen or  if the condition fails to improve as anticipated.  I provided *** minutes of face-to-face time during this this encounter and > 50% was spent counseling as  documented under my assessment and plan.  Rosey Bath, MD, PhD 01/06/2021, 8:45 PM   I, Pietro Cassis, am acting as scribe for [employer]. Merlene Pulling, MD, PhD.  I, Melissa C. Merlene Pulling, MD, have reviewed the above documentation for accuracy and completeness, and I agree with the above.

## 2021-01-09 ENCOUNTER — Inpatient Hospital Stay: Payer: Medicare Other

## 2021-01-09 ENCOUNTER — Inpatient Hospital Stay: Payer: Medicare Other | Admitting: Hematology and Oncology

## 2021-01-09 DIAGNOSIS — K9589 Other complications of other bariatric procedure: Secondary | ICD-10-CM | POA: Insufficient documentation

## 2021-01-09 DIAGNOSIS — D508 Other iron deficiency anemias: Secondary | ICD-10-CM | POA: Insufficient documentation

## 2021-01-19 ENCOUNTER — Other Ambulatory Visit: Payer: Self-pay | Admitting: Family Medicine

## 2021-01-19 DIAGNOSIS — Z1231 Encounter for screening mammogram for malignant neoplasm of breast: Secondary | ICD-10-CM

## 2021-01-22 ENCOUNTER — Ambulatory Visit
Admission: RE | Admit: 2021-01-22 | Discharge: 2021-01-22 | Disposition: A | Discharge status: Home / Self Care | Payer: Medicare Other | Source: Ambulatory Visit | Attending: Family Medicine | Admitting: Family Medicine

## 2021-01-22 ENCOUNTER — Other Ambulatory Visit: Payer: Self-pay

## 2021-01-22 DIAGNOSIS — Z1231 Encounter for screening mammogram for malignant neoplasm of breast: Secondary | ICD-10-CM | POA: Diagnosis present

## 2021-02-07 ENCOUNTER — Other Ambulatory Visit: Payer: Self-pay

## 2021-02-07 ENCOUNTER — Inpatient Hospital Stay: Payer: Medicare Other | Attending: Oncology

## 2021-02-07 DIAGNOSIS — Z9884 Bariatric surgery status: Secondary | ICD-10-CM | POA: Insufficient documentation

## 2021-02-07 DIAGNOSIS — D509 Iron deficiency anemia, unspecified: Secondary | ICD-10-CM | POA: Insufficient documentation

## 2021-02-07 DIAGNOSIS — E538 Deficiency of other specified B group vitamins: Secondary | ICD-10-CM

## 2021-02-07 DIAGNOSIS — K9589 Other complications of other bariatric procedure: Secondary | ICD-10-CM

## 2021-02-07 LAB — CBC WITH DIFFERENTIAL/PLATELET
Abs Immature Granulocytes: 0.01 10*3/uL (ref 0.00–0.07)
Basophils Absolute: 0 10*3/uL (ref 0.0–0.1)
Basophils Relative: 1 %
Eosinophils Absolute: 0.2 10*3/uL (ref 0.0–0.5)
Eosinophils Relative: 4 %
HCT: 38.9 % (ref 36.0–46.0)
Hemoglobin: 12.1 g/dL (ref 12.0–15.0)
Immature Granulocytes: 0 %
Lymphocytes Relative: 30 %
Lymphs Abs: 1.3 10*3/uL (ref 0.7–4.0)
MCH: 30.7 pg (ref 26.0–34.0)
MCHC: 31.1 g/dL (ref 30.0–36.0)
MCV: 98.7 fL (ref 80.0–100.0)
Monocytes Absolute: 0.3 10*3/uL (ref 0.1–1.0)
Monocytes Relative: 8 %
Neutro Abs: 2.4 10*3/uL (ref 1.7–7.7)
Neutrophils Relative %: 57 %
Platelets: 202 10*3/uL (ref 150–400)
RBC: 3.94 MIL/uL (ref 3.87–5.11)
RDW: 13.3 % (ref 11.5–15.5)
WBC: 4.2 10*3/uL (ref 4.0–10.5)
nRBC: 0 % (ref 0.0–0.2)

## 2021-02-07 LAB — FERRITIN: Ferritin: 77 ng/mL (ref 11–307)

## 2021-02-07 LAB — IRON AND TIBC
Iron: 108 ug/dL (ref 28–170)
Saturation Ratios: 38 % — ABNORMAL HIGH (ref 10.4–31.8)
TIBC: 284 ug/dL (ref 250–450)
UIBC: 176 ug/dL

## 2021-02-07 LAB — VITAMIN B12: Vitamin B-12: 402 pg/mL (ref 180–914)

## 2021-02-14 ENCOUNTER — Inpatient Hospital Stay (HOSPITAL_BASED_OUTPATIENT_CLINIC_OR_DEPARTMENT_OTHER): Payer: Medicare Other | Admitting: Oncology

## 2021-02-14 ENCOUNTER — Other Ambulatory Visit: Payer: Self-pay

## 2021-02-14 ENCOUNTER — Encounter: Payer: Self-pay | Admitting: Oncology

## 2021-02-14 ENCOUNTER — Inpatient Hospital Stay: Payer: Medicare Other

## 2021-02-14 VITALS — BP 125/92 | HR 79 | Temp 97.8°F | Resp 16 | Wt 184.9 lb

## 2021-02-14 DIAGNOSIS — K9589 Other complications of other bariatric procedure: Secondary | ICD-10-CM | POA: Diagnosis not present

## 2021-02-14 DIAGNOSIS — D508 Other iron deficiency anemias: Secondary | ICD-10-CM

## 2021-02-14 DIAGNOSIS — D509 Iron deficiency anemia, unspecified: Secondary | ICD-10-CM | POA: Diagnosis not present

## 2021-02-14 DIAGNOSIS — E538 Deficiency of other specified B group vitamins: Secondary | ICD-10-CM

## 2021-02-15 ENCOUNTER — Encounter: Payer: Self-pay | Admitting: Internal Medicine

## 2021-02-15 NOTE — Progress Notes (Signed)
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  She has a history of iron and B12 deficiency anemia secondary to it.  She self administers B12 injections at home she has received both Feraheme and Venofer in the past.  Colonoscopy on 05/20/2016 revealed the sigmoid colon, descending colon and transverse colon were significantly redundant. There was some mild mucosal irritation at about 35-40 cm on withdrawal.  EGD on 10/12/2018 revealed a variable Z-line variable. There was LA Grade B erosive esophagitis. 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  There was bile gastritis. Roux-en-Y gastrojejunostomy with gastrojejunal anastomosis was characterized by an intact staple line and healthy appearing mucosa.     Interval history-patient reports doing well and denies any specific complaints at this time.  Denies any bleeding in her stool or urine  ECOG PS- 1 Pain scale- 0   Review of systems- Review of Systems  Constitutional: Negative for chills, fever, malaise/fatigue and weight loss.  HENT: Negative for congestion, ear discharge and nosebleeds.   Eyes: Negative for blurred vision.  Respiratory: Negative for cough, hemoptysis, sputum production, shortness of breath and wheezing.   Cardiovascular: Negative for chest pain, palpitations, orthopnea and claudication.  Gastrointestinal: Negative for  abdominal pain, blood in stool, constipation, diarrhea, heartburn, melena, nausea and vomiting.  Genitourinary: Negative for dysuria, flank pain, frequency, hematuria and urgency.  Musculoskeletal: Negative for back pain, joint pain and myalgias.  Skin: Negative for rash.  Neurological: Negative for dizziness, tingling, focal weakness, seizures, weakness and headaches.  Endo/Heme/Allergies: Does not bruise/bleed easily.  Psychiatric/Behavioral: Negative for depression and suicidal ideas. The patient does not have insomnia.       Allergies  Allergen Reactions  . Prednisone Palpitations    Will take if needed     Past Medical History:  Diagnosis Date  . Anemia   . Anxiety   . Arthritis    hands, fingers, lower back  . Complication of anesthesia    loss control of bowels after block for hip replacement  . Depression   . Gastritis   . GERD (gastroesophageal reflux disease)   . Headache    hx of migraines in past  . Hypertension   . IDA (iron deficiency anemia) 03/17/2015  . IDA (iron deficiency anemia) 03/17/2015  . Neck pain    s/p fall approx 1 month ago.  Transient. "Random"     Past Surgical History:  Procedure Laterality Date  . ABDOMINAL HYSTERECTOMY  2008  . ANTERIOR LATERAL LUMBAR FUSION WITH PERCUTANEOUS SCREW 1 LEVEL N/A 11/22/2020   Procedure: L3-4 LATERAL INTERBODY FUSION WITH PLATING;  Surgeon: Venetia Night, MD;  Location: [facility] ORS;  Service: Neurosurgery;  Laterality: N/A;  3rd case  . APPENDECTOMY    . BACK SURGERY  03/14/14   L4-5 laminectomy.  Dr Dutch Quint, [facility]  . CHOLECYSTECTOMY    . COLONOSCOPY    . COLONOSCOPY WITH PROPOFOL N/A 05/13/2016   Procedure: COLONOSCOPY WITH PROPOFOL;  Surgeon: [name],  MD;  Location: [facility] ENDOSCOPY;  Service: Endoscopy;  Laterality: N/A;  . COLONOSCOPY WITH PROPOFOL N/A 05/20/2016   Procedure: COLONOSCOPY WITH PROPOFOL;  Surgeon: Christena DeemMartin U Skulskie, MD;  [facility];  Service: Endoscopy;  Laterality: N/A;   . ESOPHAGOGASTRODUODENOSCOPY (EGD) WITH PROPOFOL N/A 05/13/2016   Procedure: ESOPHAGOGASTRODUODENOSCOPY (EGD) WITH PROPOFOL;  Surgeon: Christena DeemMartin U Skulskie, MD;  [facility];  Service: Endoscopy;  Laterality: N/A;  . ESOPHAGOGASTRODUODENOSCOPY (EGD) WITH PROPOFOL N/A 10/12/2018   Procedure: ESOPHAGOGASTRODUODENOSCOPY (EGD) WITH PROPOFOL;  Surgeon: Christena DeemSkulskie, Martin U, MD;  [facility];  Service: Endoscopy;  Laterality: N/A;  . GASTRIC BYPASS  2002  . JOINT REPLACEMENT Left 12/2013   [facility]  . PLANTAR FASCIA RELEASE Left 03/07/2016   Procedure: 1. Partial plantar fascial release with endoscopic procedure  2. Topaz fasciotomy percutaneously;  Surgeon: Recardo EvangelistMatthew Troxler, DPM;  [facility];  Service: Podiatry;  Laterality: Left;  LMA WITH POPLITEAL TOPAZ  . TONSILLECTOMY      Social History   Socioeconomic History  . Marital status: Married    Spouse name: Not on file  . Number of children: 1  . Years of education: 6712  . Highest education level: High school graduate  Occupational History  . Occupation: Disabled  Tobacco Use  . Smoking status: Never Smoker  . Smokeless tobacco: Never Used  Vaping Use  . Vaping Use: Never used  Substance and Sexual Activity  . Alcohol use: Not Currently  . Drug use: No  . Sexual activity: Yes  Other Topics Concern  . Not on file  Social History Narrative   Lives at home with husband.   Right-handed.   No caffeine use.   Social Determinants of Health   Financial Resource Strain: Not on file  Food Insecurity: Not on file  Transportation Needs: Not on file  Physical Activity: Not on file  Stress: Not on file  Social Connections: Not on file  Intimate Partner Violence: Not on file    Family History  Problem Relation Age of Onset  . Diabetes Mother   . Stroke Mother   . Heart attack Father        died at age 61  . Heart disease Father   . Macular degeneration Maternal Grandmother   .  Dementia Maternal Grandmother   . Prostate cancer Maternal Grandfather   . Heart attack Paternal Grandmother   . Heart disease Paternal Grandmother   . Heart attack Paternal Grandfather   . Heart disease Paternal Grandfather   . Breast cancer Neg Hx      Current Outpatient Medications:  .  Calcium Carbonate-Vitamin D 600-200 MG-UNIT TABS, Take by mouth., Disp: , Rfl:  .  Calcium Carbonate-Vitamin D 600-400 MG-UNIT tablet, Take 2 tablets by mouth 2 (two) times daily., Disp: , Rfl:  .  Coenzyme Q10 100 MG capsule, Take 100 mg by mouth daily., Disp: , Rfl:  .  cyanocobalamin (,VITAMIN B-12,) 1000 MCG/ML injection, INJECT 1 ML (1,000 MCG TOTAL) INTO THE MUSCLE EVERY 30 (THIRTY) DAYS., Disp: 3 mL, Rfl: 9 .  fexofenadine (ALLEGRA) 180 MG tablet, Take 180 mg by mouth daily as needed for allergies., Disp: , Rfl:  .  gabapentin (NEURONTIN) 600 MG tablet, Take 600 mg by mouth 2 (two) times daily., Disp: , Rfl:  .  losartan (COZAAR) 50 MG tablet, Take 50 mg by mouth daily., Disp: , Rfl:  .  meloxicam (MOBIC) 15 MG tablet, Take 1 tablet by mouth daily., Disp: ,  Rfl:  .  methocarbamol (ROBAXIN) 500 MG tablet, Take 1 tablet by mouth every 6 (six) hours as needed., Disp: , Rfl:  .  Multiple Vitamin (MULTIVITAMIN) tablet, Take 1 tablet by mouth daily., Disp: , Rfl:  .  Omega-3 Fatty Acids (FISH OIL) 1200 MG CAPS, Take 1,200 mg by mouth daily. , Disp: , Rfl:  .  pantoprazole (PROTONIX) 40 MG tablet, Take 40 mg by mouth 2 (two) times daily. , Disp: , Rfl: 2 .  polyethylene glycol-electrolytes (NULYTELY) 420 g solution, Use as directed for colon cleanse., Disp: , Rfl:  .  sertraline (ZOLOFT) 100 MG tablet, Take 1 tablet (100 mg total) by mouth daily., Disp: 90 tablet, Rfl: 1 .  sucralfate (CARAFATE) 1 g tablet, Take 1 g by mouth 2 (two) times daily. , Disp: , Rfl:  .  Vitamin D, Ergocalciferol, (DRISDOL) 50000 units CAPS capsule, Take 50,000 Units by mouth 2 (two) times a week., Disp: , Rfl: 3 .  aspirin 81  MG tablet, Take 81 mg by mouth daily. (Patient not taking: No sig reported), Disp: , Rfl:  .  busPIRone (BUSPAR) 5 MG tablet, Take 5 mg by mouth 2 (two) times daily.  (Patient not taking: No sig reported), Disp: , Rfl: 1 .  CREON 12000-38000 units CPEP capsule, Take by mouth. (Patient not taking: Reported on 02/14/2021), Disp: , Rfl:  .  escitalopram (LEXAPRO) 10 MG tablet, Take 10 mg by mouth daily. (Patient not taking: No sig reported), Disp: , Rfl:  .  HYDROcodone-acetaminophen (NORCO/VICODIN) 5-325 MG tablet, Take 1 tablet by mouth every 6 (six) hours as needed for severe pain. (Patient not taking: Reported on 02/14/2021), Disp: , Rfl:  .  hyoscyamine (LEVSIN SL) 0.125 MG SL tablet, Place under the tongue every 6 (six) hours as needed. (Patient not taking: Reported on 02/14/2021), Disp: , Rfl:  .  terbinafine (LAMISIL) 250 MG tablet, Take 250 mg by mouth daily. (Patient not taking: Reported on 02/14/2021), Disp: , Rfl:  .  traZODone (DESYREL) 100 MG tablet, Take 100-200 mg by mouth at bedtime as needed for sleep. (Patient not taking: Reported on 02/14/2021), Disp: , Rfl:   Physical exam:  Vitals:   02/14/21 1311  BP: (!) 125/92  Pulse: 79  Resp: 16  Temp: 97.8 F (36.6 C)  SpO2: 99%  Weight: 184 lb 13.7 oz (83.8 kg)   Physical Exam HENT:     Head: Normocephalic and atraumatic.  Eyes:     Pupils: Pupils are equal, round, and reactive to light.  Cardiovascular:     Rate and Rhythm: Normal rate and regular rhythm.     Heart sounds: Normal heart sounds.  Pulmonary:     Effort: Pulmonary effort is normal.     Breath sounds: Normal breath sounds.  Abdominal:     General: Bowel sounds are normal.     Palpations: Abdomen is soft.  Musculoskeletal:     Cervical back: Normal range of motion.  Skin:    General: Skin is warm and dry.  Neurological:     Mental Status: She is alert and oriented to person, place, and time.      CMP Latest Ref Rng & Units 10/30/2020  Glucose 70 - 99 mg/dL  384(Y)  BUN 6 - 20 mg/dL 19  Creatinine 6.59 - 9.35 mg/dL 7.01  Sodium 779 - 390 mmol/L 140  Potassium 3.5 - 5.1 mmol/L 4.4  Chloride 98 - 111 mmol/L 103  CO2 22 - 32 mmol/L 25  Calcium 8.9 - 10.3 mg/dL 8.9  Total Protein 6.5 - 8.1 g/dL 7.0  Total Bilirubin 0.3 - 1.2 mg/dL 0.9  Alkaline Phos 38 - 126 U/L 68  AST 15 - 41 U/L 22  ALT 0 - 44 U/L 22   CBC Latest Ref Rng & Units 02/07/2021  WBC 4.0 - 10.5 K/uL 4.2  Hemoglobin 12.0 - 15.0 g/dL 32.9  Hematocrit 92.4 - 46.0 % 38.9  Platelets 150 - 400 K/uL 202    No images are attached to the encounter.  MM 3D SCREEN BREAST BILATERAL  Result Date: 01/22/2021 CLINICAL DATA:  Screening. EXAM: DIGITAL SCREENING BILATERAL MAMMOGRAM WITH TOMOSYNTHESIS AND CAD TECHNIQUE: Bilateral screening digital craniocaudal and mediolateral oblique mammograms were obtained. Bilateral screening digital breast tomosynthesis was performed. The images were evaluated with computer-aided detection. COMPARISON:  Previous exam(s). ACR Breast Density Category b: There are scattered areas of fibroglandular density. FINDINGS: There are no findings suspicious for malignancy. The images were evaluated with computer-aided detection. IMPRESSION: No mammographic evidence of malignancy. A result letter of this screening mammogram will be mailed directly to the patient. RECOMMENDATION: Screening mammogram in one year. (Code:SM-B-01Y) BI-RADS CATEGORY  1: Negative. Electronically Signed   By: Sherian Rein M.D.   On: 01/22/2021 13:06     Assessment and plan- Patient is a 60 y.o. 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  B12 levels are presently normal.  Iron deficiency anemia: Patient does not currently anemic with a hemoglobin that has remained stable around 12.  Iron studies are normal and ferritin 77 therefore she does not require any IV iron at this time.   Given the stability of her labs repeat CBC ferritin and iron studies and B12 in 4 and 8 months and I will see her back in 8 months   Visit Diagnosis 1. Iron deficiency anemia following bariatric surgery   2. B12 deficiency      Dr. Owens Shark, MD, MPH [facility] at [facility] [phone] 02/15/2021 9:45 AM

## 2021-02-20 ENCOUNTER — Other Ambulatory Visit: Payer: Self-pay | Admitting: *Deleted

## 2021-02-20 DIAGNOSIS — E538 Deficiency of other specified B group vitamins: Secondary | ICD-10-CM

## 2021-02-21 ENCOUNTER — Encounter: Payer: Self-pay | Admitting: Internal Medicine

## 2021-02-21 MED ORDER — CYANOCOBALAMIN 1000 MCG/ML IJ SOLN: 1000.0000 ug | INTRAMUSCULAR | 9 refills | Status: DC

## 2021-02-24 ENCOUNTER — Ambulatory Visit (INDEPENDENT_AMBULATORY_CARE_PROVIDER_SITE_OTHER): Payer: Medicare Other

## 2021-02-24 ENCOUNTER — Ambulatory Visit
Admission: EM | Admit: 2021-02-24 | Discharge: 2021-02-24 | Disposition: A | Discharge status: Home / Self Care | Payer: Medicare Other | Attending: Emergency Medicine | Admitting: Emergency Medicine

## 2021-02-24 ENCOUNTER — Encounter: Payer: Self-pay | Admitting: Emergency Medicine

## 2021-02-24 ENCOUNTER — Other Ambulatory Visit: Payer: Self-pay

## 2021-02-24 DIAGNOSIS — R0789 Other chest pain: Secondary | ICD-10-CM

## 2021-02-24 DIAGNOSIS — R079 Chest pain, unspecified: Secondary | ICD-10-CM | POA: Diagnosis not present

## 2021-02-24 DIAGNOSIS — M25562 Pain in left knee: Secondary | ICD-10-CM

## 2021-02-24 MED ORDER — HYDROCODONE-ACETAMINOPHEN 5-325 MG PO TABS: 1.0000 | ORAL_TABLET | Freq: Four times a day (QID) | ORAL | 0 refills | Status: DC | PRN
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
## 2021-02-24 NOTE — Discharge Instructions (Addendum)
Your x-rays today did not demonstrate any broken bones.  You do have some soft tissue swelling in your knee and some of the pain may be result of nerve inflammation so which you to please continue your gabapentin.  Use the Norco as needed for severe pain.  Keep your left knee elevated is much as possible to help deal with some of the swelling and pain relief.  You can also apply moist heat to your left knee and your chest wall for 20 minutes at a time 2-3 times a day.  The moist heat will increase blood flow which will help aid in healing.
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
## 2021-06-20 ENCOUNTER — Other Ambulatory Visit: Payer: Medicare Other

## 2021-07-11 ENCOUNTER — Other Ambulatory Visit: Payer: Self-pay

## 2021-07-11 ENCOUNTER — Ambulatory Visit
Admission: EM | Admit: 2021-07-11 | Discharge: 2021-07-11 | Disposition: A | Discharge status: Home / Self Care | Payer: Medicare Other | Attending: Medical Oncology | Admitting: Medical Oncology

## 2021-07-11 ENCOUNTER — Encounter: Payer: Self-pay | Admitting: Licensed Clinical Social Worker

## 2021-07-11 DIAGNOSIS — R42 Dizziness and giddiness: Secondary | ICD-10-CM | POA: Insufficient documentation

## 2021-07-11 DIAGNOSIS — R9431 Abnormal electrocardiogram [ECG] [EKG]: Secondary | ICD-10-CM | POA: Diagnosis present

## 2021-07-11 LAB — BASIC METABOLIC PANEL
Anion gap: 4 — ABNORMAL LOW (ref 5–15)
BUN: 16 mg/dL (ref 8–23)
CO2: 27 mmol/L (ref 22–32)
Calcium: 8.5 mg/dL — ABNORMAL LOW (ref 8.9–10.3)
Chloride: 106 mmol/L (ref 98–111)
Creatinine, Ser: 0.47 mg/dL (ref 0.44–1.00)
GFR, Estimated: >60 mL/min (ref >60)
Glucose, Bld: 99 mg/dL (ref 70–99)
Potassium: 3.8 mmol/L (ref 3.5–5.1)
Sodium: 137 mmol/L (ref 135–145)

## 2021-07-11 LAB — TROPONIN I (HIGH SENSITIVITY): Troponin I (High Sensitivity): 3 ng/L (ref <18)

## 2021-07-11 LAB — CBC WITH DIFFERENTIAL/PLATELET
Abs Immature Granulocytes: 0.01 10*3/uL (ref 0.00–0.07)
Basophils Absolute: 0 10*3/uL (ref 0.0–0.1)
Basophils Relative: 1 %
Eosinophils Absolute: 0.2 10*3/uL (ref 0.0–0.5)
Eosinophils Relative: 3 %
HCT: 38.5 % (ref 36.0–46.0)
Hemoglobin: 12.2 g/dL (ref 12.0–15.0)
Immature Granulocytes: 0 %
Lymphocytes Relative: 28 %
Lymphs Abs: 1.3 10*3/uL (ref 0.7–4.0)
MCH: 30.9 pg (ref 26.0–34.0)
MCHC: 31.7 g/dL (ref 30.0–36.0)
MCV: 97.5 fL (ref 80.0–100.0)
Monocytes Absolute: 0.4 10*3/uL (ref 0.1–1.0)
Monocytes Relative: 9 %
Neutro Abs: 2.7 10*3/uL (ref 1.7–7.7)
Neutrophils Relative %: 59 %
Platelets: 187 10*3/uL (ref 150–400)
RBC: 3.95 MIL/uL (ref 3.87–5.11)
RDW: 12.9 % (ref 11.5–15.5)
WBC: 4.5 10*3/uL (ref 4.0–10.5)
nRBC: 0 % (ref 0.0–0.2)

## 2021-07-11 NOTE — ED Provider Notes (Signed)
[facility]    CSN: [id number] Arrival date & time: 07/11/21  4098      History   Chief Complaint Chief Complaint  Patient presents with   Dizziness    HPI [name] is a 60 y.o. female.   HPI  Dizziness: Patient reports that she has had dizziness since last night.  No known head injury.  She states that she has vertigo but this episode felt a bit different as her head felt heavy.  She reports that other than this dizziness, mild nausea without vomiting and heavy sensation of her head she did not have any other symptoms such as chest pain, palpitations or shortness of breath.  This sensation has continued and is still present but has not worsened or improved.  She has not taken anything for symptoms.  She does have a history of anemia and electrolyte deficiencies.  Of note she did start taking Alpha Lipoic Acid 1 week ago.   Past Medical History:  Diagnosis Date   Anemia    Anxiety    Arthritis    hands, fingers, lower back   Complication of anesthesia    loss control of bowels after block for hip replacement   Depression    Gastritis    GERD (gastroesophageal reflux disease)    Headache    hx of migraines in past   Hypertension    IDA (iron deficiency anemia) 03/17/2015   IDA (iron deficiency anemia) 03/17/2015   Neck pain    s/p fall approx 1 month ago.  Transient. 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  Dr Dutch Quint, [facility]   CHOLECYSTECTOMY     COLONOSCOPY     COLONOSCOPY WITH PROPOFOL N/A 05/13/2016   Procedure: COLONOSCOPY WITH PROPOFOL;  Surgeon: [name], MD;  Location: [facility] ENDOSCOPY;  Service: Endoscopy;  Laterality: N/A;   COLONOSCOPY WITH PROPOFOL N/A 05/20/2016   Procedure: COLONOSCOPY WITH PROPOFOL;  Surgeon: [name], MD;  Location: [facility] ENDOSCOPY;  Service: Endoscopy;  Laterality: N/A;   ESOPHAGOGASTRODUODENOSCOPY (EGD) WITH PROPOFOL N/A 05/13/2016   Procedure: ESOPHAGOGASTRODUODENOSCOPY (EGD) WITH PROPOFOL;  Surgeon: [name], MD;  Location: [facility] ENDOSCOPY;  Service: Endoscopy;  Laterality: N/A;   ESOPHAGOGASTRODUODENOSCOPY (EGD) WITH PROPOFOL N/A 10/12/2018   Procedure: ESOPHAGOGASTRODUODENOSCOPY (EGD) WITH PROPOFOL;  Surgeon: [name], MD;  Location: [facility] ENDOSCOPY;  Service: Endoscopy;  Laterality: N/A;   GASTRIC BYPASS  2002   JOINT REPLACEMENT Left 12/2013   hip replacement at [facility]   PLANTAR FASCIA RELEASE Left 03/07/2016   Procedure: 1. Partial plantar fascial release with endoscopic procedure   2. Topaz fasciotomy percutaneously;  Surgeon: Recardo Evangelist, DPM;  Location: [facility];  Service: Podiatry;  Laterality:  Left;  LMA WITH POPLITEAL TOPAZ   TONSILLECTOMY      OB History   No obstetric history on file.      Home Medications    Prior to Admission medications   Medication Sig Start Date End Date Taking? Authorizing Provider  aspirin 81 MG tablet Take 81 mg by mouth daily.   Yes [provider]  Calcium Carbonate-Vitamin D 600-400 MG-UNIT tablet Take 2 tablets by mouth 2 (two) times daily.   Yes [provider]  Coenzyme Q10 100 MG capsule Take 100 mg by mouth  daily.   Yes [provider]  cyanocobalamin (,VITAMIN B-12,) 1000 MCG/ML injection Inject 1 mL (1,000 mcg total) into the muscle every 30 (thirty) days. 02/21/21  Yes Borders, Daryl Eastern, NP  fexofenadine (ALLEGRA) 180 MG tablet Take 180 mg by mouth daily as needed for allergies.   Yes [provider]  gabapentin (NEURONTIN) 600 MG tablet Take 600 mg by mouth 2 (two) times daily.   Yes [provider]  losartan (COZAAR) 50 MG tablet Take 50 mg by mouth daily. 11/20/16  Yes [provider]  meloxicam (MOBIC) 15 MG tablet Take 1 tablet by mouth daily. 12/26/20  Yes [provider]  methocarbamol (ROBAXIN) 500 MG tablet Take 1 tablet by mouth every 6 (six) hours as needed. 01/01/21  Yes [provider]  Multiple Vitamin (MULTIVITAMIN) tablet Take 1 tablet by mouth daily.   Yes [provider]  Omega-3 Fatty Acids (FISH OIL) 1200 MG CAPS Take 1,200 mg by mouth daily.    Yes [provider]  pantoprazole (PROTONIX) 40 MG tablet Take 40 mg by mouth 2 (two) times daily.  02/27/18  Yes [provider]  polyethylene glycol-electrolytes (NULYTELY) 420 g solution Use as directed for colon cleanse. 12/26/20  Yes [provider]  sertraline (ZOLOFT) 100 MG tablet Take 1 tablet (100 mg total) by mouth daily. 04/11/18  Yes Cook, Jayce G, DO  sucralfate (CARAFATE) 1 g tablet Take 1 g by mouth 2 (two) times daily.    Yes [provider]  Vitamin D, Ergocalciferol, (DRISDOL) 50000 units CAPS capsule Take 50,000 Units by mouth 2 (two) times a week. 05/30/18  Yes [provider]  HYDROcodone-acetaminophen (NORCO/VICODIN) 5-325 MG tablet Take 1-2 tablets by mouth every 6 (six) hours as needed. 02/24/21   Becky Augusta, NP  hyoscyamine (LEVSIN SL) 0.125 MG SL tablet Place under the tongue every 6 (six) hours as needed. Patient not taking: No sig reported 12/26/20   [provider]  terbinafine (LAMISIL) 250 MG tablet Take  250 mg by mouth daily. Patient not taking: No sig reported 01/04/21   [provider]  traZODone (DESYREL) 100 MG tablet Take 100-200 mg by mouth at bedtime as needed for sleep. Patient not taking: No sig reported    [provider]  escitalopram (LEXAPRO) 10 MG tablet Take 10 mg by mouth daily.  02/24/21  [provider]    Family History Family History  Problem Relation Age of Onset   Diabetes Mother    Stroke Mother    Heart attack Father        died at age 83   Heart disease Father    Macular degeneration Maternal Grandmother    Dementia Maternal Grandmother    Prostate cancer Maternal Grandfather    Heart attack Paternal Grandmother    Heart disease Paternal Grandmother    Heart attack Paternal Grandfather    Heart disease Paternal  Grandfather    Breast cancer Neg Hx     Social History Social History   Tobacco Use   Smoking status: Never   Smokeless tobacco: Never  Vaping Use   Vaping Use: Never used  Substance Use Topics   Alcohol use: Not Currently   Drug use: No     Allergies   Prednisone   Review of Systems Review of Systems  As stated above in HPI Physical Exam Triage Vital Signs ED Triage Vitals  Enc Vitals Group     BP 07/11/21 0841 140/89     Pulse Rate 07/11/21 0841 60     Resp 07/11/21 0841 16     Temp 07/11/21 0841 98.5 F (36.9 C)     Temp Source 07/11/21 0841 Oral     SpO2 07/11/21 0841 90 %     Weight 07/11/21 0838 180 lb (81.6 kg)     Height 07/11/21 0838 5\' 3"  (1.6 m)     Head Circumference --      Peak Flow --      Pain Score 07/11/21 0836 0     Pain Loc --      Pain Edu? --      Excl. in [place]? --    No data found.  Updated Vital Signs BP 140/89 (BP Location: Left Arm)   Pulse 60   Temp 98.5 F (36.9 C) (Oral)   Resp 16   Ht 5\' 3"  (1.6 m)   Wt 180 lb (81.6 kg)   SpO2 99%   BMI 31.89 kg/m   Physical Exam Vitals and nursing note reviewed.  Constitutional:      General: She is not in acute  distress.    Appearance: Normal appearance. She is not ill-appearing, toxic-appearing or diaphoretic.     Comments: Laying on examination table but is able to complete activities and assessment easily  HENT:     Head: Normocephalic and atraumatic.     Nose: Nose normal.     Mouth/Throat:     Mouth: Mucous membranes are moist.  Eyes:     General: No scleral icterus.    Extraocular Movements: Extraocular movements intact.     Pupils: Pupils are equal, round, and reactive to light.     Comments: Mild pallor of conjunctiva  Neck:     Vascular: No carotid bruit.  Cardiovascular:     Rate and Rhythm: Normal rate and regular rhythm.     Pulses: Normal pulses.     Heart sounds: Normal heart sounds.  Pulmonary:     Effort: Pulmonary effort is normal.     Breath sounds: Normal breath sounds.  Musculoskeletal:     Cervical back: Normal range of motion and neck supple. No rigidity or tenderness.  Lymphadenopathy:     Cervical: No cervical adenopathy.  Skin:    General: Skin is warm.     Capillary Refill: Capillary refill takes 2 to 3 seconds.     Coloration: Skin is pale (mild).  Neurological:     General: No focal deficit present.     Mental Status: She is alert and oriented to person, place, and time.     Cranial Nerves: No cranial nerve deficit.     Sensory: No sensory deficit.     Motor: No weakness.     Coordination: Coordination normal.     Gait: Gait normal.     Deep Tendon Reflexes: Reflexes normal.  Psychiatric:        Mood and  Affect: Mood normal.        Behavior: Behavior normal.     UC Treatments / Results  Labs (all labs ordered are listed, but only abnormal results are displayed) Labs Reviewed  CBC WITH DIFFERENTIAL/PLATELET  BASIC METABOLIC PANEL  TROPONIN I (HIGH SENSITIVITY)    EKG   Radiology No results found.  Procedures Procedures (including critical care time)  Medications Ordered in UC Medications - No data to display  Initial Impression /  Assessment and Plan / UC Course  I have reviewed the triage vital signs and the nursing notes.  Pertinent labs & imaging results that were available during my care of the patient were reviewed by me and considered in my medical decision making (see chart for details).  Clinical Course as of 07/11/21 1032  Wed Jul 11, 2021  1030 Troponin I (High Sensitivity): 3 [[place]]  1030 Troponin I (High Sensitivity) [[place]]    Clinical Course User Index [[place]] Rushie Chestnut, [place]    New. Wide differential including anemia, electrolyte abnormality, ACS, vertigo, medication side effect. Labs and EKG. Discussed with patient. Her EKG shows some P wave inversions compared to her last EKG (personally compared) in the anterior lead. Given this will add troponin onto lab orders as she will only need one set as symptoms started last night.   UPDATE: Troponin is negative. Calcium is slightly low so she will increase intake. Likely symptoms are related to her new supplement which I have advised her to stop and to follow up with her PCP within 7 days. ER should symptoms worsen.  Final Clinical Impressions(s) / UC Diagnoses   Final diagnoses:  None   Discharge Instructions   None    ED Prescriptions   None    PDMP not reviewed this encounter.   Rushie Chestnut, [place] 07/11/21 1038

## 2021-07-11 NOTE — ED Triage Notes (Signed)
Pt here W/O lightheaded since last night, felt like head weighed 50lbs pounds, nausea, has had vertigo in the past. Started taking Alpha Lipoic Acid a couple weeks ago, not sure if this is a side effect. Has not taken any medication this morning

## 2021-07-11 NOTE — Discharge Instructions (Addendum)
Please stop your new supplement

## 2021-08-03 ENCOUNTER — Other Ambulatory Visit: Payer: Self-pay

## 2021-08-03 ENCOUNTER — Encounter: Payer: Self-pay | Admitting: Emergency Medicine

## 2021-08-03 ENCOUNTER — Ambulatory Visit
Admission: EM | Admit: 2021-08-03 | Discharge: 2021-08-03 | Disposition: A | Discharge status: Home / Self Care | Payer: Medicare Other | Attending: Emergency Medicine | Admitting: Emergency Medicine

## 2021-08-03 DIAGNOSIS — H6502 Acute serous otitis media, left ear: Secondary | ICD-10-CM | POA: Diagnosis not present

## 2021-08-03 DIAGNOSIS — J069 Acute upper respiratory infection, unspecified: Secondary | ICD-10-CM

## 2021-08-03 DIAGNOSIS — R051 Acute cough: Secondary | ICD-10-CM

## 2021-08-03 MED ORDER — PREDNISONE 5 MG PO TABS: 5.0000 mg | ORAL_TABLET | Freq: Every day | ORAL | 0 refills | Status: DC

## 2021-08-03 MED ORDER — AMOXICILLIN-POT CLAVULANATE 875-125 MG PO TABS
1.0000 | ORAL_TABLET | Freq: Two times a day (BID) | ORAL | 0 refills | Status: DC
Start: 2021-08-03 — End: 2022-02-08

## 2021-08-03 NOTE — ED Provider Notes (Signed)
[facility]    CSN: [id number] Arrival date & time: 08/03/21  1139      History   Chief Complaint Chief Complaint  Patient presents with   Cough   Otalgia    HPI [name] is a 60 y.o. female.   Left ear pain for 3 days, cough ,congestion and facial pain. States that she just does not feel well at all. Unknown fever. No chest pain, no sob. Has taken some oct meds and Hammond quil with no relief.    Past Medical History:  Diagnosis Date   Anemia    Anxiety    Arthritis    hands, fingers, lower back   Complication of anesthesia    loss control of bowels after block for hip replacement   Depression    Gastritis    GERD (gastroesophageal reflux disease)    Headache    hx of migraines in past   Hypertension    IDA (iron deficiency anemia) 03/17/2015   IDA (iron deficiency anemia) 03/17/2015   Neck pain    s/p fall approx 1 month ago.  Transient. 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  Dr Trenton Gammon, [facility]   CHOLECYSTECTOMY     COLONOSCOPY     COLONOSCOPY WITH PROPOFOL N/A 05/13/2016   Procedure: COLONOSCOPY WITH PROPOFOL;  Surgeon: Lollie Sails, MD;  Location: [facility]  ENDOSCOPY;  Service: Endoscopy;  Laterality: N/A;   COLONOSCOPY WITH PROPOFOL N/A 05/20/2016   Procedure: COLONOSCOPY WITH PROPOFOL;  Surgeon: Lollie Sails, MD;  Location: [facility] ENDOSCOPY;  Service: Endoscopy;  Laterality: N/A;   ESOPHAGOGASTRODUODENOSCOPY (EGD) WITH PROPOFOL N/A 05/13/2016   Procedure: ESOPHAGOGASTRODUODENOSCOPY (EGD) WITH PROPOFOL;  Surgeon: Lollie Sails, MD;  Location: [facility] ENDOSCOPY;  Service: Endoscopy;  Laterality: N/A;   ESOPHAGOGASTRODUODENOSCOPY (EGD) WITH PROPOFOL N/A 10/12/2018   Procedure: ESOPHAGOGASTRODUODENOSCOPY (EGD) WITH PROPOFOL;  Surgeon: Lollie Sails, MD;  Location: [facility] ENDOSCOPY;  Service: Endoscopy;  Laterality: N/A;   GASTRIC BYPASS  2002   JOINT REPLACEMENT Left 12/2013   hip replacement at [place] Left 03/07/2016   Procedure: 1. Partial plantar fascial release with endoscopic procedure   2. Topaz fasciotomy percutaneously;  Surgeon: Albertine Patricia, DPM;  Location: [place];  Service: Podiatry;  Laterality: Left;  LMA WITH POPLITEAL TOPAZ   TONSILLECTOMY      OB History   No obstetric history on file.      Home Medications    Prior to Admission medications   Medication Sig Start Date End Date Taking? Authorizing Provider  amoxicillin-clavulanate (AUGMENTIN) 875-125 MG tablet Take 1 tablet by mouth every 12 (twelve) hours. 08/03/21  Yes Marney Setting, NP  predniSONE (DELTASONE) 5 MG  tablet Take 1 tablet (5 mg total) by mouth daily with breakfast. 08/03/21  Yes Coralyn Mark, NP  aspirin 81 MG tablet Take 81 mg by mouth daily.    [provider]  Calcium Carbonate-Vitamin D 600-400 MG-UNIT tablet Take 2 tablets by mouth 2 (two) times daily.    [provider]  Coenzyme Q10 100 MG capsule Take 100 mg by mouth daily.    [provider]  cyanocobalamin (,VITAMIN B-12,) 1000 MCG/ML injection Inject 1 mL (1,000 mcg total) into the muscle every 30 (thirty) days. 02/21/21    Borders, Daryl Eastern, NP  fexofenadine (ALLEGRA) 180 MG tablet Take 180 mg by mouth daily as needed for allergies.    [provider]  gabapentin (NEURONTIN) 600 MG tablet Take 600 mg by mouth 2 (two) times daily.    [provider]  HYDROcodone-acetaminophen (NORCO/VICODIN) 5-325 MG tablet Take 1-2 tablets by mouth every 6 (six) hours as needed. 02/24/21   Becky Augusta, NP  hyoscyamine (LEVSIN SL) 0.125 MG SL tablet Place under the tongue every 6 (six) hours as needed. Patient not taking: No sig reported 12/26/20   [provider]  losartan (COZAAR) 50 MG tablet Take 50 mg by mouth daily. 11/20/16   [provider]  meloxicam (MOBIC) 15 MG tablet Take 1 tablet by mouth daily. 12/26/20   [provider]  methocarbamol (ROBAXIN) 500 MG tablet Take 1 tablet by mouth every 6 (six) hours as needed. 01/01/21   [provider]  Multiple Vitamin (MULTIVITAMIN) tablet Take 1 tablet by mouth daily.    [provider]  Omega-3 Fatty Acids (FISH OIL) 1200 MG CAPS Take 1,200 mg by mouth daily.     [provider]  pantoprazole (PROTONIX) 40 MG tablet Take 40 mg by mouth 2 (two) times daily.  02/27/18   [provider]  polyethylene glycol-electrolytes (NULYTELY) 420 g solution Use as directed for colon cleanse. 12/26/20   [provider]  sertraline (ZOLOFT) 100 MG tablet Take 1 tablet (100 mg total) by mouth daily. 04/11/18   Tommie Sams, DO  sucralfate (CARAFATE) 1 g tablet Take 1 g by mouth 2 (two) times daily.     [provider]  terbinafine (LAMISIL) 250 MG tablet Take 250 mg by mouth daily. Patient not taking: No sig reported 01/04/21   [provider]  traZODone (DESYREL) 100 MG tablet Take 100-200 mg by mouth at bedtime as needed for sleep. Patient not taking: No sig reported    [provider]  Vitamin D, Ergocalciferol, (DRISDOL) 50000 units CAPS capsule Take 50,000 Units by mouth 2 (two) times a  week. 05/30/18   [provider]  escitalopram (LEXAPRO) 10 MG tablet Take 10 mg by mouth daily.  02/24/21  [provider]    Family History Family History  Problem Relation Age of Onset   Diabetes Mother    Stroke Mother    Heart attack Father        died at age 44   Heart disease Father    Macular degeneration Maternal Grandmother    Dementia Maternal Grandmother    Prostate cancer Maternal Grandfather    Heart attack Paternal Grandmother    Heart disease Paternal Grandmother    Heart attack Paternal Grandfather    Heart disease Paternal Grandfather    Breast cancer Neg Hx     Social History Social History   Tobacco Use   Smoking status: Never   Smokeless tobacco:  Never  Vaping Use   Vaping Use: Never used  Substance Use Topics   Alcohol use: Not Currently   Drug use: No     Allergies   Prednisone   Review of Systems Review of Systems  Constitutional:  Positive for chills and fatigue. Negative for fever.  HENT:  Positive for congestion, ear pain, postnasal drip, rhinorrhea, sinus pressure and sinus pain. Negative for ear discharge.   Eyes: Negative.   Respiratory:  Positive for cough. Negative for shortness of breath.   Cardiovascular: Negative.   Gastrointestinal: Negative.   Genitourinary: Negative.   Skin: Negative.   Neurological: Negative.     Physical Exam Triage Vital Signs ED Triage Vitals  Enc Vitals Group     BP 08/03/21 1219 (!) 126/97     Pulse Rate 08/03/21 1219 82     Resp 08/03/21 1219 14     Temp 08/03/21 1219 98.5 F (36.9 C)     Temp Source 08/03/21 1219 Oral     SpO2 08/03/21 1219 97 %     Weight 08/03/21 1216 185 lb (83.9 kg)     Height 08/03/21 1216 5\' 3"  (1.6 m)     Head Circumference --      Peak Flow --      Pain Score 08/03/21 1216 8     Pain Loc --      Pain Edu? --      Excl. in [place]? --    No data found.  Updated Vital Signs BP (!) 126/97 (BP Location: Right Arm)   Pulse 82   Temp 98.5 F (36.9  C) (Oral)   Resp 14   Ht 5\' 3"  (1.6 m)   Wt 185 lb (83.9 kg)   SpO2 97%   BMI 32.77 kg/m   Visual Acuity Right Eye Distance:   Left Eye Distance:   Bilateral Distance:    Right Eye Near:   Left Eye Near:    Bilateral Near:     Physical Exam Constitutional:      Appearance: She is ill-appearing.  HENT:     Right Ear: Tympanic membrane is bulging. Tympanic membrane is not erythematous.     Left Ear: Swelling and tenderness present. A middle ear effusion is present. Tympanic membrane is erythematous and bulging.     Nose: Congestion and rhinorrhea present.     Mouth/Throat:     Pharynx: Posterior oropharyngeal erythema present.  Eyes:     Pupils: Pupils are equal, round, and reactive to light.  Cardiovascular:     Rate and Rhythm: Normal rate.  Pulmonary:     Effort: Pulmonary effort is normal.  Abdominal:     General: Abdomen is flat.  Musculoskeletal:     Cervical back: Normal range of motion.  Neurological:     Mental Status: She is alert.     UC Treatments / Results  Labs (all labs ordered are listed, but only abnormal results are displayed) Labs Reviewed - No data to display  EKG   Radiology No results found.  Procedures Procedures (including critical care time)  Medications Ordered in UC Medications - No data to display  Initial Impression / Assessment and Plan / UC Course  I have reviewed the triage vital signs and the nursing notes.  Pertinent labs & imaging results that were available during my care of the patient were reviewed by me and considered in my medical decision making (see chart for details).     Take tylenol as  needed for pain  Cautious with taking steroids if you have any chest pain or palpations stop taking pt states that she is ok with taking low dose of steroids  Take full dose of abx with foods  Final Clinical Impressions(s) / UC Diagnoses   Final diagnoses:  Acute cough  Viral URI with cough  Non-recurrent acute serous  otitis media of left ear     Discharge Instructions      Take tylenol as needed for pain  Cautious with taking steroids if you have any chest pain or palpations stop t  Take full dose of abx with foods     ED Prescriptions     Medication Sig Dispense Auth. Provider   amoxicillin-clavulanate (AUGMENTIN) 875-125 MG tablet Take 1 tablet by mouth every 12 (twelve) hours. 14 tablet Morley Kos L, NP   predniSONE (DELTASONE) 5 MG tablet Take 1 tablet (5 mg total) by mouth daily with breakfast. 10 tablet Marney Setting, NP      PDMP not reviewed this encounter.   Marney Setting, NP 08/03/21 1310

## 2021-08-03 NOTE — ED Triage Notes (Signed)
Patient c/o cough, congestion, left sided facial pressure and left ear pain that started a week ago.  Patient denies fevers.

## 2021-08-03 NOTE — Discharge Instructions (Addendum)
Take tylenol as needed for pain  Cautious with taking steroids if you have any chest pain or palpations stop t  Take full dose of abx with foods

## 2021-08-16 IMAGING — RF DG C-ARM 1-60 MIN
1 series · 2 of 2 positions shown · non-contrast
Comparison: April 17, 2020.

CLINICAL DATA: Surgical posterior fusion of lumbar spine.

EXAM:
LUMBAR SPINE - 2-3 VIEW; DG C-ARM 1-60 MIN
Radiation exposure index: 22.556 mGy.

[Series 1: dg x-ray · 0.20mm/px · 2 of 2 slices shown]
[im 1/2]
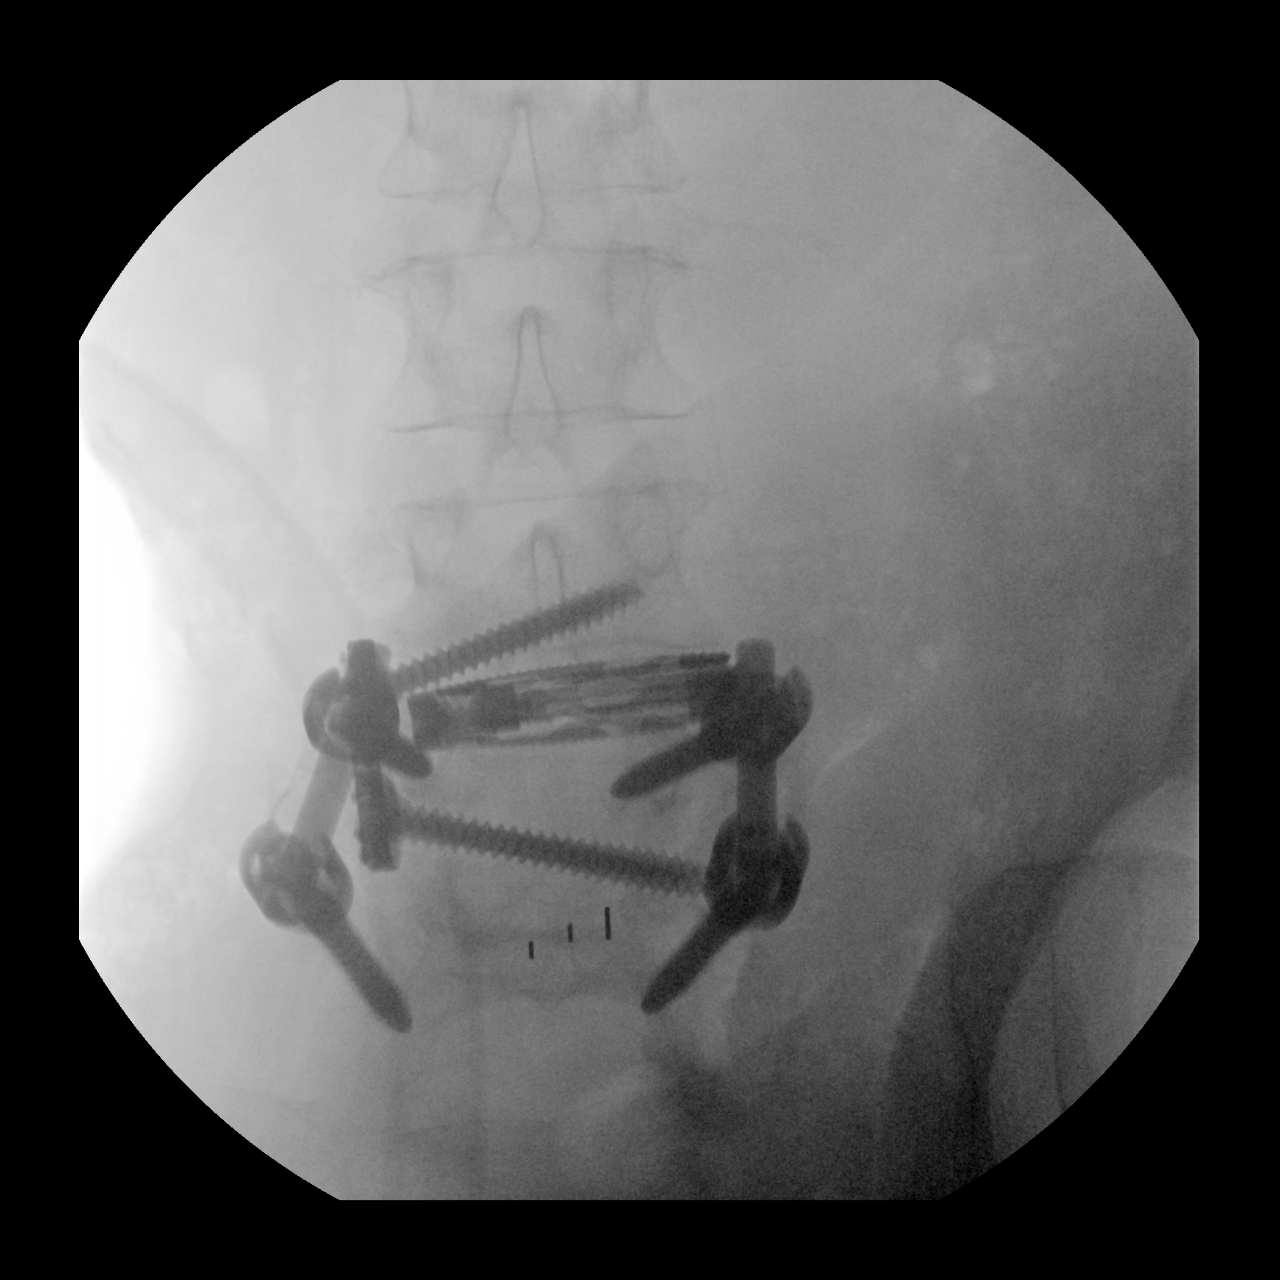
[im 2/2]
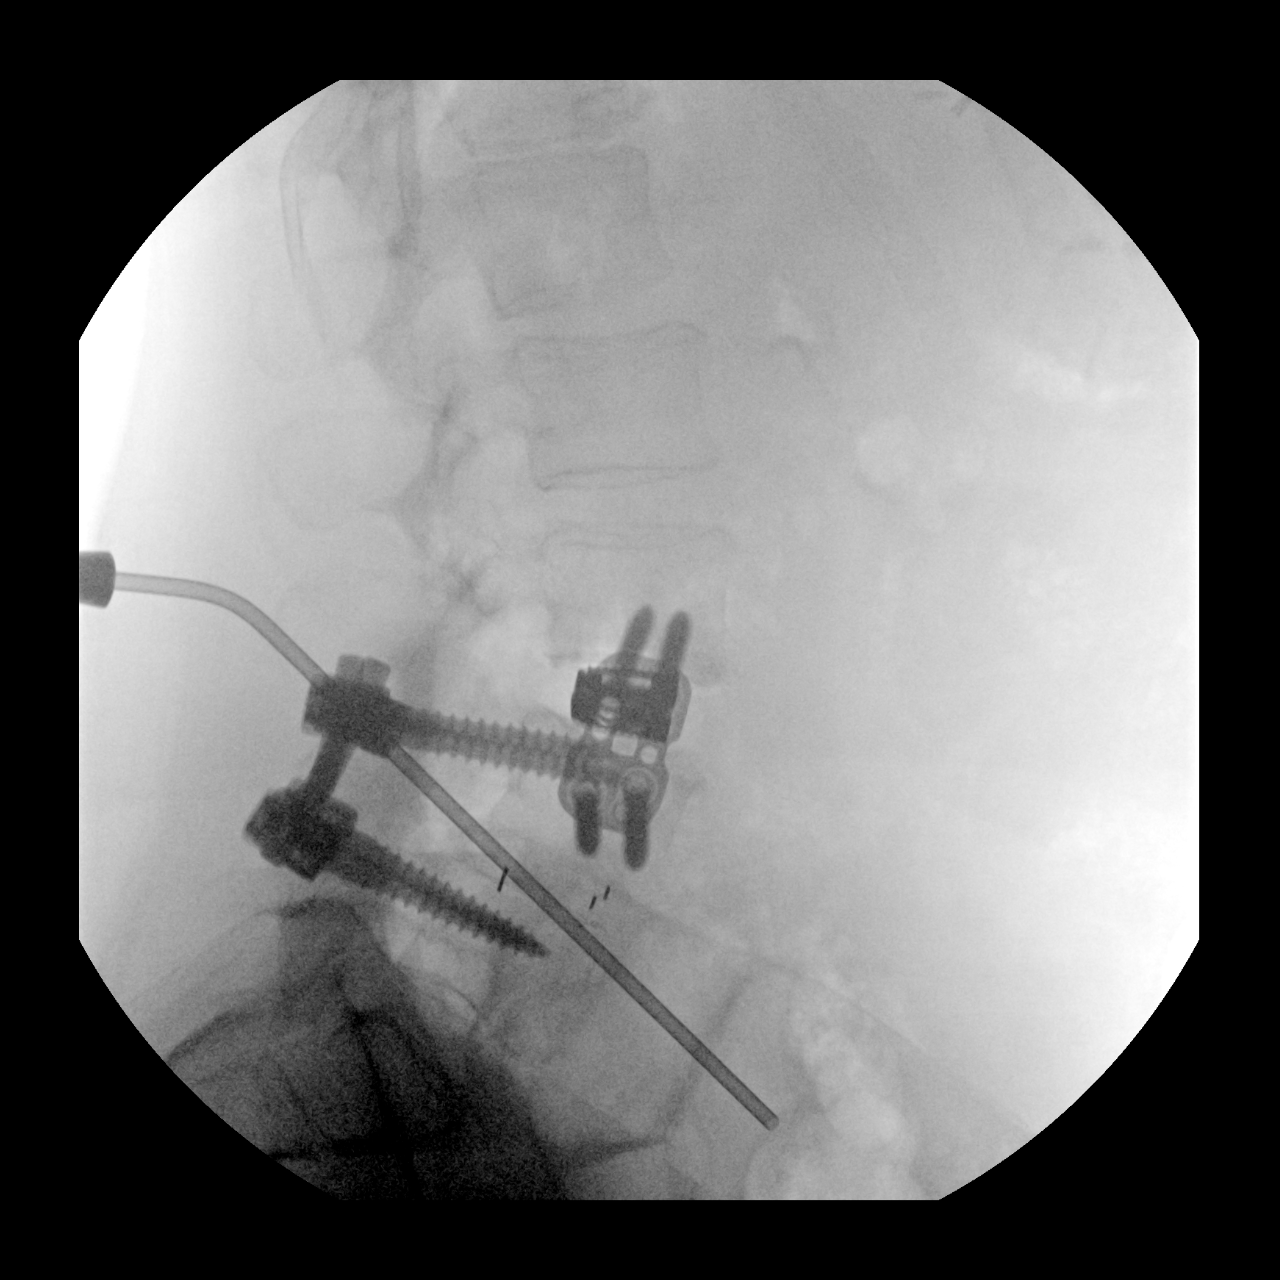

[2 of 2 positions shown; findings below may reference images not displayed]

FINDINGS: Two intraoperative fluoroscopic images were obtained of the lumbar
spine. These images demonstrate the patient be status post surgical
posterior fusion of L4-5 with bilateral intrapedicular screw
placement and interbody fusion. The patient appears to be status
post right lateral fusion of L3-4. No other radiopaque foreign body
is noted.
IMPRESSION: Fluoroscopic guidance provided during lumbar fusion. These results
were called by telephone at the time of interpretation on 11/22/2020
at [DATE] to provider Ferienhaus Erxleben, who verbally acknowledged
these results.

## 2021-08-16 IMAGING — RF DG LUMBAR SPINE 2-3V
1 series · 15 of 16 positions shown · non-contrast
Comparison: April 17, 2020.

CLINICAL DATA: Surgical posterior fusion of lumbar spine.

EXAM:
LUMBAR SPINE - 2-3 VIEW; DG C-ARM 1-60 MIN
Radiation exposure index: 22.556 mGy.

[Series 1: dg x-ray · 0.20mm/px · 15 of 16 slices shown]
[im 1/16]
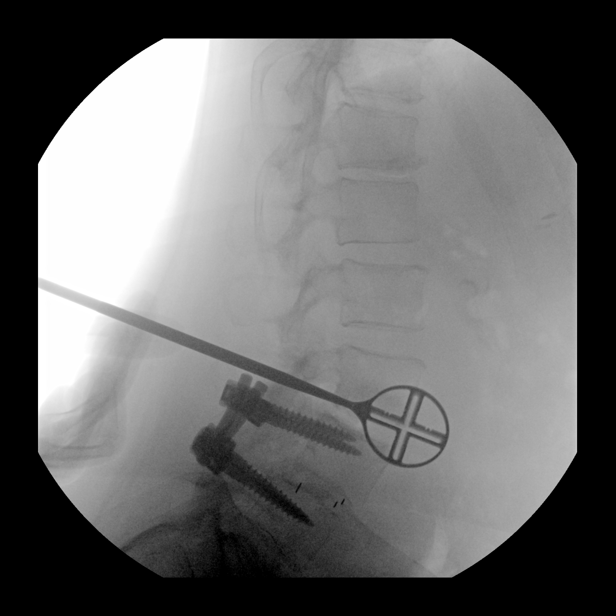
[im 2/16]
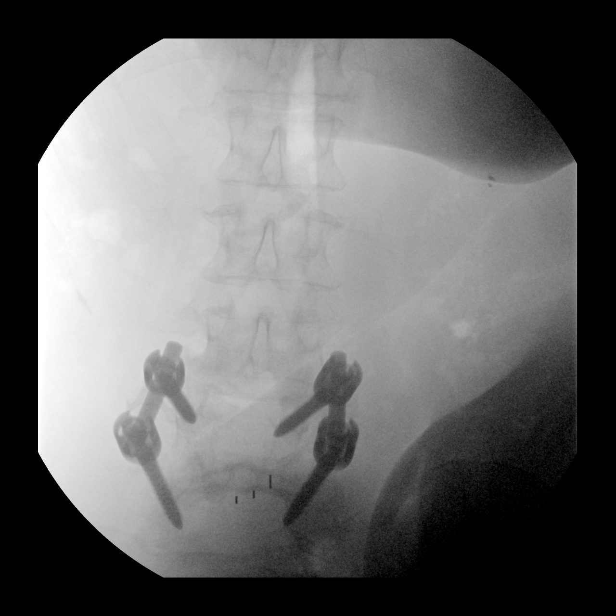
[im 3/16]
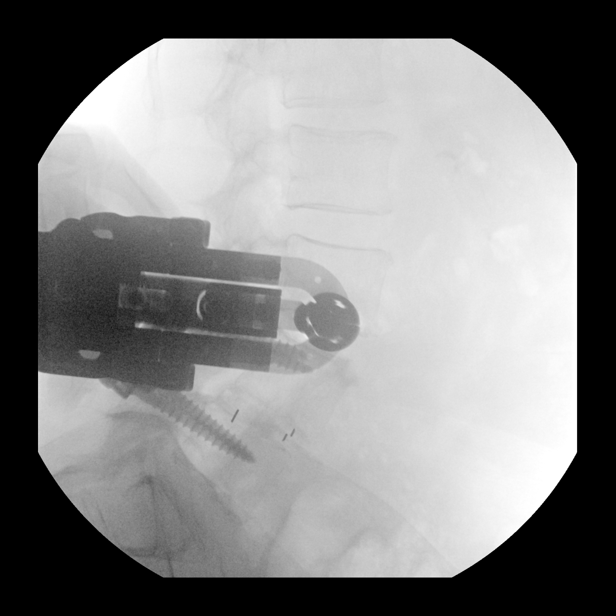
[im 4/16]
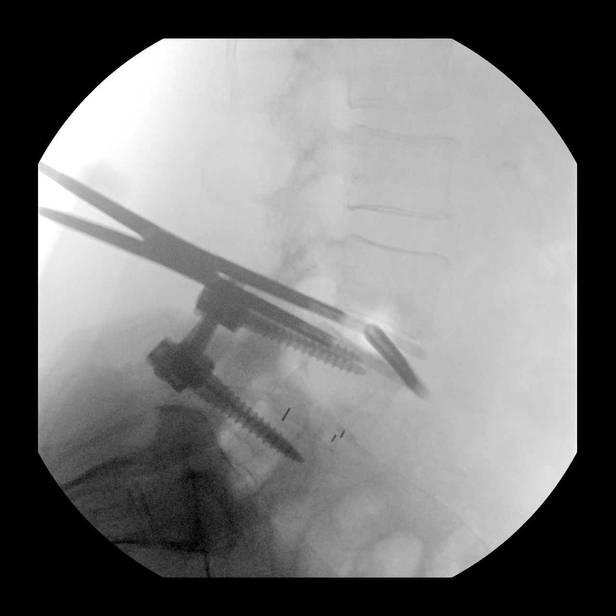
[im 5/16]
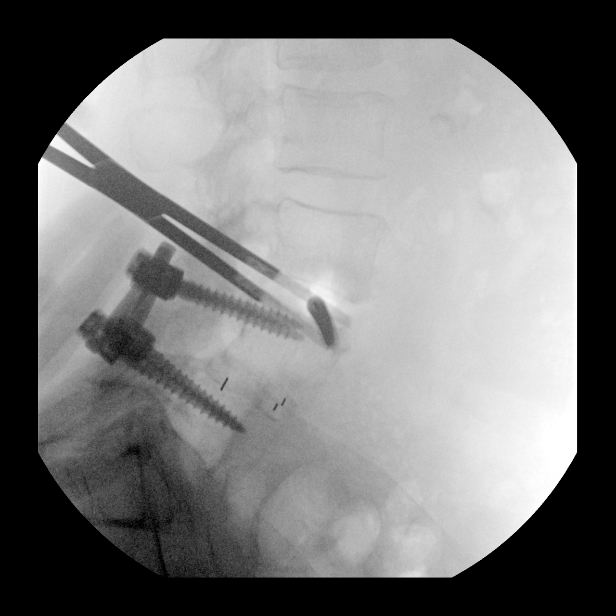
[im 6/16]
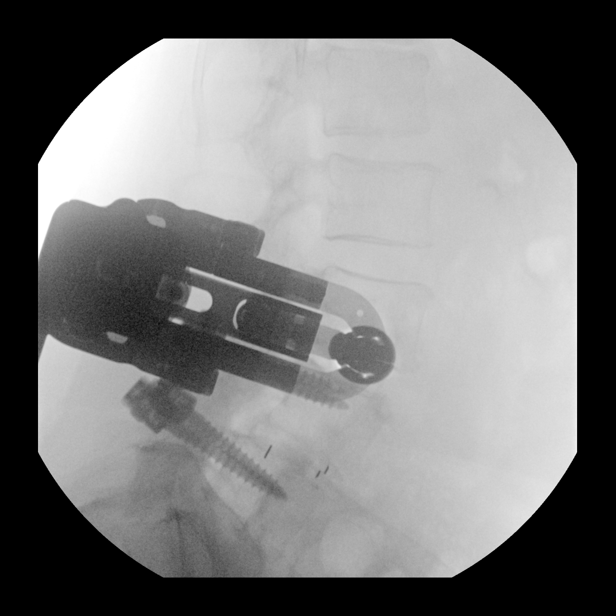
[im 7/16]
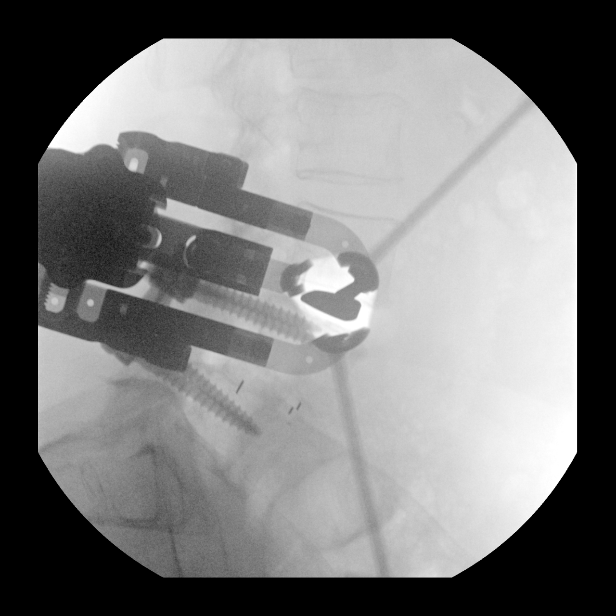
[im 9/16]
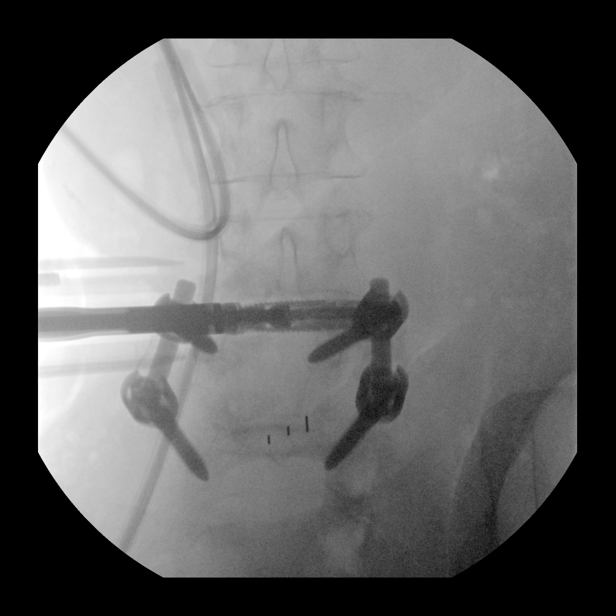
[im 10/16]
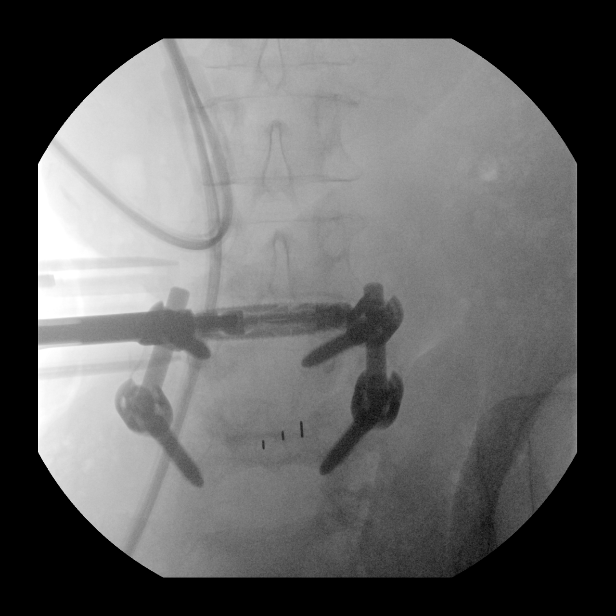
[im 11/16]
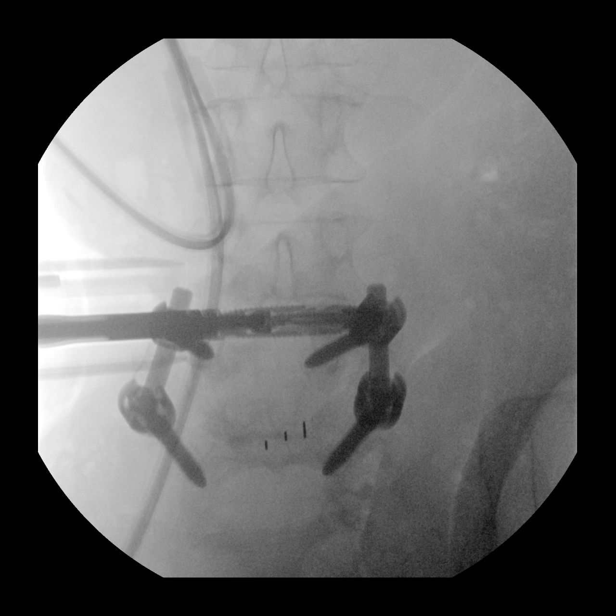
[im 12/16]
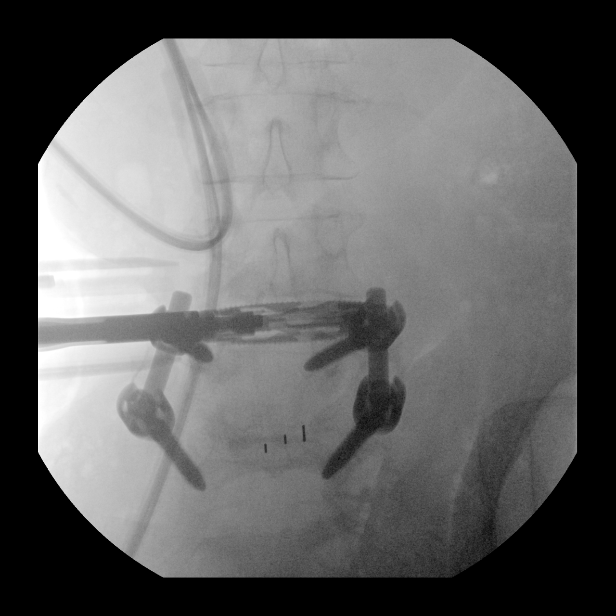
[im 13/16]
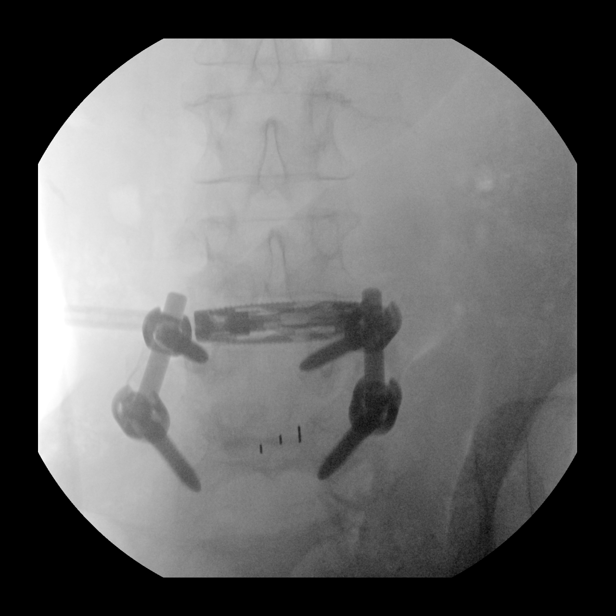
[im 14/16]
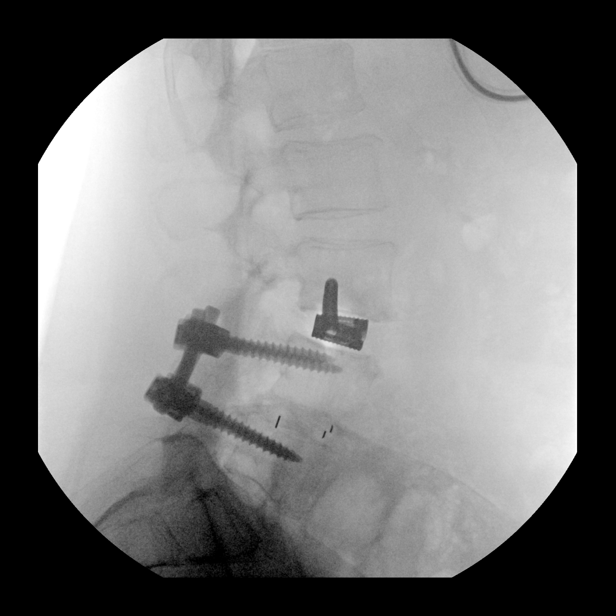
[im 15/16]
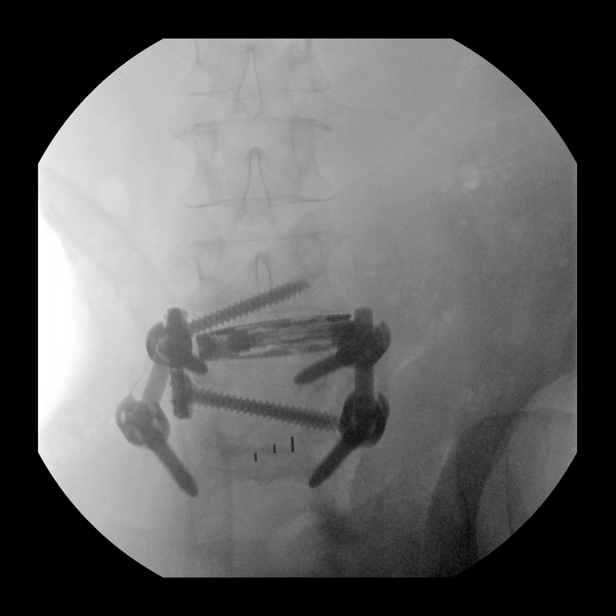
[im 16/16]
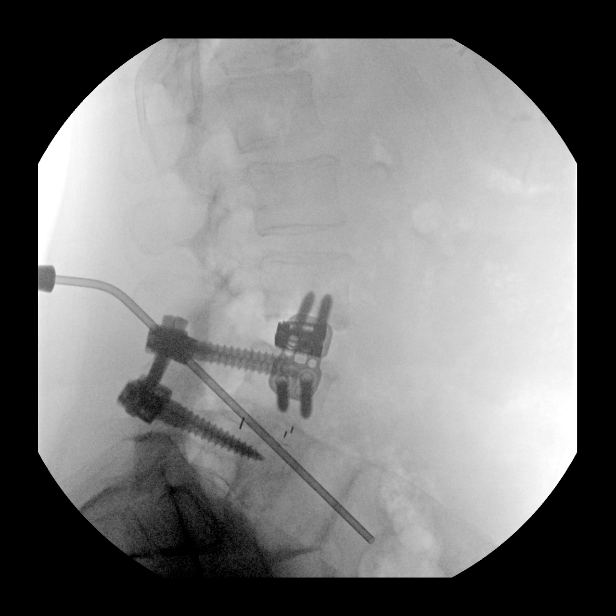

[15 of 16 positions shown; findings below may reference images not displayed]

FINDINGS: Two intraoperative fluoroscopic images were obtained of the lumbar
spine. These images demonstrate the patient be status post surgical
posterior fusion of L4-5 with bilateral intrapedicular screw
placement and interbody fusion. The patient appears to be status
post right lateral fusion of L3-4. No other radiopaque foreign body
is noted.
IMPRESSION: Fluoroscopic guidance provided during lumbar fusion. These results
were called by telephone at the time of interpretation on 11/22/2020
at [DATE] to provider Ferienhaus Erxleben, who verbally acknowledged
these results.

## 2021-08-17 IMAGING — CR DG LUMBAR SPINE 2-3V
2 series · 2 of 2 positions shown · non-contrast
Comparison: None.

CLINICAL DATA: Status post L3-4 fusion.

EXAM:
LUMBAR SPINE - 2-3 VIEW

[l-spine ap]
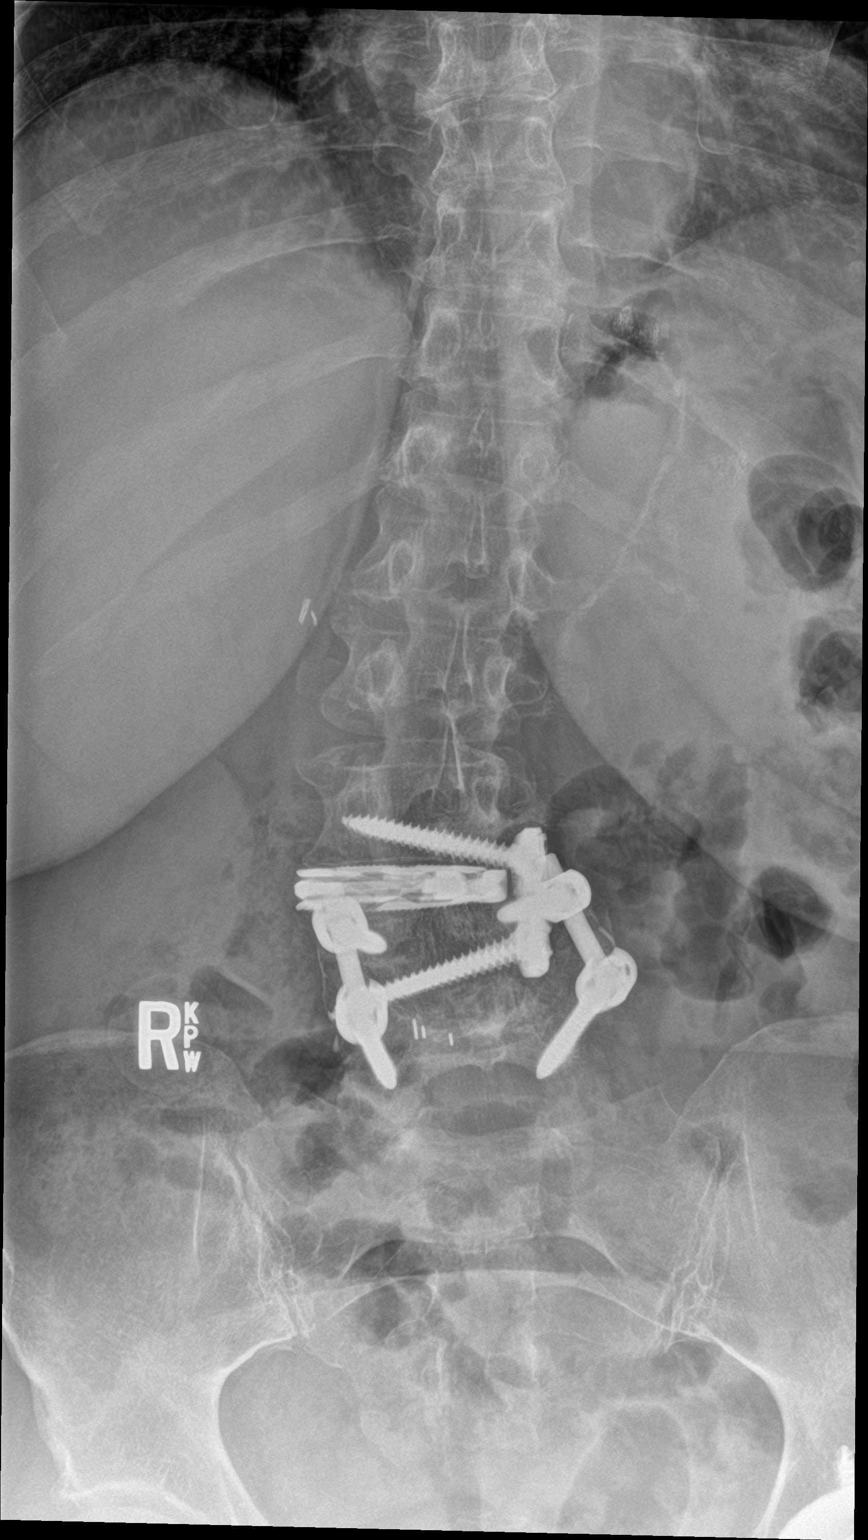

[l-spine lat]
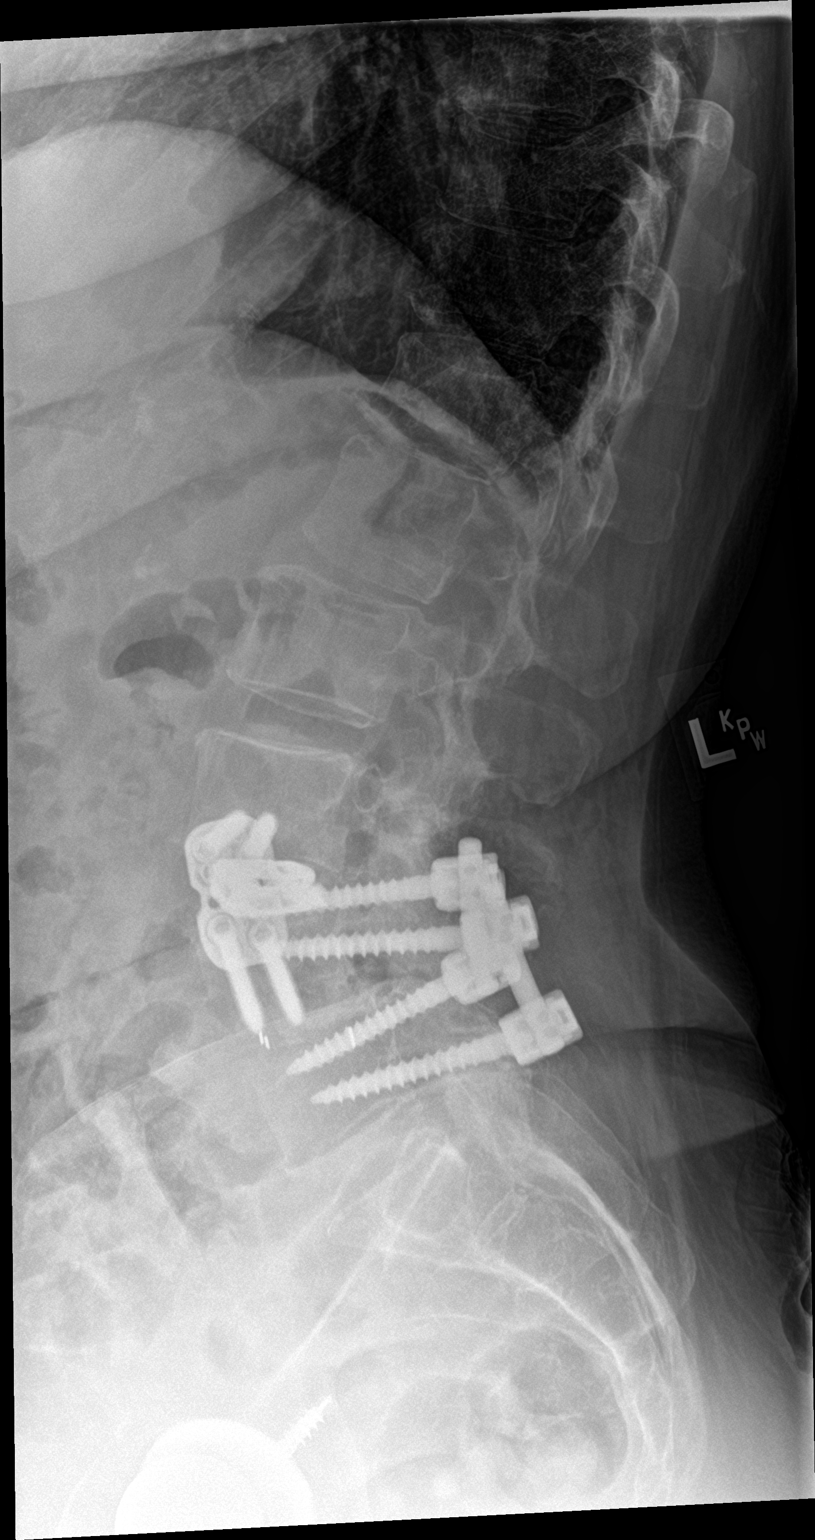

[2 of 2 positions shown; findings below may reference images not displayed]

FINDINGS: Status post surgical posterior fusion of L4-5 with bilateral
intrapedicular screw placement. No fracture or spondylolisthesis is
noted. Status post left lateral fusion of L3-4.
IMPRESSION: Postsurgical changes as described above.

## 2021-08-22 ENCOUNTER — Telehealth: Payer: Medicare Other | Admitting: Oncology

## 2021-08-22 ENCOUNTER — Inpatient Hospital Stay: Payer: Medicare Other | Admitting: Oncology

## 2021-08-22 ENCOUNTER — Other Ambulatory Visit: Payer: Self-pay

## 2021-10-05 ENCOUNTER — Encounter: Payer: Self-pay | Admitting: Internal Medicine

## 2021-10-05 NOTE — Telephone Encounter (Signed)
error 

## 2021-10-15 ENCOUNTER — Other Ambulatory Visit: Payer: Self-pay | Admitting: *Deleted

## 2021-10-15 DIAGNOSIS — D508 Other iron deficiency anemias: Secondary | ICD-10-CM

## 2021-10-17 ENCOUNTER — Other Ambulatory Visit: Payer: Medicare Other

## 2021-10-22 ENCOUNTER — Inpatient Hospital Stay: Payer: Medicare Other | Admitting: Oncology

## 2021-10-22 ENCOUNTER — Inpatient Hospital Stay: Payer: Medicare Other

## 2021-10-24 ENCOUNTER — Telehealth: Payer: Self-pay | Admitting: Oncology

## 2021-10-24 NOTE — Telephone Encounter (Signed)
Pt called to cancel her appts. She is taking care of her mother. She will call back and set up something at a later date.

## 2021-10-26 ENCOUNTER — Inpatient Hospital Stay: Payer: Medicare Other | Admitting: Nurse Practitioner

## 2021-10-26 ENCOUNTER — Inpatient Hospital Stay: Payer: Medicare Other

## 2022-01-10 ENCOUNTER — Other Ambulatory Visit: Payer: Self-pay | Admitting: Family Medicine

## 2022-01-10 DIAGNOSIS — Z1231 Encounter for screening mammogram for malignant neoplasm of breast: Secondary | ICD-10-CM

## 2022-02-04 ENCOUNTER — Inpatient Hospital Stay: Payer: Medicare Other | Attending: Oncology

## 2022-02-04 ENCOUNTER — Other Ambulatory Visit: Payer: Self-pay

## 2022-02-04 DIAGNOSIS — E538 Deficiency of other specified B group vitamins: Secondary | ICD-10-CM

## 2022-02-04 DIAGNOSIS — Z9884 Bariatric surgery status: Secondary | ICD-10-CM | POA: Diagnosis not present

## 2022-02-04 DIAGNOSIS — D508 Other iron deficiency anemias: Secondary | ICD-10-CM | POA: Insufficient documentation

## 2022-02-04 DIAGNOSIS — K9589 Other complications of other bariatric procedure: Secondary | ICD-10-CM

## 2022-02-04 LAB — FERRITIN: Ferritin: 41 ng/mL (ref 11–307)

## 2022-02-04 LAB — CBC WITH DIFFERENTIAL/PLATELET
Abs Immature Granulocytes: 0.01 10*3/uL (ref 0.00–0.07)
Basophils Absolute: 0 10*3/uL (ref 0.0–0.1)
Basophils Relative: 0 %
Eosinophils Absolute: 0.2 10*3/uL (ref 0.0–0.5)
Eosinophils Relative: 3 %
HCT: 39.7 % (ref 36.0–46.0)
Hemoglobin: 12.6 g/dL (ref 12.0–15.0)
Immature Granulocytes: 0 %
Lymphocytes Relative: 21 %
Lymphs Abs: 1.2 10*3/uL (ref 0.7–4.0)
MCH: 31 pg (ref 26.0–34.0)
MCHC: 31.7 g/dL (ref 30.0–36.0)
MCV: 97.5 fL (ref 80.0–100.0)
Monocytes Absolute: 0.5 10*3/uL (ref 0.1–1.0)
Monocytes Relative: 8 %
Neutro Abs: 3.8 10*3/uL (ref 1.7–7.7)
Neutrophils Relative %: 68 %
Platelets: 188 10*3/uL (ref 150–400)
RBC: 4.07 MIL/uL (ref 3.87–5.11)
RDW: 12.5 % (ref 11.5–15.5)
WBC: 5.7 10*3/uL (ref 4.0–10.5)
nRBC: 0 % (ref 0.0–0.2)

## 2022-02-04 LAB — IRON AND TIBC
Iron: 61 ug/dL (ref 28–170)
Saturation Ratios: 20 % (ref 10.4–31.8)
TIBC: 301 ug/dL (ref 250–450)
UIBC: 240 ug/dL

## 2022-02-04 LAB — VITAMIN B12: Vitamin B-12: 458 pg/mL (ref 180–914)

## 2022-02-08 ENCOUNTER — Encounter: Payer: Self-pay | Admitting: Oncology

## 2022-02-08 ENCOUNTER — Inpatient Hospital Stay (HOSPITAL_BASED_OUTPATIENT_CLINIC_OR_DEPARTMENT_OTHER): Payer: Medicare Other | Admitting: Oncology

## 2022-02-08 VITALS — BP 108/87 | HR 86 | Temp 98.4°F | Resp 18 | Wt 188.6 lb

## 2022-02-08 DIAGNOSIS — E538 Deficiency of other specified B group vitamins: Secondary | ICD-10-CM | POA: Diagnosis not present

## 2022-02-08 DIAGNOSIS — K9589 Other complications of other bariatric procedure: Secondary | ICD-10-CM | POA: Diagnosis not present

## 2022-02-08 DIAGNOSIS — D508 Other iron deficiency anemias: Secondary | ICD-10-CM

## 2022-02-08 NOTE — Progress Notes (Signed)
Pt states that she realized her bloodwork is in the lower normal range and is wondering if she can have an infusion to make her numbers go up some and it makes her feel better; pt has been very fatigued and tired all of the time. Pt is worried because her mother dealt with the same lower numbers and had bone cancer and pt is trying to prevent that.

## 2022-02-10 ENCOUNTER — Encounter: Payer: Self-pay | Admitting: Internal Medicine

## 2022-02-10 NOTE — Progress Notes (Signed)
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  She has a history of iron and B12 deficiency anemia secondary to it.  She self administers B12 injections at home she has received both Feraheme and Venofer in the past.   Colonoscopy on 05/20/2016 revealed the sigmoid colon, descending colon and transverse colon were significantly redundant. There was some mild mucosal irritation at about 35-40 cm on withdrawal.  EGD on 10/12/2018 revealed a variable Z-line variable. There was LA Grade B erosive esophagitis. 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  There was bile gastritis. Roux-en-Y gastrojejunostomy with gastrojejunal anastomosis was characterized by an intact staple line and healthy appearing mucosa.    Interval history-patient currently reports ongoing fatigue.  Denies any blood loss in her stool or urine.  ECOG PS- 1 Pain scale- 0   Review of systems- Review of Systems  Constitutional:  Positive for malaise/fatigue. Negative for chills, fever and weight loss.  HENT:  Negative for congestion, ear discharge and nosebleeds.   Eyes:  Negative for blurred vision.  Respiratory:  Negative for cough, hemoptysis, sputum production, shortness of breath and wheezing.   Cardiovascular:  Negative for chest pain, palpitations, orthopnea and claudication.  Gastrointestinal:  Negative for abdominal  pain, blood in stool, constipation, diarrhea, heartburn, melena, nausea and vomiting.  Genitourinary:  Negative for dysuria, flank pain, frequency, hematuria and urgency.  Musculoskeletal:  Negative for back pain, joint pain and myalgias.  Skin:  Negative for rash.  Neurological:  Negative for dizziness, tingling, focal weakness, seizures, weakness and headaches.  Endo/Heme/Allergies:  Does not bruise/bleed easily.  Psychiatric/Behavioral:  Negative for depression and suicidal ideas. The patient does not have insomnia.       Allergies  Allergen Reactions   Prednisone Palpitations    Will take if needed     Past Medical History:  Diagnosis Date   Anemia    Anxiety    Arthritis    hands, fingers, lower back   Complication of anesthesia    loss control of bowels after block for hip replacement   Depression    Gastritis    GERD (gastroesophageal reflux disease)    Headache    hx of migraines in past   Hypertension    IDA (iron deficiency anemia) 03/17/2015   IDA (iron deficiency anemia) 03/17/2015   Neck pain    s/p fall approx 1 month ago.  Transient. "Random"     Past Surgical History:  Procedure Laterality Date   ABDOMINAL HYSTERECTOMY  2008   ANTERIOR LATERAL LUMBAR FUSION WITH PERCUTANEOUS SCREW 1 LEVEL N/A 11/22/2020   Procedure: L3-4 LATERAL INTERBODY FUSION WITH PLATING;  Surgeon: Venetia Night, MD;  Location: [facility] ORS;  Service: Neurosurgery;  Laterality: N/A;  3rd case   APPENDECTOMY     BACK SURGERY  03/14/14   L4-5 laminectomy.  Dr Dutch Quint, [facility]   CHOLECYSTECTOMY     COLONOSCOPY     COLONOSCOPY WITH PROPOFOL N/A 05/13/2016   Procedure:  COLONOSCOPY WITH PROPOFOL;  Surgeon: Christena DeemMartin U Skulskie, MD;  [facility];  Service: Endoscopy;  Laterality: N/A;   COLONOSCOPY WITH PROPOFOL N/A 05/20/2016   Procedure: COLONOSCOPY WITH PROPOFOL;  Surgeon: Christena DeemMartin U Skulskie, MD;  [facility];  Service: Endoscopy;  Laterality: N/A;    ESOPHAGOGASTRODUODENOSCOPY (EGD) WITH PROPOFOL N/A 05/13/2016   Procedure: ESOPHAGOGASTRODUODENOSCOPY (EGD) WITH PROPOFOL;  Surgeon: Christena DeemMartin U Skulskie, MD;  [facility];  Service: Endoscopy;  Laterality: N/A;   ESOPHAGOGASTRODUODENOSCOPY (EGD) WITH PROPOFOL N/A 10/12/2018   Procedure: ESOPHAGOGASTRODUODENOSCOPY (EGD) WITH PROPOFOL;  Surgeon: Christena DeemSkulskie, Martin U, MD;  [facility];  Service: Endoscopy;  Laterality: N/A;   GASTRIC BYPASS  2002   JOINT REPLACEMENT Left 12/2013   [facility]   PLANTAR FASCIA RELEASE Left 03/07/2016   Procedure: 1. Partial plantar fascial release with endoscopic procedure   2. Topaz fasciotomy percutaneously;  Surgeon: Recardo EvangelistMatthew Troxler, DPM;  [facility];  Service: Podiatry;  Laterality: Left;  LMA WITH POPLITEAL TOPAZ   TONSILLECTOMY      Social History   Socioeconomic History   Marital status: Married    Spouse name: Not on file   Number of children: 1   Years of education: 12   Highest education level: High school graduate  Occupational History   Occupation: Disabled  Tobacco Use   Smoking status: Never   Smokeless tobacco: Never  Vaping Use   Vaping Use: Never used  Substance and Sexual Activity   Alcohol use: Not Currently   Drug use: No   Sexual activity: Yes  Other Topics Concern   Not on file  Social History Narrative   Lives at home with husband.   Right-handed.   No caffeine use.   Social Determinants of Health   Financial Resource Strain: Not on file  Food Insecurity: Not on file  Transportation Needs: Not on file  Physical Activity: Not on file  Stress: Not on file  Social Connections: Not on file  Intimate Partner Violence: Not on file    Family History  Problem Relation Age of Onset   Diabetes Mother    Stroke Mother    Heart attack Father        died at age 62   Heart disease Father    Macular degeneration Maternal Grandmother    Dementia Maternal Grandmother     Prostate cancer Maternal Grandfather    Heart attack Paternal Grandmother    Heart disease Paternal Grandmother    Heart attack Paternal Grandfather    Heart disease Paternal Grandfather    Breast cancer Neg Hx      Current Outpatient Medications:    aspirin 81 MG tablet, Take 81 mg by mouth daily., Disp: , Rfl:    Calcium Carbonate-Vitamin D 600-400 MG-UNIT tablet, Take 2 tablets by mouth 2 (two) times daily., Disp: , Rfl:    Coenzyme Q10 100 MG capsule, Take 100 mg by mouth daily., Disp: , Rfl:    cyanocobalamin (,VITAMIN B-12,) 1000 MCG/ML injection, Inject 1 mL (1,000 mcg total) into the muscle every 30 (thirty) days., Disp: 3 mL, Rfl: 9   fexofenadine (ALLEGRA) 180 MG tablet, Take 180 mg by mouth daily as needed for allergies., Disp: , Rfl:    gabapentin (NEURONTIN) 600 MG tablet, Take 600 mg by mouth 2 (two) times daily., Disp: , Rfl:    hyoscyamine (LEVSIN SL) 0.125 MG SL tablet, Place under the tongue every 6 (six) hours as needed., Disp: , Rfl:  losartan (COZAAR) 50 MG tablet, Take 50 mg by mouth daily., Disp: , Rfl:    meloxicam (MOBIC) 15 MG tablet, Take 1 tablet by mouth daily., Disp: , Rfl:    methocarbamol (ROBAXIN) 500 MG tablet, Take 1 tablet by mouth every 6 (six) hours as needed., Disp: , Rfl:    Multiple Vitamin (MULTIVITAMIN) tablet, Take 1 tablet by mouth daily., Disp: , Rfl:    Omega-3 Fatty Acids (FISH OIL) 1200 MG CAPS, Take 1,200 mg by mouth daily. , Disp: , Rfl:    pantoprazole (PROTONIX) 40 MG tablet, Take 40 mg by mouth 2 (two) times daily. , Disp: , Rfl: 2   polyethylene glycol-electrolytes (NULYTELY) 420 g solution, Use as directed for colon cleanse., Disp: , Rfl:    sertraline (ZOLOFT) 100 MG tablet, Take 1 tablet (100 mg total) by mouth daily., Disp: 90 tablet, Rfl: 1   sucralfate (CARAFATE) 1 g tablet, Take 1 g by mouth 2 (two) times daily. , Disp: , Rfl:    Vitamin D, Ergocalciferol, (DRISDOL) 50000 units CAPS capsule, Take 50,000 Units by mouth 2 (two)  times a week., Disp: , Rfl: 3   HYDROcodone-acetaminophen (NORCO/VICODIN) 5-325 MG tablet, Take 1-2 tablets by mouth every 6 (six) hours as needed. (Patient not taking: Reported on 02/08/2022), Disp: 12 tablet, Rfl: 0   predniSONE (DELTASONE) 5 MG tablet, Take 1 tablet (5 mg total) by mouth daily with breakfast. (Patient not taking: Reported on 02/08/2022), Disp: 10 tablet, Rfl: 0   terbinafine (LAMISIL) 250 MG tablet, Take 250 mg by mouth daily. (Patient not taking: Reported on 02/08/2022), Disp: , Rfl:    traZODone (DESYREL) 100 MG tablet, Take 100-200 mg by mouth at bedtime as needed for sleep. (Patient not taking: Reported on 02/14/2021), Disp: , Rfl:   Physical exam:  Vitals:   02/08/22 1402  BP: 108/87  Pulse: 86  Resp: 18  Temp: 98.4 F (36.9 C)  SpO2: 97%  Weight: 188 lb 9.6 oz (85.5 kg)   Physical Exam Constitutional:      General: She is not in acute distress. Cardiovascular:     Rate and Rhythm: Normal rate and regular rhythm.     Heart sounds: Normal heart sounds.  Pulmonary:     Effort: Pulmonary effort is normal.     Breath sounds: Normal breath sounds.  Abdominal:     General: Bowel sounds are normal.     Palpations: Abdomen is soft.  Skin:    General: Skin is warm and dry.  Neurological:     Mental Status: She is alert and oriented to person, place, and time.        Latest Ref Rng & Units 07/11/2021    9:10 AM  CMP  Glucose 70 - 99 mg/dL 99    BUN 8 - 23 mg/dL 16    Creatinine 6.76 - 1.00 mg/dL 1.95    Sodium 093 - 267 mmol/L 137    Potassium 3.5 - 5.1 mmol/L 3.8    Chloride 98 - 111 mmol/L 106    CO2 22 - 32 mmol/L 27    Calcium 8.9 - 10.3 mg/dL 8.5        Latest Ref Rng & Units 02/04/2022   11:19 AM  CBC  WBC 4.0 - 10.5 K/uL 5.7    Hemoglobin 12.0 - 15.0 g/dL 12.4    Hematocrit 58.0 - 46.0 % 39.7    Platelets 150 - 400 K/uL 188       Assessment and plan-  Patient is a 61 y.o. female with history of iron and B12 deficiency anemia secondary to  gastric bypass here for routine follow-up  Patient is not presently anemicWith a hemoglobin of 12.6 which is remained stable over the last 1 year.  Ferritin levels are normal at 41 with an iron saturation of 20%.  B12 levels are normal at 458.  Her fatigue is therefore not explained by her iron and B12 deficiency anemia and she does not require IV iron at this time.  Repeat CBC ferritin and iron studies in 4 and 8 months and I will see her back in 8 months   Visit Diagnosis 1. Iron deficiency anemia following bariatric surgery   2. B12 deficiency      Dr. Owens Shark, MD, MPH [facility] [phone] 02/10/2022 6:29 PM

## 2022-02-25 ENCOUNTER — Ambulatory Visit
Admission: RE | Admit: 2022-02-25 | Discharge: 2022-02-25 | Disposition: A | Discharge status: Home / Self Care | Payer: Medicare Other | Source: Ambulatory Visit | Attending: Family Medicine | Admitting: Family Medicine

## 2022-02-25 DIAGNOSIS — Z1231 Encounter for screening mammogram for malignant neoplasm of breast: Secondary | ICD-10-CM | POA: Insufficient documentation

## 2022-03-04 ENCOUNTER — Other Ambulatory Visit: Payer: Self-pay | Admitting: *Deleted

## 2022-03-04 DIAGNOSIS — E538 Deficiency of other specified B group vitamins: Secondary | ICD-10-CM

## 2022-03-04 MED ORDER — CYANOCOBALAMIN 1000 MCG/ML IJ SOLN: 1000.0000 ug | INTRAMUSCULAR | 9 refills | Status: DC

## 2022-04-01 ENCOUNTER — Telehealth: Payer: Self-pay

## 2022-04-01 NOTE — Telephone Encounter (Signed)
-----   Message from [facility] sent at 04/01/2022  8:19 AM EDT ----- Regarding: med refill Contact: [phone] Gabapentin 300mg   2 at night 600mg  3-4 times a day [facility] [place]

## 2022-04-02 NOTE — Telephone Encounter (Signed)
Please notify the patient about the message below.

## 2022-04-03 NOTE — Telephone Encounter (Signed)
Patient notified and she will contact her PCP. 

## 2022-06-11 ENCOUNTER — Inpatient Hospital Stay: Payer: Medicare Other | Attending: Oncology

## 2022-06-11 DIAGNOSIS — D519 Vitamin B12 deficiency anemia, unspecified: Secondary | ICD-10-CM | POA: Insufficient documentation

## 2022-06-11 DIAGNOSIS — D509 Iron deficiency anemia, unspecified: Secondary | ICD-10-CM | POA: Diagnosis not present

## 2022-06-11 DIAGNOSIS — E538 Deficiency of other specified B group vitamins: Secondary | ICD-10-CM

## 2022-06-11 DIAGNOSIS — D508 Other iron deficiency anemias: Secondary | ICD-10-CM

## 2022-06-11 DIAGNOSIS — Z9884 Bariatric surgery status: Secondary | ICD-10-CM | POA: Insufficient documentation

## 2022-06-11 LAB — IRON AND TIBC
Iron: 91 ug/dL (ref 28–170)
Saturation Ratios: 23 % (ref 10.4–31.8)
TIBC: 395 ug/dL (ref 250–450)
UIBC: 304 ug/dL

## 2022-06-11 LAB — CBC WITH DIFFERENTIAL/PLATELET
Abs Immature Granulocytes: 0.01 10*3/uL (ref 0.00–0.07)
Basophils Absolute: 0 10*3/uL (ref 0.0–0.1)
Basophils Relative: 1 %
Eosinophils Absolute: 0.3 10*3/uL (ref 0.0–0.5)
Eosinophils Relative: 5 %
HCT: 40.6 % (ref 36.0–46.0)
Hemoglobin: 12.7 g/dL (ref 12.0–15.0)
Immature Granulocytes: 0 %
Lymphocytes Relative: 28 %
Lymphs Abs: 1.7 10*3/uL (ref 0.7–4.0)
MCH: 30.8 pg (ref 26.0–34.0)
MCHC: 31.3 g/dL (ref 30.0–36.0)
MCV: 98.3 fL (ref 80.0–100.0)
Monocytes Absolute: 0.5 10*3/uL (ref 0.1–1.0)
Monocytes Relative: 8 %
Neutro Abs: 3.5 10*3/uL (ref 1.7–7.7)
Neutrophils Relative %: 58 %
Platelets: 207 10*3/uL (ref 150–400)
RBC: 4.13 MIL/uL (ref 3.87–5.11)
RDW: 13.2 % (ref 11.5–15.5)
WBC: 6 10*3/uL (ref 4.0–10.5)
nRBC: 0 % (ref 0.0–0.2)

## 2022-06-11 LAB — VITAMIN B12: Vitamin B-12: 319 pg/mL (ref 180–914)

## 2022-06-11 LAB — FERRITIN: Ferritin: 16 ng/mL (ref 11–307)

## 2022-08-04 ENCOUNTER — Encounter: Payer: Self-pay | Admitting: Emergency Medicine

## 2022-08-04 ENCOUNTER — Ambulatory Visit
Admission: EM | Admit: 2022-08-04 | Discharge: 2022-08-04 | Disposition: A | Discharge status: Home / Self Care | Payer: Medicare Other | Attending: Family Medicine | Admitting: Family Medicine

## 2022-08-04 DIAGNOSIS — H6992 Unspecified Eustachian tube disorder, left ear: Secondary | ICD-10-CM

## 2022-08-04 DIAGNOSIS — J310 Chronic rhinitis: Secondary | ICD-10-CM

## 2022-08-04 MED ORDER — FLUTICASONE PROPIONATE 50 MCG/ACT NA SUSP: 2.0000 | Freq: Every day | NASAL | 0 refills | Status: AC

## 2022-08-04 NOTE — ED Triage Notes (Signed)
Patient states that she has URI symptoms for a week.  Patient reports left sided ear pain that started couple days ago.  Patient reports nasal congestion and sinus pressure.  Patient denies fevers.

## 2022-08-04 NOTE — ED Provider Notes (Signed)
[facility]    CSN: [id number] Arrival date & time: 08/04/22  1016      History   Chief Complaint Chief Complaint  Patient presents with   Otalgia    left    HPI  61 year old female presents for evaluation of the above.  Patient states that she states symptoms over the past week.  Reports nasal congestion and some left ear pain/discomfort.  She reports that she has had cough as well.  No fever.  She has recently run out of her Flonase.  She states that when she is using this she is doing well.  Additionally, patient is concerned given her blood pressure elevation today.  Past Medical History:  Diagnosis Date   Anemia    Anxiety    Arthritis    hands, fingers, lower back   Complication of anesthesia    loss control of bowels after block for hip replacement   Depression    Gastritis    GERD (gastroesophageal reflux disease)    Headache    hx of migraines in past   Hypertension    IDA (iron deficiency anemia) 03/17/2015   IDA (iron deficiency anemia) 03/17/2015   Neck pain    s/p fall approx 1 month ago.  Transient. 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  Dr Trenton Gammon, [facility]    CHOLECYSTECTOMY     COLONOSCOPY     COLONOSCOPY WITH PROPOFOL N/A 05/13/2016   Procedure: COLONOSCOPY WITH PROPOFOL;  Surgeon: Lollie Sails, MD;  Location: [facility] ENDOSCOPY;  Service: Endoscopy;  Laterality: N/A;   COLONOSCOPY WITH PROPOFOL N/A 05/20/2016   Procedure: COLONOSCOPY WITH PROPOFOL;  Surgeon: Lollie Sails, MD;  Location: [facility] ENDOSCOPY;  Service: Endoscopy;  Laterality: N/A;   ESOPHAGOGASTRODUODENOSCOPY (EGD) WITH PROPOFOL N/A 05/13/2016   Procedure: ESOPHAGOGASTRODUODENOSCOPY (EGD) WITH PROPOFOL;  Surgeon: Lollie Sails, MD;  Location: [facility] ENDOSCOPY;  Service: Endoscopy;  Laterality: N/A;   ESOPHAGOGASTRODUODENOSCOPY (EGD) WITH PROPOFOL N/A 10/12/2018   Procedure: ESOPHAGOGASTRODUODENOSCOPY (EGD) WITH PROPOFOL;  Surgeon: Lollie Sails, MD;  Location: [facility] ENDOSCOPY;  Service: Endoscopy;  Laterality: N/A;   GASTRIC BYPASS  2002   JOINT REPLACEMENT Left 12/2013   hip replacement at [place] Left 03/07/2016   Procedure: 1. Partial plantar fascial release with endoscopic procedure   2. Topaz fasciotomy percutaneously;  Surgeon: Albertine Patricia, DPM;  Location: [place];  Service: Podiatry;  Laterality: Left;  LMA WITH POPLITEAL TOPAZ   TONSILLECTOMY      OB History   No obstetric history on file.      Home Medications    Prior to Admission medications   Medication Sig Start Date End Date Taking? Authorizing Provider  fluticasone (  FLONASE) 50 MCG/ACT nasal spray Place 2 sprays into both nostrils daily. 08/04/22  Yes Qualyn Oyervides G, DO  aspirin 81 MG tablet Take 81 mg by mouth daily.    [provider]  Calcium Carbonate-Vitamin D 600-400 MG-UNIT tablet Take 2 tablets by mouth 2 (two) times daily.    [provider]  Coenzyme Q10 100 MG capsule Take 100 mg by mouth daily.    [provider]  cyanocobalamin (,VITAMIN B-12,) 1000 MCG/ML injection Inject 1 mL (1,000 mcg total) into the muscle every 30 (thirty)  days. 03/04/22   Sindy Guadeloupe, MD  fexofenadine (ALLEGRA) 180 MG tablet Take 180 mg by mouth daily as needed for allergies.    [provider]  gabapentin (NEURONTIN) 600 MG tablet Take 600 mg by mouth 2 (two) times daily.    [provider]  hyoscyamine (LEVSIN SL) 0.125 MG SL tablet Place under the tongue every 6 (six) hours as needed. 12/26/20   [provider]  losartan (COZAAR) 50 MG tablet Take 50 mg by mouth daily. 11/20/16   [provider]  meloxicam (MOBIC) 15 MG tablet Take 1 tablet by mouth daily. 12/26/20   [provider]  methocarbamol (ROBAXIN) 500 MG tablet Take 1 tablet by mouth every 6 (six) hours as needed. 01/01/21   [provider]  Multiple Vitamin (MULTIVITAMIN) tablet Take 1 tablet by mouth daily.    [provider]  Omega-3 Fatty Acids (FISH OIL) 1200 MG CAPS Take 1,200 mg by mouth daily.     [provider]  pantoprazole (PROTONIX) 40 MG tablet Take 40 mg by mouth 2 (two) times daily.  02/27/18   [provider]  polyethylene glycol-electrolytes (NULYTELY) 420 g solution Use as directed for colon cleanse. 12/26/20   [provider]  sertraline (ZOLOFT) 100 MG tablet Take 1 tablet (100 mg total) by mouth daily. 04/11/18   Coral Spikes, DO  sucralfate (CARAFATE) 1 g tablet Take 1 g by mouth 2 (two) times daily.     [provider]  Vitamin D, Ergocalciferol, (DRISDOL) 50000 units CAPS capsule Take 50,000 Units by mouth 2 (two) times a week. 05/30/18   [provider]  escitalopram (LEXAPRO) 10 MG tablet Take 10 mg by mouth daily.  02/24/21  [provider]    Family History Family History  Problem Relation Age of Onset   Diabetes Mother    Stroke Mother    Heart attack Father        died at age 26   Heart disease Father    Macular degeneration Maternal Grandmother    Dementia Maternal Grandmother    Prostate cancer Maternal Grandfather    Heart attack Paternal  Grandmother    Heart disease Paternal Grandmother    Heart attack Paternal Grandfather    Heart disease Paternal Grandfather    Breast cancer Neg Hx     Social History Social History   Tobacco Use   Smoking status: Never   Smokeless tobacco: Never  Vaping Use   Vaping Use: Never used  Substance Use Topics   Alcohol use: Not Currently   Drug use: No     Allergies   Prednisone   Review of Systems Review of Systems Per HPI  Physical Exam Triage Vital Signs ED Triage Vitals  Enc Vitals Group     BP 08/04/22 1040 (!) 144/104     Pulse Rate 08/04/22 1040 78     Resp 08/04/22 1040 14  Temp 08/04/22 1040 98.5 F (36.9 C)     Temp Source 08/04/22 1040 Oral     SpO2 08/04/22 1040 98 %     Weight 08/04/22 1039 188 lb 7.9 oz (85.5 kg)     Height 08/04/22 1039 5\' 3"  (1.6 m)     Head Circumference --      Peak Flow --      Pain Score 08/04/22 1039 3     Pain Loc --      Pain Edu? --      Excl. in GC? --    No data found.  Updated Vital Signs BP 130/87 (BP Location: Left Arm)   Pulse 78   Temp 98.5 F (36.9 C) (Oral)   Resp 14   Ht 5\' 3"  (1.6 m)   Wt 85.5 kg   SpO2 98%   BMI 33.39 kg/m   Visual Acuity Right Eye Distance:   Left Eye Distance:   Bilateral Distance:    Right Eye Near:   Left Eye Near:    Bilateral Near:     Physical Exam Vitals and nursing note reviewed.  Constitutional:      General: She is not in acute distress.    Appearance: Normal appearance.  HENT:     Head: Normocephalic and atraumatic.     Right Ear: Tympanic membrane normal.     Left Ear: Tympanic membrane normal.     Nose: Congestion present.     Mouth/Throat:     Pharynx: Oropharynx is clear.  Eyes:     General:        Right eye: No discharge.        Left eye: No discharge.     Conjunctiva/sclera: Conjunctivae normal.  Cardiovascular:     Rate and Rhythm: Normal rate and regular rhythm.  Pulmonary:     Effort: Pulmonary effort is normal.     Breath sounds:  Normal breath sounds. No wheezing or rales.  Neurological:     Mental Status: She is alert.      UC Treatments / Results  Labs (all labs ordered are listed, but only abnormal results are displayed) Labs Reviewed - No data to display  EKG   Radiology No results found.  Procedures Procedures (including critical care time)  Medications Ordered in UC Medications - No data to display  Initial Impression / Assessment and Plan / UC Course  I have reviewed the triage vital signs and the nursing notes.  Pertinent labs & imaging results that were available during my care of the patient were reviewed by me and considered in my medical decision making (see chart for details).    61 year old female presents with rhinitis and eustachian tube dysfunction likely secondary to viral illness.  Flonase as directed.  Supportive care.  Final Clinical Impressions(s) / UC Diagnoses   Final diagnoses:  Rhinitis, unspecified type  Dysfunction of left eustachian tube     Discharge Instructions      Medication as prescribed.  Lots of fluids.  Keep an eye on your BP at home and follow up with your PCP.  Take care  Dr.   ED Prescriptions     Medication Sig Dispense Auth. Provider   fluticasone (FLONASE) 50 MCG/ACT nasal spray Place 2 sprays into both nostrils daily. 16 g 68, DO      PDMP not reviewed this encounter.   Adriana Simas, Tommie Sams 08/04/22 1135

## 2022-08-04 NOTE — Discharge Instructions (Addendum)
Medication as prescribed.  Lots of fluids.  Keep an eye on your BP at home and follow up with your PCP.  Take care  Dr. Adriana Simas

## 2022-08-20 ENCOUNTER — Telehealth: Payer: Self-pay | Admitting: *Deleted

## 2022-08-20 NOTE — Telephone Encounter (Signed)
If she is feeling weak- we can get her scheduled for IV iron. Keep f/u appt in January as scheduled

## 2022-08-20 NOTE — Telephone Encounter (Signed)
Patient called Upset that she was not given iron infusion at her last appointment. She reports not feeling well and states she feels best when her "Ferritin is in the hundreds not 11". She is asking for an explanation and would like to feel better for the holidays. Next appointment is 10/11/22. Please advise  Component Ref Range & Units 2 mo ago (06/11/22) 6 mo ago (02/04/22) 1 yr ago (02/07/21) 1 yr ago (10/30/20) 2 yr ago (05/09/20) 3 yr ago (01/04/19) 4 yr ago (06/29/18)  Ferritin 11 - 307 ng/mL 16 41 CM 77 CM 104 CM 61 CM 79 CM 106 CM   Component Ref Range & Units 2 mo ago (06/11/22) 6 mo ago (02/04/22) 1 yr ago (02/07/21) 1 yr ago (10/30/20) 2 yr ago (05/09/20) 3 yr ago (01/04/19) 4 yr ago (06/29/18)  Iron 28 - 170 ug/dL 91 61 494 92 99 38 78  TIBC 250 - 450 ug/dL 496 759 163 846 659 935 249 Low   Saturation Ratios 10.4 - 31.8 % 23 20 38 High  31 35 High  15 31  UIBC ug/dL 701 779 CM 390 CM 300 CM 188 CM 215 CM 171 CM

## 2022-08-23 ENCOUNTER — Inpatient Hospital Stay: Payer: Medicare Other | Attending: Oncology

## 2022-08-23 ENCOUNTER — Encounter: Payer: Self-pay | Admitting: *Deleted

## 2022-08-23 VITALS — BP 142/96 | HR 86 | Temp 98.1°F | Resp 16

## 2022-08-23 DIAGNOSIS — D519 Vitamin B12 deficiency anemia, unspecified: Secondary | ICD-10-CM | POA: Diagnosis not present

## 2022-08-23 DIAGNOSIS — D509 Iron deficiency anemia, unspecified: Secondary | ICD-10-CM | POA: Diagnosis present

## 2022-08-23 DIAGNOSIS — D508 Other iron deficiency anemias: Secondary | ICD-10-CM

## 2022-08-23 DIAGNOSIS — Z9884 Bariatric surgery status: Secondary | ICD-10-CM | POA: Diagnosis not present

## 2022-08-23 MED ORDER — SODIUM CHLORIDE 0.9 % IV SOLN
510.0000 mg | Freq: Once | INTRAVENOUS | Status: AC
  Administered 2022-08-23: 510 mg via INTRAVENOUS
  Filled 2022-08-23: qty 510

## 2022-08-23 MED ORDER — SODIUM CHLORIDE 0.9 % IV SOLN
Freq: Once | INTRAVENOUS | Status: AC
  Filled 2022-08-23: qty 250

## 2022-09-03 ENCOUNTER — Inpatient Hospital Stay: Payer: Medicare Other

## 2022-09-03 VITALS — BP 137/97 | HR 94 | Temp 97.1°F | Resp 18

## 2022-09-03 DIAGNOSIS — D508 Other iron deficiency anemias: Secondary | ICD-10-CM

## 2022-09-03 DIAGNOSIS — D509 Iron deficiency anemia, unspecified: Secondary | ICD-10-CM | POA: Diagnosis not present

## 2022-09-03 MED ORDER — SODIUM CHLORIDE 0.9 % IV SOLN
510.0000 mg | Freq: Once | INTRAVENOUS | Status: AC
  Administered 2022-09-03: 510 mg via INTRAVENOUS
  Filled 2022-09-03: qty 510

## 2022-09-03 MED ORDER — SODIUM CHLORIDE 0.9 % IV SOLN
Freq: Once | INTRAVENOUS | Status: AC
  Filled 2022-09-03: qty 250

## 2022-09-03 NOTE — Patient Instructions (Signed)

## 2022-10-11 ENCOUNTER — Encounter: Payer: Self-pay | Admitting: Oncology

## 2022-10-11 ENCOUNTER — Inpatient Hospital Stay: Payer: Medicare Other | Attending: Oncology

## 2022-10-11 ENCOUNTER — Inpatient Hospital Stay (HOSPITAL_BASED_OUTPATIENT_CLINIC_OR_DEPARTMENT_OTHER): Payer: Medicare Other | Admitting: Oncology

## 2022-10-11 VITALS — BP 124/94 | HR 79 | Temp 98.7°F | Resp 16 | Wt 203.0 lb

## 2022-10-11 DIAGNOSIS — E538 Deficiency of other specified B group vitamins: Secondary | ICD-10-CM | POA: Diagnosis not present

## 2022-10-11 DIAGNOSIS — D519 Vitamin B12 deficiency anemia, unspecified: Secondary | ICD-10-CM | POA: Insufficient documentation

## 2022-10-11 DIAGNOSIS — K9589 Other complications of other bariatric procedure: Secondary | ICD-10-CM

## 2022-10-11 DIAGNOSIS — D508 Other iron deficiency anemias: Secondary | ICD-10-CM

## 2022-10-11 DIAGNOSIS — Z9884 Bariatric surgery status: Secondary | ICD-10-CM | POA: Diagnosis not present

## 2022-10-11 DIAGNOSIS — R5383 Other fatigue: Secondary | ICD-10-CM | POA: Insufficient documentation

## 2022-10-11 LAB — CBC WITH DIFFERENTIAL/PLATELET
Abs Immature Granulocytes: 0.02 10*3/uL (ref 0.00–0.07)
Basophils Absolute: 0 10*3/uL (ref 0.0–0.1)
Basophils Relative: 1 %
Eosinophils Absolute: 0.2 10*3/uL (ref 0.0–0.5)
Eosinophils Relative: 4 %
HCT: 38.7 % (ref 36.0–46.0)
Hemoglobin: 12.7 g/dL (ref 12.0–15.0)
Immature Granulocytes: 0 %
Lymphocytes Relative: 24 %
Lymphs Abs: 1.4 10*3/uL (ref 0.7–4.0)
MCH: 31.1 pg (ref 26.0–34.0)
MCHC: 32.8 g/dL (ref 30.0–36.0)
MCV: 94.6 fL (ref 80.0–100.0)
Monocytes Absolute: 0.4 10*3/uL (ref 0.1–1.0)
Monocytes Relative: 7 %
Neutro Abs: 3.8 10*3/uL (ref 1.7–7.7)
Neutrophils Relative %: 64 %
Platelets: 188 10*3/uL (ref 150–400)
RBC: 4.09 MIL/uL (ref 3.87–5.11)
RDW: 13.4 % (ref 11.5–15.5)
WBC: 6 10*3/uL (ref 4.0–10.5)
nRBC: 0 % (ref 0.0–0.2)

## 2022-10-11 LAB — VITAMIN B12: Vitamin B-12: 263 pg/mL (ref 180–914)

## 2022-10-11 LAB — IRON AND TIBC
Iron: 106 ug/dL (ref 28–170)
Saturation Ratios: 40 % — ABNORMAL HIGH (ref 10.4–31.8)
TIBC: 267 ug/dL (ref 250–450)
UIBC: 161 ug/dL

## 2022-10-11 LAB — FERRITIN: Ferritin: 145 ng/mL (ref 11–307)

## 2022-10-12 ENCOUNTER — Encounter: Payer: Self-pay | Admitting: Internal Medicine

## 2022-10-12 NOTE — Progress Notes (Signed)
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  She has a history of iron and B12 deficiency anemia secondary to it.  She self administers B12 injections at home she has received both Feraheme and Venofer in the past.   Colonoscopy on 05/20/2016 revealed the sigmoid colon, descending colon and transverse colon were significantly redundant. There was some mild mucosal irritation at about 35-40 cm on withdrawal.  EGD on 10/12/2018 revealed a variable Z-line variable. There was LA Grade B erosive esophagitis. 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  There was bile gastritis. Roux-en-Y gastrojejunostomy with gastrojejunal anastomosis was characterized by an intact staple line and healthy appearing mucosa.    Interval history-patient recently received IV iron reports improvement in her energy level since then.  ECOG PS- 1 Pain scale- 0   Review of systems- Review of Systems  Constitutional:  Positive for malaise/fatigue. Negative for chills, fever and weight loss.  HENT:  Negative for congestion, ear discharge and nosebleeds.   Eyes:  Negative for blurred vision.  Respiratory:  Negative for cough, hemoptysis, sputum production, shortness of breath and wheezing.   Cardiovascular:  Negative for chest pain, palpitations, orthopnea and claudication.  Gastrointestinal:  Negative for abdominal  pain, blood in stool, constipation, diarrhea, heartburn, melena, nausea and vomiting.  Genitourinary:  Negative for dysuria, flank pain, frequency, hematuria and urgency.  Musculoskeletal:  Negative for back pain, joint pain and myalgias.  Skin:  Negative for rash.  Neurological:  Negative for dizziness, tingling, focal weakness, seizures, weakness and headaches.  Endo/Heme/Allergies:  Does not bruise/bleed easily.  Psychiatric/Behavioral:  Negative for depression and suicidal ideas. The patient does not have insomnia.       Allergies  Allergen Reactions   Prednisone Palpitations    Will take if needed     Past Medical History:  Diagnosis Date   Anemia    Anxiety    Arthritis    hands, fingers, lower back   Complication of anesthesia    loss control of bowels after block for hip replacement   Depression    Gastritis    GERD (gastroesophageal reflux disease)    Headache    hx of migraines in past   Hypertension    IDA (iron deficiency anemia) 03/17/2015   IDA (iron deficiency anemia) 03/17/2015   Neck pain    s/p fall approx 1 month ago.  Transient. "Random"     Past Surgical History:  Procedure Laterality Date   ABDOMINAL HYSTERECTOMY  2008   ANTERIOR LATERAL LUMBAR FUSION WITH PERCUTANEOUS SCREW 1 LEVEL N/A 11/22/2020   Procedure: L3-4 LATERAL INTERBODY FUSION WITH PLATING;  Surgeon: Venetia Night, MD;  Location: [facility] ORS;  Service: Neurosurgery;  Laterality: N/A;  3rd case   APPENDECTOMY     BACK SURGERY  03/14/14   L4-5 laminectomy.  Dr Dutch Quint, [facility]   CHOLECYSTECTOMY     COLONOSCOPY     COLONOSCOPY WITH PROPOFOL N/A 05/13/2016   Procedure:  COLONOSCOPY WITH PROPOFOL;  Surgeon: Lollie Sails, MD;  Location: [facility] ENDOSCOPY;  Service: Endoscopy;  Laterality: N/A;   COLONOSCOPY WITH PROPOFOL N/A 05/20/2016   Procedure: COLONOSCOPY WITH PROPOFOL;  Surgeon: Lollie Sails, MD;  Location: [facility] ENDOSCOPY;  Service: Endoscopy;  Laterality: N/A;    ESOPHAGOGASTRODUODENOSCOPY (EGD) WITH PROPOFOL N/A 05/13/2016   Procedure: ESOPHAGOGASTRODUODENOSCOPY (EGD) WITH PROPOFOL;  Surgeon: Lollie Sails, MD;  Location: [facility] ENDOSCOPY;  Service: Endoscopy;  Laterality: N/A;   ESOPHAGOGASTRODUODENOSCOPY (EGD) WITH PROPOFOL N/A 10/12/2018   Procedure: ESOPHAGOGASTRODUODENOSCOPY (EGD) WITH PROPOFOL;  Surgeon: Lollie Sails, MD;  Location: [facility] ENDOSCOPY;  Service: Endoscopy;  Laterality: N/A;   GASTRIC BYPASS  2002   JOINT REPLACEMENT Left 12/2013   hip replacement at [place] Left 03/07/2016   Procedure: 1. Partial plantar fascial release with endoscopic procedure   2. Topaz fasciotomy percutaneously;  Surgeon: Albertine Patricia, DPM;  Location: [place];  Service: Podiatry;  Laterality: Left;  LMA WITH POPLITEAL TOPAZ   TONSILLECTOMY      Social History   Socioeconomic History   Marital status: Married    Spouse name: Not on file   Number of children: 1   Years of education: 12   Highest education level: High school graduate  Occupational History   Occupation: Disabled  Tobacco Use   Smoking status: Never   Smokeless tobacco: Never  Vaping Use   Vaping Use: Never used  Substance and Sexual Activity   Alcohol use: Not Currently   Drug use: No   Sexual activity: Yes  Other Topics Concern   Not on file  Social History Narrative   Lives at home with husband.   Right-handed.   No caffeine use.   Social Determinants of Health   Financial Resource Strain: Not on file  Food Insecurity: Not on file  Transportation Needs: Not on file  Physical Activity: Not on file  Stress: Not on file  Social Connections: Not on file  Intimate Partner Violence: Not on file    Family History  Problem Relation Age of Onset   Diabetes Mother    Stroke Mother    Heart attack Father        died at age 75   Heart disease Father    Macular degeneration Maternal Grandmother    Dementia Maternal Grandmother     Prostate cancer Maternal Grandfather    Heart attack Paternal Grandmother    Heart disease Paternal Grandmother    Heart attack Paternal Grandfather    Heart disease Paternal Grandfather    Breast cancer Neg Hx      Current Outpatient Medications:    aspirin 81 MG tablet, Take 81 mg by mouth daily., Disp: , Rfl:    Calcium Carbonate-Vitamin D 600-400 MG-UNIT tablet, Take 2 tablets by mouth 2 (two) times daily., Disp: , Rfl:    Coenzyme Q10 100 MG capsule, Take 100 mg by mouth daily., Disp: , Rfl:    cyanocobalamin (,VITAMIN B-12,) 1000 MCG/ML injection, Inject 1 mL (1,000 mcg total) into the muscle every 30 (thirty) days., Disp: 3 mL, Rfl: 9   fexofenadine (ALLEGRA) 180 MG tablet, Take 180 mg by mouth daily as needed for allergies., Disp: , Rfl:    fluticasone (FLONASE) 50 MCG/ACT nasal spray, Place 2 sprays into both nostrils daily., Disp: 16 g, Rfl: 0   gabapentin (NEURONTIN) 600 MG tablet, Take 600 mg by mouth 2 (two) times daily., Disp: , Rfl:    hyoscyamine (  LEVSIN SL) 0.125 MG SL tablet, Place under the tongue every 6 (six) hours as needed., Disp: , Rfl:    lipase/protease/amylase (CREON) 12000-38000 units CPEP capsule, Take by mouth., Disp: , Rfl:    losartan (COZAAR) 50 MG tablet, Take 50 mg by mouth daily., Disp: , Rfl:    meloxicam (MOBIC) 15 MG tablet, Take 1 tablet by mouth daily., Disp: , Rfl:    methocarbamol (ROBAXIN) 500 MG tablet, Take 1 tablet by mouth every 6 (six) hours as needed., Disp: , Rfl:    Multiple Vitamin (MULTIVITAMIN) tablet, Take 1 tablet by mouth daily., Disp: , Rfl:    Omega-3 Fatty Acids (FISH OIL) 1200 MG CAPS, Take 1,200 mg by mouth daily. , Disp: , Rfl:    pantoprazole (PROTONIX) 40 MG tablet, Take 40 mg by mouth 2 (two) times daily. , Disp: , Rfl: 2   polyethylene glycol-electrolytes (NULYTELY) 420 g solution, Use as directed for colon cleanse., Disp: , Rfl:    sertraline (ZOLOFT) 100 MG tablet, Take 1 tablet (100 mg total) by mouth daily., Disp: 90  tablet, Rfl: 1   sucralfate (CARAFATE) 1 g tablet, Take 1 g by mouth 2 (two) times daily. , Disp: , Rfl:    Vitamin D, Ergocalciferol, (DRISDOL) 50000 units CAPS capsule, Take 50,000 Units by mouth 2 (two) times a week., Disp: , Rfl: 3  Physical exam:  Vitals:   10/11/22 1411  BP: (!) 124/94  Pulse: 79  Resp: 16  Temp: 98.7 F (37.1 C)  SpO2: 99%  Weight: 203 lb (92.1 kg)   Physical Exam Constitutional:      General: She is not in acute distress. Cardiovascular:     Rate and Rhythm: Normal rate and regular rhythm.     Heart sounds: Normal heart sounds.  Pulmonary:     Effort: Pulmonary effort is normal.  Skin:    General: Skin is warm and dry.  Neurological:     Mental Status: She is alert and oriented to person, place, and time.         Latest Ref Rng & Units 07/11/2021    9:10 AM  CMP  Glucose 70 - 99 mg/dL 99   BUN 8 - 23 mg/dL 16   Creatinine 0.44 - 1.00 mg/dL 0.47   Sodium 135 - 145 mmol/L 137   Potassium 3.5 - 5.1 mmol/L 3.8   Chloride 98 - 111 mmol/L 106   CO2 22 - 32 mmol/L 27   Calcium 8.9 - 10.3 mg/dL 8.5       Latest Ref Rng & Units 10/11/2022    1:39 PM  CBC  WBC 4.0 - 10.5 K/uL 6.0   Hemoglobin 12.0 - 15.0 g/dL 12.7   Hematocrit 36.0 - 46.0 % 38.7   Platelets 150 - 400 K/uL 188     No images are attached to the encounter.  No results found.   Assessment and plan- Patient is a 62 y.o. 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  She has not been anemic throughout this time.  And her hemoglobin presently stable at 12.7.  Iron studies are normal at 145 of ferritin and iron saturation of 40%.  She does not require iron at this time.  B12 levels are mildly low at 263 and she is on monthly B12 injections which she will continue.  I will repeat CBC ferritin and iron studies  in 3 and 6 months and I will see her back in 6 months.  I will  also check B12 levels in 6 months   Visit Diagnosis 1. Iron deficiency anemia following bariatric surgery   2. B12 deficiency      Dr. Owens Shark, MD, MPH [facility] [phone] 10/12/2022 8:25 AM

## 2022-10-14 ENCOUNTER — Telehealth: Payer: Self-pay

## 2022-10-14 NOTE — Telephone Encounter (Signed)
Spoke to patient informed her no IV iron per MD at this time

## 2022-10-14 NOTE — Telephone Encounter (Signed)
-----  Message from Sindy Guadeloupe, MD sent at 10/11/2022  4:34 PM EST ----- Please let her know that IV iron is not needed at this time

## 2022-11-13 ENCOUNTER — Ambulatory Visit
Admission: EM | Admit: 2022-11-13 | Discharge: 2022-11-13 | Disposition: A | Discharge status: Home / Self Care | Payer: Medicare Other | Attending: Physician Assistant | Admitting: Physician Assistant

## 2022-11-13 DIAGNOSIS — W5501XA Bitten by cat, initial encounter: Secondary | ICD-10-CM | POA: Diagnosis not present

## 2022-11-13 DIAGNOSIS — L03113 Cellulitis of right upper limb: Secondary | ICD-10-CM

## 2022-11-13 MED ORDER — HYDROCODONE-ACETAMINOPHEN 5-325 MG PO TABS: 1.0000 | ORAL_TABLET | Freq: Four times a day (QID) | ORAL | 0 refills | Status: AC | PRN

## 2022-11-13 MED ORDER — CEFTRIAXONE SODIUM 1 G IJ SOLR
1.0000 g | Freq: Once | INTRAMUSCULAR | Status: AC
  Administered 2022-11-13: 1 g via INTRAMUSCULAR

## 2022-11-13 MED ORDER — AMOXICILLIN-POT CLAVULANATE 875-125 MG PO TABS: 1.0000 | ORAL_TABLET | Freq: Two times a day (BID) | ORAL | 0 refills | Status: AC

## 2022-11-13 NOTE — ED Triage Notes (Signed)
Called once from lobby to no answer.

## 2022-11-13 NOTE — ED Triage Notes (Signed)
Pt was bit by her siamese cat yesterday. Pt states that the cat is new and stressed.  Pt took her husbands oxycodone for pain - Pt states she only took 1 pill.  Pt has been taking old amoxicillin left over from a dental procedure. Pt took 1 435m tablet.

## 2022-11-13 NOTE — ED Provider Notes (Signed)
[facility]    CSN: [id number] Arrival date & time: 11/13/22  [id number]      History   Chief Complaint Chief Complaint  Patient presents with   Animal Bite   Hand Problem    HPI [name] is a 62 y.o. female presenting for pain, swelling, redness and increased warmth of the right hand since yesterday, worsening today.  Patient reports she recently got a new cat a few weeks ago.  Reports the cat bit her and scratched her hand.  She says that she took 1 dose of amoxicillin yesterday after it happened.  Reports was a 400 mg tablet.  Patient reports increased pain by the time she went to bed last night so she took one of her husbands oxycodone tablets.  She denies any fevers.  She reports increased swelling and pain upon waking up today.  She says the animal is up-to-date with its rabies and other immunizations.  She is not wanting to report the incident at this time.  Reports that Was a stray and she took it in a few weeks ago.  States she has 16 cats.  She has no other complaints.  HPI  Past Medical History:  Diagnosis Date   Anemia    Anxiety    Arthritis    hands, fingers, lower back   Complication of anesthesia    loss control of bowels after block for hip replacement   Depression    Gastritis    GERD (gastroesophageal reflux disease)    Headache    hx of migraines in past   Hypertension    IDA (iron deficiency anemia) 03/17/2015   IDA (iron deficiency anemia) 03/17/2015   Neck pain    s/p fall approx 1 month ago.  Transient. 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  Dr Trenton Gammon, [facility]   CHOLECYSTECTOMY     COLONOSCOPY     COLONOSCOPY WITH PROPOFOL N/A 05/13/2016   Procedure: COLONOSCOPY WITH PROPOFOL;  Surgeon: Lollie Sails, MD;  Location: [facility] ENDOSCOPY;  Service: Endoscopy;  Laterality: N/A;   COLONOSCOPY WITH PROPOFOL N/A 05/20/2016   Procedure: COLONOSCOPY WITH PROPOFOL;  Surgeon: Lollie Sails, MD;  Location: [facility] ENDOSCOPY;  Service: Endoscopy;  Laterality: N/A;   ESOPHAGOGASTRODUODENOSCOPY (EGD) WITH PROPOFOL N/A 05/13/2016   Procedure: ESOPHAGOGASTRODUODENOSCOPY (EGD) WITH PROPOFOL;  Surgeon: Lollie Sails, MD;  Location: [facility] ENDOSCOPY;  Service: Endoscopy;  Laterality: N/A;   ESOPHAGOGASTRODUODENOSCOPY (EGD) WITH PROPOFOL N/A 10/12/2018   Procedure: ESOPHAGOGASTRODUODENOSCOPY (EGD) WITH PROPOFOL;  Surgeon: Lollie Sails, MD;  Location: [facility] ENDOSCOPY;  Service: Endoscopy;  Laterality: N/A;   GASTRIC BYPASS  2002   JOINT REPLACEMENT Left 12/2013   hip replacement at [facility]  PLANTAR FASCIA RELEASE Left 03/07/2016   Procedure: 1. Partial plantar fascial release with endoscopic procedure   2. Topaz fasciotomy percutaneously;  Surgeon: Albertine Patricia, DPM;  Location: [place];  Service: Podiatry;  Laterality: Left;  LMA WITH POPLITEAL TOPAZ   TONSILLECTOMY      OB History   No obstetric history on file.      Home Medications    Prior to Admission medications   Medication Sig Start Date End Date Taking? Authorizing Provider  aspirin 81 MG tablet Take 81 mg by mouth daily.   Yes [provider]  Calcium  Carbonate-Vitamin D 600-400 MG-UNIT tablet Take 2 tablets by mouth 2 (two) times daily.   Yes [provider]  Coenzyme Q10 100 MG capsule Take 100 mg by mouth daily.   Yes [provider]  cyanocobalamin (,VITAMIN B-12,) 1000 MCG/ML injection Inject 1 mL (1,000 mcg total) into the muscle every 30 (thirty) days. 03/04/22  Yes Sindy Guadeloupe, MD  fexofenadine (ALLEGRA) 180 MG tablet Take 180 mg by mouth daily as needed for allergies.   Yes [provider]  fluticasone (FLONASE) 50 MCG/ACT nasal spray Place 2 sprays into both nostrils daily. 08/04/22  Yes Cook, Jayce G, DO  gabapentin (NEURONTIN) 600 MG tablet Take 600 mg by mouth 2 (two) times daily.   Yes [provider]  HYDROcodone-acetaminophen (NORCO/VICODIN) 5-325 MG tablet Take 1 tablet by mouth every 6 (six) hours as needed for up to 2 days for severe pain. 11/13/22 11/15/22 Yes Danton Clap, PA-C  hyoscyamine (LEVSIN SL) 0.125 MG SL tablet Place under the tongue every 6 (six) hours as needed. 12/26/20  Yes [provider]  lipase/protease/amylase (CREON) 12000-38000 units CPEP capsule Take by mouth. 06/18/22  Yes [provider]  losartan (COZAAR) 50 MG tablet Take 50 mg by mouth daily. 11/20/16  Yes [provider]  meloxicam (MOBIC) 15 MG tablet Take 1 tablet by mouth daily. 12/26/20  Yes [provider]  methocarbamol (ROBAXIN) 500 MG tablet Take 1 tablet by mouth every 6 (six) hours as needed. 01/01/21  Yes [provider]  Multiple Vitamin (MULTIVITAMIN) tablet Take 1 tablet by mouth daily.   Yes [provider]  Omega-3 Fatty Acids (FISH OIL) 1200 MG CAPS Take 1,200 mg by mouth daily.    Yes [provider]  pantoprazole (PROTONIX) 40 MG tablet Take 40 mg by mouth 2 (two) times daily.  02/27/18  Yes [provider]  sertraline (ZOLOFT) 100 MG tablet Take 1 tablet (100 mg total) by mouth daily. 04/11/18  Yes Cook, Jayce G, DO  sucralfate  (CARAFATE) 1 g tablet Take 1 g by mouth 2 (two) times daily.    Yes [provider]  Vitamin D, Ergocalciferol, (DRISDOL) 50000 units CAPS capsule Take 50,000 Units by mouth 2 (two) times a week. 05/30/18  Yes [provider]  polyethylene glycol-electrolytes (NULYTELY) 420 g solution Use as directed for colon cleanse. 12/26/20   [provider]  escitalopram (LEXAPRO) 10 MG tablet Take 10 mg by mouth daily.  02/24/21  [provider]    Family History Family History  Problem Relation Age of Onset   Diabetes Mother    Stroke Mother    Heart attack Father        died at age 79   Heart disease Father    Macular degeneration Maternal Grandmother    Dementia Maternal Grandmother    Prostate cancer Maternal Grandfather  Heart attack Paternal Grandmother    Heart disease Paternal Grandmother    Heart attack Paternal Grandfather    Heart disease Paternal Grandfather    Breast cancer Neg Hx     Social History Social History   Tobacco Use   Smoking status: Never   Smokeless tobacco: Never  Vaping Use   Vaping Use: Never used  Substance Use Topics   Alcohol use: Not Currently   Drug use: No     Allergies   Prednisone   Review of Systems Review of Systems  Constitutional:  Negative for fatigue and fever.  Musculoskeletal:  Positive for arthralgias and joint swelling.  Skin:  Positive for color change and wound.  Neurological:  Negative for weakness and numbness.     Physical Exam Triage Vital Signs ED Triage Vitals  Enc Vitals Group     BP 11/13/22 1059 123/88     Pulse Rate 11/13/22 1059 69     Resp --      Temp 11/13/22 1059 98.8 F (37.1 C)     Temp Source 11/13/22 1059 Oral     SpO2 11/13/22 1059 99 %     Weight 11/13/22 1058 186 lb (84.4 kg)     Height 11/13/22 1058 5' 3"$  (1.6 m)     Head Circumference --      Peak Flow --      Pain Score 11/13/22 1057 10     Pain Loc --      Pain Edu? --      Excl. in [place]? --    No data  found.  Updated Vital Signs BP 123/88 (BP Location: Left Arm)   Pulse 69   Temp 98.8 F (37.1 C) (Oral)   Ht 5' 3"$  (1.6 m)   Wt 186 lb (84.4 kg)   SpO2 99%   BMI 32.95 kg/m    Physical Exam Vitals and nursing note reviewed.  Constitutional:      General: She is not in acute distress.    Appearance: Normal appearance. She is not ill-appearing or toxic-appearing.  HENT:     Head: Normocephalic and atraumatic.  Eyes:     General: No scleral icterus.       Right eye: No discharge.        Left eye: No discharge.     Conjunctiva/sclera: Conjunctivae normal.  Cardiovascular:     Rate and Rhythm: Normal rate and regular rhythm.     Pulses: Normal pulses.  Pulmonary:     Effort: Pulmonary effort is normal. No respiratory distress.  Musculoskeletal:     Cervical back: Neck supple.  Skin:    General: Skin is dry.     Comments: She has a couple punctures/abrasions and significant erythema, swelling and increased warmth of the right dorsal hand into the wrist.  Full movement of the wrist.  Normal pulses.  Diffuse tenderness palpation of the dorsal hand and wrist.   Neurological:     General: No focal deficit present.     Mental Status: She is alert. Mental status is at baseline.     Motor: No weakness.     Gait: Gait normal.  Psychiatric:        Mood and Affect: Mood normal.        Behavior: Behavior normal.        Thought Content: Thought content normal.         UC Treatments / Results  Labs (all labs ordered are listed, but only abnormal  results are displayed) Labs Reviewed - No data to display  EKG   Radiology No results found.  Procedures Procedures (including critical care time)  Medications Ordered in UC Medications  cefTRIAXone (ROCEPHIN) injection 1 g (1 g Intramuscular Given 11/13/22 1138)    Initial Impression / Assessment and Plan / UC Course  I have reviewed the triage vital signs and the nursing notes.  Pertinent labs & imaging results that  were available during my care of the patient were reviewed by me and considered in my medical decision making (see chart for details).   62 year old female presents for right hand pain, swelling after cat bite that occurred yesterday.  The cat is up-to-date with its rabies and other immunizations.  She is afebrile and overall well-appearing.  I have included an image in the chart of her hand.  She has a couple punctures/abrasions and significant erythema, swelling and increased warmth of the right dorsal hand into the wrist.  Full movement of the wrist.  Normal pulses.  Diffuse tenderness.  Patient given 1 g IM Rocephin in clinic for cellulitis post animal bite.  Will have her start Augmentin later this evening.  Also sent Norco as needed for pain relief.  Reviewed RICE guidelines.  I drew a line around the area of redness and advised her to go to ER if the redness or swelling spread outside the outline or she develops a fever or worsening pain.  Advised patient of the importance of very close monitoring and low threshold to go to the ER if symptoms are worsening or not improving.   Final Clinical Impressions(s) / UC Diagnoses   Final diagnoses:  Cellulitis of right hand  Cat bite, initial encounter     Discharge Instructions      -You have a significant infection due to cat bite. - We have given you an injection of a antibiotic in the clinic. - I sent antibiotics to the pharmacy.  Take the first dose of this later on this evening. - Ice and elevate - I sent some for pain as well. - If you notice the redness and swelling are spreading outside of the lines that I have drawn or you develop fever or worsening pain he will need to go immediately to the emergency department.     ED Prescriptions     Medication Sig Dispense Auth. Provider   HYDROcodone-acetaminophen (NORCO/VICODIN) 5-325 MG tablet Take 1 tablet by mouth every 6 (six) hours as needed for up to 2 days for severe pain. 6  tablet Danton Clap, PA-C      I have reviewed the PDMP during this encounter.   Danton Clap, PA-C 11/13/22 1144

## 2022-11-13 NOTE — Discharge Instructions (Addendum)
-  You have a significant infection due to cat bite. - We have given you an injection of a antibiotic in the clinic. - I sent antibiotics to the pharmacy.  Take the first dose of this later on this evening. - Ice and elevate - I sent some for pain as well. - If you notice the redness and swelling are spreading outside of the lines that I have drawn or you develop fever or worsening pain he will need to go immediately to the emergency department.

## 2023-01-10 ENCOUNTER — Inpatient Hospital Stay: Payer: Medicare Other | Attending: Oncology

## 2023-02-05 ENCOUNTER — Other Ambulatory Visit: Payer: Self-pay | Admitting: Family Medicine

## 2023-02-05 DIAGNOSIS — Z1231 Encounter for screening mammogram for malignant neoplasm of breast: Secondary | ICD-10-CM

## 2023-03-03 ENCOUNTER — Ambulatory Visit
Admission: RE | Admit: 2023-03-03 | Discharge: 2023-03-03 | Disposition: A | Discharge status: Home / Self Care | Payer: Medicare Other | Source: Ambulatory Visit | Attending: Family Medicine | Admitting: Family Medicine

## 2023-03-03 DIAGNOSIS — Z1231 Encounter for screening mammogram for malignant neoplasm of breast: Secondary | ICD-10-CM | POA: Diagnosis present

## 2023-03-06 ENCOUNTER — Other Ambulatory Visit: Payer: Self-pay | Admitting: Oncology

## 2023-03-06 ENCOUNTER — Telehealth: Payer: Self-pay | Admitting: *Deleted

## 2023-03-06 ENCOUNTER — Other Ambulatory Visit: Payer: Self-pay | Admitting: *Deleted

## 2023-03-06 DIAGNOSIS — E538 Deficiency of other specified B group vitamins: Secondary | ICD-10-CM

## 2023-03-06 DIAGNOSIS — K9589 Other complications of other bariatric procedure: Secondary | ICD-10-CM

## 2023-03-06 NOTE — Telephone Encounter (Signed)
Pt. Called and wanted to know if she can come and get her labs, she is felling fatigued. She is getting face lift soon and wants to make sure she feels good. The pt. Missed her last lab in April. I made an lab encounter for 6/14. 10:15. Pt aware of this and labs entered

## 2023-03-07 ENCOUNTER — Inpatient Hospital Stay: Payer: Medicare Other | Attending: Oncology

## 2023-03-07 DIAGNOSIS — Z9884 Bariatric surgery status: Secondary | ICD-10-CM | POA: Diagnosis not present

## 2023-03-07 DIAGNOSIS — E538 Deficiency of other specified B group vitamins: Secondary | ICD-10-CM | POA: Diagnosis present

## 2023-03-07 DIAGNOSIS — D508 Other iron deficiency anemias: Secondary | ICD-10-CM | POA: Diagnosis present

## 2023-03-07 LAB — CBC
HCT: 39.2 % (ref 36.0–46.0)
Hemoglobin: 12.5 g/dL (ref 12.0–15.0)
MCH: 31.4 pg (ref 26.0–34.0)
MCHC: 31.9 g/dL (ref 30.0–36.0)
MCV: 98.5 fL (ref 80.0–100.0)
Platelets: 195 10*3/uL (ref 150–400)
RBC: 3.98 MIL/uL (ref 3.87–5.11)
RDW: 12.5 % (ref 11.5–15.5)
WBC: 5.1 10*3/uL (ref 4.0–10.5)
nRBC: 0 % (ref 0.0–0.2)

## 2023-03-07 LAB — IRON AND TIBC
Iron: 91 ug/dL (ref 28–170)
Saturation Ratios: 31 % (ref 10.4–31.8)
TIBC: 293 ug/dL (ref 250–450)
UIBC: 202 ug/dL

## 2023-03-07 LAB — FERRITIN: Ferritin: 81 ng/mL (ref 11–307)

## 2023-03-10 ENCOUNTER — Telehealth: Payer: Self-pay

## 2023-03-10 ENCOUNTER — Ambulatory Visit: Payer: Medicare Other

## 2023-03-10 ENCOUNTER — Encounter: Payer: Self-pay | Admitting: Internal Medicine

## 2023-03-10 DIAGNOSIS — K222 Esophageal obstruction: Secondary | ICD-10-CM | POA: Diagnosis not present

## 2023-03-10 DIAGNOSIS — K219 Gastro-esophageal reflux disease without esophagitis: Secondary | ICD-10-CM | POA: Diagnosis not present

## 2023-03-10 DIAGNOSIS — R1310 Dysphagia, unspecified: Secondary | ICD-10-CM | POA: Diagnosis not present

## 2023-03-10 DIAGNOSIS — Z9889 Other specified postprocedural states: Secondary | ICD-10-CM | POA: Diagnosis not present

## 2023-03-10 NOTE — Telephone Encounter (Signed)
Left a vm for the patient to call back. 

## 2023-03-10 NOTE — Telephone Encounter (Signed)
Ferritin of 86 is not low. She is not anemic and her iron studies are normal. She does not require iv iron before surgery.keep labs next month as planned

## 2023-03-10 NOTE — Telephone Encounter (Signed)
Patient called stating she is having surgrey in the next couple weeks and with a ferritin being at 41 she believes she will need a few iron treatments before surgrey so she doesn't bottom out. Patient also states she is starting to feel bad again and knows when she feels bad her iron is low and it is time for a treatment, Patient is also requesting for Blood work every 2-3 months with possible treatment. Patient does not like going long and not knowing her results and if she needs treatment or not.

## 2023-03-11 ENCOUNTER — Telehealth: Payer: Self-pay | Admitting: *Deleted

## 2023-03-11 NOTE — Telephone Encounter (Signed)
Spoke with the patient and gave her instructions per Dr. Smith Robert, she verbalized understanding.

## 2023-03-11 NOTE — Telephone Encounter (Signed)
We cannot give iron infusions for fatigue. Her iron level is presently normal so she does not need iv iron before surgery in anticipation of surgery

## 2023-03-11 NOTE — Telephone Encounter (Signed)
Patient called stating that she is having surgery week after next and will be incapacitated for 8 weeks which will be when she would come in for Fe infusion so she is asking if she can come in for infusion beofre as she is already feeling fatigued. Please advise

## 2023-03-11 NOTE — Telephone Encounter (Signed)
Patient called back wanting answers as to why she cannot be kept in the middle of the normal range like when she saw Dr Sherrlyn Hock. She felt the best when he did that. She said how do you know what is normal for her. And is tired of not feeling well. Please call her to discuss.as she is not happy with this response.She feels like she is not being listened to. Is it due to insurance or what are you basing it on

## 2023-03-11 NOTE — Telephone Encounter (Signed)
Brooke- You can try speaking to the patient. Normal values for iron are not decided patient to patient. We do give some leeway but I feel her levels are normal based on reference ranges. We have certain guidelines we follow and based on that I dont feel she needs iv iron. If she is not happy with my response and wants to see another provider in my practice I am ok with that.

## 2023-03-11 NOTE — Telephone Encounter (Signed)
duplicate

## 2023-03-14 ENCOUNTER — Encounter: Payer: Self-pay | Admitting: Internal Medicine

## 2023-03-24 ENCOUNTER — Encounter: Payer: Self-pay | Admitting: Internal Medicine

## 2023-04-14 NOTE — Progress Notes (Unsigned)
Referring Physician:  No referring provider defined for this encounter.  Primary Physician:  [name], MD  History of Present Illness: 04/14/2023 Ms. [name] is here today with a chief complaint of ***  Lower back pain and left leg pain  Duration: *** Location: *** Quality: *** Severity: ***  Precipitating: aggravated by ***walking, bending, lifting, twisting, laying down, stairs  Modifying factors: made better by ***sitting, laying in bed, resting.  Weakness: none Timing: *** Bowel/Bladder Dysfunction: none  Conservative measures:  Physical therapy: *** none Multimodal medical therapy including regular antiinflammatories: *** gabapentin, meloxicam, methocarbamol Injections: *** epidural steroid injections 06/12/2020: Left L3-4 TF ESI (25% improvement)   Past Surgery: ***  11/22/20 L3-4 lateral interbody fusion with plating  Cloyd Stagers has ***no symptoms of cervical myelopathy.  The symptoms are causing a significant impact on the patient's life.   Review of Systems:  A 10 point review of systems is negative, except for the pertinent positives and negatives detailed in the HPI.  Past Medical History: Past Medical History:  Diagnosis Date   Anemia    Anxiety    Arthritis    hands, fingers, lower back   Complication of anesthesia    loss control of bowels after block for hip replacement   Depression    Gastritis    GERD (gastroesophageal reflux disease)    Headache    hx of migraines in past   Hypertension    IDA (iron deficiency anemia) 03/17/2015   IDA (iron deficiency anemia) 03/17/2015   Neck pain    s/p fall approx 1 month ago.  Transient. "Random"    Past Surgical History: Past Surgical History:  Procedure Laterality Date   ABDOMINAL HYSTERECTOMY  2008   ANTERIOR LATERAL LUMBAR FUSION WITH PERCUTANEOUS SCREW 1 LEVEL N/A 11/22/2020   Procedure: L3-4 LATERAL INTERBODY FUSION WITH PLATING;  Surgeon: Venetia Night, MD;   Location: [facility] ORS;  Service: Neurosurgery;  Laterality: N/A;  3rd case   APPENDECTOMY     BACK SURGERY  03/14/14   L4-5 laminectomy.  Dr Dutch Quint, [facility]   CHOLECYSTECTOMY     COLONOSCOPY     COLONOSCOPY WITH PROPOFOL N/A 05/13/2016   Procedure: COLONOSCOPY WITH PROPOFOL;  Surgeon: [name], MD;  Location: [facility] ENDOSCOPY;  Service: Endoscopy;  Laterality: N/A;   COLONOSCOPY WITH PROPOFOL N/A 05/20/2016   Procedure: COLONOSCOPY WITH PROPOFOL;  Surgeon: [name], MD;  Location[facility] ENDOSCOPY;  Service: Endoscopy;  Laterality: N/A;   ESOPHAGOGASTRODUODENOSCOPY (EGD) WITH PROPOFOL N/A 05/13/2016   Procedure: ESOPHAGOGASTRODUODENOSCOPY (EGD) WITH PROPOFOL;  Surgeon: [name], MD;  Location: [facility] ENDOSCOPY;  Service: Endoscopy;  Laterality: N/A;   ESOPHAGOGASTRODUODENOSCOPY (EGD) WITH PROPOFOL N/A 10/12/2018   Procedure: ESOPHAGOGASTRODUODENOSCOPY (EGD) WITH PROPOFOL;  Surgeon: [name], MD;  Location: [facility] ENDOSCOPY;  Service: Endoscopy;  Laterality: N/A;   GASTRIC BYPASS  2002   JOINT REPLACEMENT Left 12/2013   hip replacement at [facility]   PLANTAR FASCIA RELEASE Left 03/07/2016   Procedure: 1. Partial plantar fascial release with endoscopic procedure   2. Topaz fasciotomy percutaneously;  Surgeon: Recardo Evangelist, DPM;  Location: [facility];  Service: Podiatry;  Laterality: Left;  LMA WITH POPLITEAL TOPAZ   TONSILLECTOMY      Allergies: Allergies as of 04/17/2023 - Review Complete 11/13/2022  Allergen Reaction Noted   Prednisone Palpitations 04/23/2018    Medications: Outpatient Encounter Medications as of 04/17/2023  Medication Sig   aspirin 81 MG tablet Take 81 mg by mouth  daily.   Calcium Carbonate-Vitamin D 600-400 MG-UNIT tablet Take 2 tablets by mouth 2 (two) times daily.   Coenzyme Q10 100 MG capsule Take 100 mg by mouth daily.   cyanocobalamin (VITAMIN B12) 1000 MCG/ML injection INJECT 1 ML (1,000 MCG TOTAL) INTO THE MUSCLE EVERY 30 DAYS.    fexofenadine (ALLEGRA) 180 MG tablet Take 180 mg by mouth daily as needed for allergies.   fluticasone (FLONASE) 50 MCG/ACT nasal spray Place 2 sprays into both nostrils daily.   gabapentin (NEURONTIN) 600 MG tablet Take 600 mg by mouth 2 (two) times daily.   hyoscyamine (LEVSIN SL) 0.125 MG SL tablet Place under the tongue every 6 (six) hours as needed.   lipase/protease/amylase (CREON) 12000-38000 units CPEP capsule Take by mouth.   losartan (COZAAR) 50 MG tablet Take 50 mg by mouth daily.   meloxicam (MOBIC) 15 MG tablet Take 1 tablet by mouth daily.   methocarbamol (ROBAXIN) 500 MG tablet Take 1 tablet by mouth every 6 (six) hours as needed.   Multiple Vitamin (MULTIVITAMIN) tablet Take 1 tablet by mouth daily.   Omega-3 Fatty Acids (FISH OIL) 1200 MG CAPS Take 1,200 mg by mouth daily.    pantoprazole (PROTONIX) 40 MG tablet Take 40 mg by mouth 2 (two) times daily.    polyethylene glycol-electrolytes (NULYTELY) 420 g solution Use as directed for colon cleanse.   sertraline (ZOLOFT) 100 MG tablet Take 1 tablet (100 mg total) by mouth daily.   sucralfate (CARAFATE) 1 g tablet Take 1 g by mouth 2 (two) times daily.    Vitamin D, Ergocalciferol, (DRISDOL) 50000 units CAPS capsule Take 50,000 Units by mouth 2 (two) times a week.   [DISCONTINUED] escitalopram (LEXAPRO) 10 MG tablet Take 10 mg by mouth daily.   No facility-administered encounter medications on file as of 04/17/2023.    Social History: Social History   Tobacco Use   Smoking status: Never   Smokeless tobacco: Never  Vaping Use   Vaping status: Never Used  Substance Use Topics   Alcohol use: Not Currently   Drug use: No    Family Medical History: Family History  Problem Relation Age of Onset   Diabetes Mother    Stroke Mother    Heart attack Father        died at age 74   Heart disease Father    Macular degeneration Maternal Grandmother    Dementia Maternal Grandmother    Prostate cancer Maternal Grandfather     Heart attack Paternal Grandmother    Heart disease Paternal Grandmother    Heart attack Paternal Grandfather    Heart disease Paternal Grandfather    Breast cancer Neg Hx     Physical Examination: @VITALWITHPAIN @  General: Patient is well developed, well nourished, calm, collected, and in no apparent distress. Attention to examination is appropriate.  Psychiatric: Patient is non-anxious.  Head:  Pupils equal, round, and reactive to light.  ENT:  Oral mucosa appears well hydrated.  Neck:   Supple.  ***Full range of motion.  Respiratory: Patient is breathing without any difficulty.  Extremities: No edema.  Vascular: Palpable dorsal pedal pulses.  Skin:   On exposed skin, there are no abnormal skin lesions.  NEUROLOGICAL:     Awake, alert, oriented to person, place, and time.  Speech is clear and fluent. Fund of knowledge is appropriate.   Cranial Nerves: Pupils equal round and reactive to light.  Facial tone is symmetric.  Facial sensation is symmetric.  ROM of spine: ***full.  Palpation of spine: ***non tender.    Strength: Side Biceps Triceps Deltoid Interossei Grip Wrist Ext. Wrist Flex.  R 5 5 5 5 5 5 5   L 5 5 5 5 5 5 5    Side Iliopsoas Quads Hamstring PF DF EHL  R 5 5 5 5 5 5   L 5 5 5 5 5 5    Reflexes are ***2+ and symmetric at the biceps, triceps, brachioradialis, patella and achilles.   Hoffman's is absent.  Clonus is not present.  Toes are down-going.  Bilateral upper and lower extremity sensation is intact to light touch.    Gait is normal.   No difficulty with tandem gait.   No evidence of dysmetria noted.  Medical Decision Making  Imaging: ***  I have personally reviewed the images and agree with the above interpretation.  Assessment and Plan: Ms. Tiede is a pleasant 62 y.o. female with ***    Thank you for involving me in the care of this patient.   I spent a total of *** minutes in both face-to-face and non-face-to-face activities for  this visit on the date of this encounter.   [facility] of Neurosurgery

## 2023-04-15 ENCOUNTER — Inpatient Hospital Stay: Payer: Medicare Other | Admitting: Oncology

## 2023-04-15 ENCOUNTER — Inpatient Hospital Stay: Payer: Medicare Other

## 2023-04-17 ENCOUNTER — Ambulatory Visit
Admission: RE | Admit: 2023-04-17 | Discharge: 2023-04-17 | Disposition: A | Discharge status: Home / Self Care | Payer: Medicare Other | Source: Ambulatory Visit | Attending: Neurosurgery | Admitting: Neurosurgery

## 2023-04-17 ENCOUNTER — Encounter: Payer: Self-pay | Admitting: Neurosurgery

## 2023-04-17 ENCOUNTER — Ambulatory Visit (INDEPENDENT_AMBULATORY_CARE_PROVIDER_SITE_OTHER): Payer: Medicare Other | Admitting: Neurosurgery

## 2023-04-17 VITALS — BP 128/84 | Wt 203.0 lb

## 2023-04-17 DIAGNOSIS — M542 Cervicalgia: Secondary | ICD-10-CM

## 2023-04-17 DIAGNOSIS — M25512 Pain in left shoulder: Secondary | ICD-10-CM | POA: Insufficient documentation

## 2023-04-17 DIAGNOSIS — E65 Localized adiposity: Secondary | ICD-10-CM

## 2023-06-23 ENCOUNTER — Inpatient Hospital Stay: Payer: Medicare Other | Admitting: Oncology

## 2023-06-23 ENCOUNTER — Inpatient Hospital Stay: Payer: Medicare Other | Attending: Oncology

## 2023-06-23 ENCOUNTER — Encounter: Payer: Self-pay | Admitting: Oncology

## 2023-07-22 ENCOUNTER — Inpatient Hospital Stay (HOSPITAL_BASED_OUTPATIENT_CLINIC_OR_DEPARTMENT_OTHER): Payer: Medicare Other | Admitting: Oncology

## 2023-07-22 ENCOUNTER — Encounter: Payer: Self-pay | Admitting: Oncology

## 2023-07-22 ENCOUNTER — Inpatient Hospital Stay: Payer: Medicare Other | Attending: Oncology

## 2023-07-22 VITALS — BP 123/89 | HR 71 | Temp 97.2°F | Resp 18 | Ht 63.0 in | Wt 195.1 lb

## 2023-07-22 DIAGNOSIS — K9589 Other complications of other bariatric procedure: Secondary | ICD-10-CM

## 2023-07-22 DIAGNOSIS — Z9049 Acquired absence of other specified parts of digestive tract: Secondary | ICD-10-CM | POA: Diagnosis not present

## 2023-07-22 DIAGNOSIS — E538 Deficiency of other specified B group vitamins: Secondary | ICD-10-CM

## 2023-07-22 DIAGNOSIS — D508 Other iron deficiency anemias: Secondary | ICD-10-CM

## 2023-07-22 DIAGNOSIS — D509 Iron deficiency anemia, unspecified: Secondary | ICD-10-CM | POA: Diagnosis present

## 2023-07-22 DIAGNOSIS — D519 Vitamin B12 deficiency anemia, unspecified: Secondary | ICD-10-CM | POA: Insufficient documentation

## 2023-07-22 DIAGNOSIS — Z9884 Bariatric surgery status: Secondary | ICD-10-CM | POA: Diagnosis not present

## 2023-07-22 LAB — IRON AND TIBC
Iron: 120 ug/dL (ref 28–170)
Saturation Ratios: 39 % — ABNORMAL HIGH (ref 10.4–31.8)
TIBC: 305 ug/dL (ref 250–450)
UIBC: 185 ug/dL

## 2023-07-22 LAB — CBC
HCT: 41.2 % (ref 36.0–46.0)
Hemoglobin: 12.7 g/dL (ref 12.0–15.0)
MCH: 30.5 pg (ref 26.0–34.0)
MCHC: 30.8 g/dL (ref 30.0–36.0)
MCV: 98.8 fL (ref 80.0–100.0)
Platelets: 203 10*3/uL (ref 150–400)
RBC: 4.17 MIL/uL (ref 3.87–5.11)
RDW: 12.5 % (ref 11.5–15.5)
WBC: 5 10*3/uL (ref 4.0–10.5)
nRBC: 0 % (ref 0.0–0.2)

## 2023-07-22 LAB — FERRITIN: Ferritin: 57 ng/mL (ref 11–307)

## 2023-07-23 LAB — VITAMIN B12: Vitamin B-12: 411 pg/mL (ref 180–914)

## 2023-07-27 ENCOUNTER — Encounter: Payer: Self-pay | Admitting: Internal Medicine

## 2023-07-27 NOTE — Progress Notes (Signed)
Hematology/Oncology Consult note [facility]  Telephone:([phone] [phone]  Patient Care Team: [name], MD as PCP - General (Family Medicine) Lonell Face, MD as Consulting Physician (Neurology) Creig Hines, MD as Consulting Physician (Oncology)   Name of the patient: [name]  [id number]  02/01/1961   Date of visit: 07/27/23  Diagnosis- history of iron and B12 deficiency anemia secondary to gastric bypass     Chief complaint/ Reason for visit-routine follow-up anemia  Heme/Onc history: Patient is a 62 year old female with history of gastric bypass in 1998.  She has a history of iron and B12 deficiency anemia secondary to it.  She self administers B12 injections at home she has received both Feraheme and Venofer in the past.   Colonoscopy on 05/20/2016 revealed the sigmoid colon, descending colon and transverse colon were significantly redundant. There was some mild mucosal irritation at about 35-40 cm on withdrawal.  EGD on 10/12/2018 revealed a variable Z-line variable. There was LA Grade B erosive esophagitis. 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  There was bile gastritis. Roux-en-Y gastrojejunostomy with gastrojejunal anastomosis was characterized by an intact staple line and healthy appearing mucosa.    Interval history-patient is doing well overall and denies any specific complaints at this time  ECOG PS- 1 Pain scale- 0   Review of systems- Review of Systems  Constitutional:  Negative for chills, fever, malaise/fatigue and weight loss.  HENT:  Negative for congestion, ear discharge and nosebleeds.   Eyes:  Negative for blurred vision.  Respiratory:  Negative for cough, hemoptysis, sputum production, shortness of breath and wheezing.   Cardiovascular:  Negative for chest pain, palpitations, orthopnea and claudication.  Gastrointestinal:  Negative for abdominal pain, blood in stool, constipation, diarrhea,  heartburn, melena, nausea and vomiting.  Genitourinary:  Negative for dysuria, flank pain, frequency, hematuria and urgency.  Musculoskeletal:  Negative for back pain, joint pain and myalgias.  Skin:  Negative for rash.  Neurological:  Negative for dizziness, tingling, focal weakness, seizures, weakness and headaches.  Endo/Heme/Allergies:  Does not bruise/bleed easily.  Psychiatric/Behavioral:  Negative for depression and suicidal ideas. The patient does not have insomnia.       Allergies  Allergen Reactions   Prednisone Palpitations    Will take if needed     Past Medical History:  Diagnosis Date   Anemia    Anxiety    Arthritis    hands, fingers, lower back   Complication of anesthesia    loss control of bowels after block for hip replacement   Depression    Gastritis    GERD (gastroesophageal reflux disease)    Headache    hx of migraines in past   Hypertension    IDA (iron deficiency anemia) 03/17/2015   IDA (iron deficiency anemia) 03/17/2015   Neck pain    s/p fall approx 1 month ago.  Transient. "Random"     Past Surgical History:  Procedure Laterality Date   ABDOMINAL HYSTERECTOMY  2008   ANTERIOR LATERAL LUMBAR FUSION WITH PERCUTANEOUS SCREW 1 LEVEL N/A 11/22/2020   Procedure: L3-4 LATERAL INTERBODY FUSION WITH PLATING;  Surgeon: Venetia Night, MD;  Location: [facility] ORS;  Service: Neurosurgery;  Laterality: N/A;  3rd case   APPENDECTOMY     BACK SURGERY  03/14/14   L4-5 laminectomy.  Dr Dutch Quint, [facility]   CHOLECYSTECTOMY     COLONOSCOPY     COLONOSCOPY WITH PROPOFOL N/A 05/13/2016   Procedure: COLONOSCOPY WITH PROPOFOL;  Surgeon:  [name], MD;  Location: [facility] ENDOSCOPY;  Service: Endoscopy;  Laterality: N/A;   COLONOSCOPY WITH PROPOFOL N/A 05/20/2016   Procedure: COLONOSCOPY WITH PROPOFOL;  Surgeon: [name], MD;  Location: [facility] ENDOSCOPY;  Service: Endoscopy;  Laterality: N/A;   ESOPHAGOGASTRODUODENOSCOPY (EGD) WITH PROPOFOL N/A 05/13/2016    Procedure: ESOPHAGOGASTRODUODENOSCOPY (EGD) WITH PROPOFOL;  Surgeon: [name], MD;  Location: [facility] ENDOSCOPY;  Service: Endoscopy;  Laterality: N/A;   ESOPHAGOGASTRODUODENOSCOPY (EGD) WITH PROPOFOL N/A 10/12/2018   Procedure: ESOPHAGOGASTRODUODENOSCOPY (EGD) WITH PROPOFOL;  Surgeon: [name], MD;  Location: [facility] ENDOSCOPY;  Service: Endoscopy;  Laterality: N/A;   GASTRIC BYPASS  2002   JOINT REPLACEMENT Left 12/2013   hip replacement at [facility]   PLANTAR FASCIA RELEASE Left 03/07/2016   Procedure: 1. Partial plantar fascial release with endoscopic procedure   2. Topaz fasciotomy percutaneously;  Surgeon: Recardo Evangelist, DPM;  Location: [facility];  Service: Podiatry;  Laterality: Left;  LMA WITH POPLITEAL TOPAZ   TONSILLECTOMY      Social History   Socioeconomic History   Marital status: Married    Spouse name: Not on file   Number of children: 1   Years of education: 12   Highest education level: High school graduate  Occupational History   Occupation: Disabled  Tobacco Use   Smoking status: Never   Smokeless tobacco: Never  Vaping Use   Vaping status: Never Used  Substance and Sexual Activity   Alcohol use: Not Currently   Drug use: No   Sexual activity: Yes  Other Topics Concern   Not on file  Social History Narrative   Lives at home with husband.   Right-handed.   No caffeine use.   Social Determinants of Corporate investment banker Strain: Not on file  Food Insecurity: Not on file  Transportation Needs: Not on file  Physical Activity: Not on file  Stress: Not on file  Social Connections: Not on file  Intimate Partner Violence: Not on file    Family History  Problem Relation Age of Onset   Diabetes Mother    Stroke Mother    Heart attack Father        died at age 28   Heart disease Father    Macular degeneration Maternal Grandmother    Dementia Maternal Grandmother    Prostate cancer Maternal Grandfather    Heart attack Paternal  Grandmother    Heart disease Paternal Grandmother    Heart attack Paternal Grandfather    Heart disease Paternal Grandfather    Breast cancer Neg Hx      Current Outpatient Medications:    phentermine (ADIPEX-P) 37.5 MG tablet, Start 1/2 tab qam,titrate to one qd if tolerated, Disp: , Rfl:    aspirin 81 MG tablet, Take 81 mg by mouth daily., Disp: , Rfl:    Calcium Carbonate-Vitamin D 600-400 MG-UNIT tablet, Take 2 tablets by mouth 2 (two) times daily., Disp: , Rfl:    Coenzyme Q10 100 MG capsule, Take 100 mg by mouth daily., Disp: , Rfl:    cyanocobalamin (VITAMIN B12) 1000 MCG/ML injection, INJECT 1 ML (1,000 MCG TOTAL) INTO THE MUSCLE EVERY 30 DAYS., Disp: 3 mL, Rfl: 9   fexofenadine (ALLEGRA) 180 MG tablet, Take 180 mg by mouth daily as needed for allergies., Disp: , Rfl:    fluticasone (FLONASE) 50 MCG/ACT nasal spray, Place 2 sprays into both nostrils daily., Disp: 16 g, Rfl: 0   gabapentin (NEURONTIN) 600 MG tablet, Take 600  mg by mouth 2 (two) times daily., Disp: , Rfl:    hyoscyamine (LEVSIN SL) 0.125 MG SL tablet, Place under the tongue every 6 (six) hours as needed., Disp: , Rfl:    lipase/protease/amylase (CREON) 12000-38000 units CPEP capsule, Take by mouth., Disp: , Rfl:    losartan (COZAAR) 50 MG tablet, Take 50 mg by mouth daily., Disp: , Rfl:    meloxicam (MOBIC) 15 MG tablet, Take 1 tablet by mouth daily., Disp: , Rfl:    methocarbamol (ROBAXIN) 500 MG tablet, Take 1 tablet by mouth every 6 (six) hours as needed., Disp: , Rfl:    Multiple Vitamin (MULTIVITAMIN) tablet, Take 1 tablet by mouth daily., Disp: , Rfl:    Omega-3 Fatty Acids (FISH OIL) 1200 MG CAPS, Take 1,200 mg by mouth daily. , Disp: , Rfl:    pantoprazole (PROTONIX) 40 MG tablet, Take 40 mg by mouth 2 (two) times daily. , Disp: , Rfl: 2   polyethylene glycol-electrolytes (NULYTELY) 420 g solution, Use as directed for colon cleanse., Disp: , Rfl:    QSYMIA 3.75-23 MG CP24, Take 1 capsule by mouth every  morning., Disp: , Rfl:    sertraline (ZOLOFT) 100 MG tablet, Take 1 tablet (100 mg total) by mouth daily., Disp: 90 tablet, Rfl: 1   sucralfate (CARAFATE) 1 g tablet, Take 1 g by mouth 2 (two) times daily. , Disp: , Rfl:    traMADol (ULTRAM) 50 MG tablet, Take 50 mg by mouth every 6 (six) hours as needed., Disp: , Rfl:    Vitamin D, Ergocalciferol, (DRISDOL) 50000 units CAPS capsule, Take 50,000 Units by mouth 2 (two) times a week., Disp: , Rfl: 3  Physical exam:  Vitals:   07/22/23 1148  BP: 123/89  Pulse: 71  Resp: 18  Temp: (!) 97.2 F (36.2 C)  TempSrc: Tympanic  SpO2: 99%  Weight: 195 lb 1.6 oz (88.5 kg)  Height: 5\' 3"  (1.6 m)   Physical Exam Cardiovascular:     Rate and Rhythm: Normal rate and regular rhythm.     Heart sounds: Normal heart sounds.  Pulmonary:     Effort: Pulmonary effort is normal.     Breath sounds: Normal breath sounds.  Abdominal:     General: Bowel sounds are normal.     Palpations: Abdomen is soft.  Skin:    General: Skin is warm and dry.  Neurological:     Mental Status: She is alert and oriented to person, place, and time.         Latest Ref Rng & Units 07/11/2021    9:10 AM  CMP  Glucose 70 - 99 mg/dL 99   BUN 8 - 23 mg/dL 16   Creatinine 1.61 - 1.00 mg/dL 0.96   Sodium 045 - 409 mmol/L 137   Potassium 3.5 - 5.1 mmol/L 3.8   Chloride 98 - 111 mmol/L 106   CO2 22 - 32 mmol/L 27   Calcium 8.9 - 10.3 mg/dL 8.5       Latest Ref Rng & Units 07/22/2023   11:08 AM  CBC  WBC 4.0 - 10.5 K/uL 5.0   Hemoglobin 12.0 - 15.0 g/dL 81.1   Hematocrit 91.4 - 46.0 % 41.2   Platelets 150 - 400 K/uL 203     Assessment and plan- Patient is a 62 y.o. female here for routine follow-up of iron and B12 deficiency anemia likely secondary to gastric bypass  Patient last required IV iron in December 2023.  Presently she is  not anemic with an H&H of 12.7/21.2.  Ferritin levels are normal at 7 and iron studies are normal.  B12 levels are normal at 411.  I  will repeat labs in 6 months in 1 year and see her back in 1 year   Visit Diagnosis 1. Iron deficiency anemia following bariatric surgery   2. B12 deficiency      Dr. Owens Shark, MD, MPH [facility] [phone] 07/27/2023 4:43 PM

## 2023-09-22 ENCOUNTER — Encounter: Payer: Self-pay | Admitting: Internal Medicine

## 2023-12-04 ENCOUNTER — Telehealth: Admitting: Physician Assistant

## 2023-12-04 DIAGNOSIS — J019 Acute sinusitis, unspecified: Secondary | ICD-10-CM

## 2023-12-04 DIAGNOSIS — B9689 Other specified bacterial agents as the cause of diseases classified elsewhere: Secondary | ICD-10-CM | POA: Diagnosis not present

## 2023-12-04 MED ORDER — BENZONATATE 100 MG PO CAPS: 100.0000 mg | ORAL_CAPSULE | Freq: Three times a day (TID) | ORAL | 0 refills | Status: DC | PRN

## 2023-12-04 MED ORDER — DOXYCYCLINE HYCLATE 100 MG PO TABS: 100.0000 mg | ORAL_TABLET | Freq: Two times a day (BID) | ORAL | 0 refills | Status: DC

## 2023-12-04 NOTE — Progress Notes (Signed)
 Virtual Visit Consent   [name], you are scheduled for a virtual visit with a [facility] provider today. Just as with appointments in the office, your consent must be obtained to participate. Your consent will be active for this visit and any virtual visit you may have with one of our providers in the next 365 days. If you have a MyChart account, a copy of this consent can be sent to you electronically.  As this is a virtual visit, video technology does not allow for your provider to perform a traditional examination. This may limit your provider's ability to fully assess your condition. If your provider identifies any concerns that need to be evaluated in person or the need to arrange testing (such as labs, EKG, etc.), we will make arrangements to do so. Although advances in technology are sophisticated, we cannot ensure that it will always work on either your end or our end. If the connection with a video visit is poor, the visit may have to be switched to a telephone visit. With either a video or telephone visit, we are not always able to ensure that we have a secure connection.  By engaging in this virtual visit, you consent to the provision of healthcare and authorize for your insurance to be billed (if applicable) for the services provided during this visit. Depending on your insurance coverage, you may receive a charge related to this service.  I need to obtain your verbal consent now. Are you willing to proceed with your visit today? [name] has provided verbal consent on 12/04/2023 for a virtual visit (video or telephone). [name], [place]  Date: 12/04/2023 4:10 PM   Virtual Visit via Video Note   I, [name], connected with  [name]  ([id number], 12-24-1960) on 12/04/23 at  4:15 PM EDT by a video-enabled telemedicine application and verified that I am speaking with the correct person using two identifiers.  Location: Patient:  Virtual Visit Location Patient: Home Provider: Virtual Visit Location Provider: Home Office   I discussed the limitations of evaluation and management by telemedicine and the availability of in person appointments. The patient expressed understanding and agreed to proceed.    History of Present Illness: [name] is a 63 y.o. who identifies as a female who was assigned female at birth, and is being seen today for substantial sinus pressure/congestion and now with sinus pain. Notes cough that is mainly dry but persistent. Some mild aches without fever, chills. Denies chest pain or SOB.  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/2017   Gait abnormality 12/22/2017   Paresthesia 12/22/2017   Dietary iron deficiency 12/27/2016   B12 deficiency 09/26/2016   IDA (iron deficiency anemia) 03/17/2015   Degenerative spondylolisthesis 03/14/2014    Allergies:  Allergies  Allergen Reactions   Prednisone Palpitations    Will take if needed   Medications:  Current Outpatient Medications:    benzonatate (TESSALON) 100 MG capsule, Take 1 capsule (100 mg total) by mouth 3 (three) times daily as needed for cough., Disp: 30 capsule, Rfl: 0   doxycycline (VIBRA-TABS) 100 MG tablet, Take 1 tablet (100 mg total) by mouth 2 (two) times daily., Disp: 20 tablet, Rfl: 0   aspirin 81 MG tablet, Take 81 mg by mouth daily., Disp: ,  Rfl:    Calcium Carbonate-Vitamin D 600-400 MG-UNIT tablet, Take 2 tablets by mouth 2 (two) times daily., Disp: , Rfl:    Coenzyme Q10 100 MG capsule, Take 100 mg by mouth daily., Disp: , Rfl:    cyanocobalamin (VITAMIN B12) 1000 MCG/ML injection, INJECT 1 ML (1,000 MCG TOTAL) INTO THE MUSCLE EVERY 30 DAYS., Disp: 3 mL, Rfl: 9   fexofenadine (ALLEGRA) 180 MG tablet, Take 180 mg by  mouth daily as needed for allergies., Disp: , Rfl:    fluticasone (FLONASE) 50 MCG/ACT nasal spray, Place 2 sprays into both nostrils daily., Disp: 16 g, Rfl: 0   gabapentin (NEURONTIN) 600 MG tablet, Take 600 mg by mouth 2 (two) times daily., Disp: , Rfl:    hyoscyamine (LEVSIN SL) 0.125 MG SL tablet, Place under the tongue every 6 (six) hours as needed., Disp: , Rfl:    lipase/protease/amylase (CREON) 12000-38000 units CPEP capsule, Take by mouth., Disp: , Rfl:    losartan (COZAAR) 50 MG tablet, Take 50 mg by mouth daily., Disp: , Rfl:    meloxicam (MOBIC) 15 MG tablet, Take 1 tablet by mouth daily., Disp: , Rfl:    methocarbamol (ROBAXIN) 500 MG tablet, Take 1 tablet by mouth every 6 (six) hours as needed., Disp: , Rfl:    Multiple Vitamin (MULTIVITAMIN) tablet, Take 1 tablet by mouth daily., Disp: , Rfl:    Omega-3 Fatty Acids (FISH OIL) 1200 MG CAPS, Take 1,200 mg by mouth daily. , Disp: , Rfl:    pantoprazole (PROTONIX) 40 MG tablet, Take 40 mg by mouth 2 (two) times daily. , Disp: , Rfl: 2   phentermine (ADIPEX-P) 37.5 MG tablet, Start 1/2 tab qam,titrate to one qd if tolerated, Disp: , Rfl:    polyethylene glycol-electrolytes (NULYTELY) 420 g solution, Use as directed for colon cleanse., Disp: , Rfl:    QSYMIA 3.75-23 MG CP24, Take 1 capsule by mouth every morning., Disp: , Rfl:    sertraline (ZOLOFT) 100 MG tablet, Take 1 tablet (100 mg total) by mouth daily., Disp: 90 tablet, Rfl: 1   sucralfate (CARAFATE) 1 g tablet, Take 1 g by mouth 2 (two) times daily. , Disp: , Rfl:    traMADol (ULTRAM) 50 MG tablet, Take 50 mg by mouth every 6 (six) hours as needed., Disp: , Rfl:    Vitamin D, Ergocalciferol, (DRISDOL) 50000 units CAPS capsule, Take 50,000 Units by mouth 2 (two) times a week., Disp: , Rfl: 3  Observations/Objective: Patient is well-developed, well-nourished in no acute distress.  Resting comfortably at home.  Head is normocephalic, atraumatic.  No labored breathing. Speech is  clear and coherent with logical content.  Patient is alert and oriented at baseline.   Assessment and Plan: 1. Acute bacterial sinusitis (Primary) - doxycycline (VIBRA-TABS) 100 MG tablet; Take 1 tablet (100 mg total) by mouth 2 (two) times daily.  Dispense: 20 tablet; Refill: 0 - benzonatate (TESSALON) 100 MG capsule; Take 1 capsule (100 mg total) by mouth 3 (three) times daily as needed for cough.  Dispense: 30 capsule; Refill: 0  Rx Doxycycline.  Increase fluids.  Rest.  Saline nasal spray.  Probiotic.  Mucinex as directed.  Humidifier in bedroom. Tessalon per orders. Continue OTC Flonase.  Call or return to clinic if symptoms are not improving.   Follow Up Instructions: I discussed the assessment and treatment plan with the patient. The patient was provided an opportunity to ask questions and all were answered. The patient agreed with the plan and demonstrated  an understanding of the instructions.  A copy of instructions were sent to the patient via MyChart unless otherwise noted below.   The patient was advised to call back or seek an in-person evaluation if the symptoms worsen or if the condition fails to improve as anticipated.    Amber Climes, PA-C

## 2023-12-04 NOTE — Patient Instructions (Signed)
 [name], thank you for joining Piedad Climes, PA-C for today's virtual visit.  While this provider is not your primary care provider (PCP), if your PCP is located in our provider database this encounter information will be shared with them immediately following your visit.   A [facility] MyChart account gives you access to today's visit and all your visits, tests, and labs performed at [facility] " click here if you don't have a  MyChart account or go to [email or web address]  Consent: (Patient) [name] provided verbal consent for this virtual visit at the beginning of the encounter.  Current Medications:  Current Outpatient Medications:    aspirin 81 MG tablet, Take 81 mg by mouth daily., Disp: , Rfl:    Calcium Carbonate-Vitamin D 600-400 MG-UNIT tablet, Take 2 tablets by mouth 2 (two) times daily., Disp: , Rfl:    Coenzyme Q10 100 MG capsule, Take 100 mg by mouth daily., Disp: , Rfl:    cyanocobalamin (VITAMIN B12) 1000 MCG/ML injection, INJECT 1 ML (1,000 MCG TOTAL) INTO THE MUSCLE EVERY 30 DAYS., Disp: 3 mL, Rfl: 9   fexofenadine (ALLEGRA) 180 MG tablet, Take 180 mg by mouth daily as needed for allergies., Disp: , Rfl:    fluticasone (FLONASE) 50 MCG/ACT nasal spray, Place 2 sprays into both nostrils daily., Disp: 16 g, Rfl: 0   gabapentin (NEURONTIN) 600 MG tablet, Take 600 mg by mouth 2 (two) times daily., Disp: , Rfl:    hyoscyamine (LEVSIN SL) 0.125 MG SL tablet, Place under the tongue every 6 (six) hours as needed., Disp: , Rfl:    lipase/protease/amylase (CREON) 12000-38000 units CPEP capsule, Take by mouth., Disp: , Rfl:    losartan (COZAAR) 50 MG tablet, Take 50 mg by mouth daily., Disp: , Rfl:    meloxicam (MOBIC) 15 MG tablet, Take 1 tablet by mouth daily., Disp: , Rfl:    methocarbamol (ROBAXIN) 500 MG tablet, Take 1 tablet by mouth every 6 (six) hours as needed., Disp: , Rfl:    Multiple Vitamin (MULTIVITAMIN)  tablet, Take 1 tablet by mouth daily., Disp: , Rfl:    Omega-3 Fatty Acids (FISH OIL) 1200 MG CAPS, Take 1,200 mg by mouth daily. , Disp: , Rfl:    pantoprazole (PROTONIX) 40 MG tablet, Take 40 mg by mouth 2 (two) times daily. , Disp: , Rfl: 2   phentermine (ADIPEX-P) 37.5 MG tablet, Start 1/2 tab qam,titrate to one qd if tolerated, Disp: , Rfl:    polyethylene glycol-electrolytes (NULYTELY) 420 g solution, Use as directed for colon cleanse., Disp: , Rfl:    QSYMIA 3.75-23 MG CP24, Take 1 capsule by mouth every morning., Disp: , Rfl:    sertraline (ZOLOFT) 100 MG tablet, Take 1 tablet (100 mg total) by mouth daily., Disp: 90 tablet, Rfl: 1   sucralfate (CARAFATE) 1 g tablet, Take 1 g by mouth 2 (two) times daily. , Disp: , Rfl:    traMADol (ULTRAM) 50 MG tablet, Take 50 mg by mouth every 6 (six) hours as needed., Disp: , Rfl:    Vitamin D, Ergocalciferol, (DRISDOL) 50000 units CAPS capsule, Take 50,000 Units by mouth 2 (two) times a week., Disp: , Rfl: 3   Medications ordered in this encounter:  No orders of the defined types were placed in this encounter.    *If you need refills on other medications prior to your next appointment, please contact your pharmacy*  Follow-Up: Call back or seek an in-person evaluation if the symptoms  worsen or if the condition fails to improve as anticipated.  [facility] [phone]  Other Instructions Please take antibiotic as directed.  Increase fluid intake.  Use Saline nasal spray.  Take a daily multivitamin. Continue your Fluticasone nasal spray.  Place a humidifier in the bedroom.  Please call or return clinic if symptoms are not improving.  Sinusitis Sinusitis is redness, soreness, and swelling (inflammation) of the paranasal sinuses. Paranasal sinuses are air pockets within the bones of your face (beneath the eyes, the middle of the forehead, or above the eyes). In healthy paranasal sinuses, mucus is able to drain out, and air is able  to circulate through them by way of your nose. However, when your paranasal sinuses are inflamed, mucus and air can become trapped. This can allow bacteria and other germs to grow and cause infection. Sinusitis can develop quickly and last only a short time (acute) or continue over a long period (chronic). Sinusitis that lasts for more than 12 weeks is considered chronic.  CAUSES  Causes of sinusitis include: Allergies. Structural abnormalities, such as displacement of the cartilage that separates your nostrils (deviated septum), which can decrease the air flow through your nose and sinuses and affect sinus drainage. Functional abnormalities, such as when the small hairs (cilia) that line your sinuses and help remove mucus do not work properly or are not present. SYMPTOMS  Symptoms of acute and chronic sinusitis are the same. The primary symptoms are pain and pressure around the affected sinuses. Other symptoms include: Upper toothache. Earache. Headache. Bad breath. Decreased sense of smell and taste. A cough, which worsens when you are lying flat. Fatigue. Fever. Thick drainage from your nose, which often is green and may contain pus (purulent). Swelling and warmth over the affected sinuses. DIAGNOSIS  Your caregiver will perform a physical exam. During the exam, your caregiver may: Look in your nose for signs of abnormal growths in your nostrils (nasal polyps). Tap over the affected sinus to check for signs of infection. View the inside of your sinuses (endoscopy) with a special imaging device with a light attached (endoscope), which is inserted into your sinuses. If your caregiver suspects that you have chronic sinusitis, one or more of the following tests may be recommended: Allergy tests. Nasal culture A sample of mucus is taken from your nose and sent to a lab and screened for bacteria. Nasal cytology A sample of mucus is taken from your nose and examined by your caregiver to  determine if your sinusitis is related to an allergy. TREATMENT  Most cases of acute sinusitis are related to a viral infection and will resolve on their own within 10 days. Sometimes medicines are prescribed to help relieve symptoms (pain medicine, decongestants, nasal steroid sprays, or saline sprays).  However, for sinusitis related to a bacterial infection, your caregiver will prescribe antibiotic medicines. These are medicines that will help kill the bacteria causing the infection.  Rarely, sinusitis is caused by a fungal infection. In theses cases, your caregiver will prescribe antifungal medicine. For some cases of chronic sinusitis, surgery is needed. Generally, these are cases in which sinusitis recurs more than 3 times per year, despite other treatments. HOME CARE INSTRUCTIONS  Drink plenty of water. Water helps thin the mucus so your sinuses can drain more easily. Use a humidifier. Inhale steam 3 to 4 times a day (for example, sit in the bathroom with the shower running). Apply a warm, moist washcloth to your face 3 to 4  times a day, or as directed by your caregiver. Use saline nasal sprays to help moisten and clean your sinuses. Take over-the-counter or prescription medicines for pain, discomfort, or fever only as directed by your caregiver. SEEK IMMEDIATE MEDICAL CARE IF: You have increasing pain or severe headaches. You have nausea, vomiting, or drowsiness. You have swelling around your face. You have vision problems. You have a stiff neck. You have difficulty breathing. MAKE SURE YOU:  Understand these instructions. Will watch your condition. Will get help right away if you are not doing well or get worse. Document Released: 09/09/2005 Document Revised: 12/02/2011 Document Reviewed: 09/24/2011 [facility] Patient Information 2014 [place], [place].    If you have been instructed to have an in-person evaluation today at a local Urgent Care facility, please use the link below. It  will take you to a list of all of our available [facility], including address, phone number and hours of operation. Please do not delay care.  [facility]  If you or a family member do not have a primary care provider, use the link below to schedule a visit and establish care. When you choose a [facility] primary care physician or advanced practice provider, you gain a long-term partner in health. Find a Primary Care Provider  Learn more about [facility]'s in-office and virtual care options: [facility] - Get Care Now

## 2024-01-20 ENCOUNTER — Inpatient Hospital Stay: Payer: Medicare Other | Attending: Oncology

## 2024-01-20 DIAGNOSIS — Z9049 Acquired absence of other specified parts of digestive tract: Secondary | ICD-10-CM | POA: Diagnosis not present

## 2024-01-20 DIAGNOSIS — E538 Deficiency of other specified B group vitamins: Secondary | ICD-10-CM

## 2024-01-20 DIAGNOSIS — D509 Iron deficiency anemia, unspecified: Secondary | ICD-10-CM | POA: Diagnosis present

## 2024-01-20 DIAGNOSIS — Z9884 Bariatric surgery status: Secondary | ICD-10-CM | POA: Diagnosis not present

## 2024-01-20 DIAGNOSIS — D519 Vitamin B12 deficiency anemia, unspecified: Secondary | ICD-10-CM | POA: Insufficient documentation

## 2024-01-20 DIAGNOSIS — D508 Other iron deficiency anemias: Secondary | ICD-10-CM

## 2024-01-20 LAB — COMPREHENSIVE METABOLIC PANEL WITH GFR
ALT: 24 U/L (ref 0–44)
AST: 23 U/L (ref 15–41)
Albumin: 3.6 g/dL (ref 3.5–5.0)
Alkaline Phosphatase: 84 U/L (ref 38–126)
Anion gap: 6 (ref 5–15)
BUN: 21 mg/dL (ref 8–23)
CO2: 26 mmol/L (ref 22–32)
Calcium: 8.4 mg/dL — ABNORMAL LOW (ref 8.9–10.3)
Chloride: 104 mmol/L (ref 98–111)
Creatinine, Ser: 0.69 mg/dL (ref 0.44–1.00)
GFR, Estimated: >60 mL/min (ref >60)
Glucose, Bld: 104 mg/dL — ABNORMAL HIGH (ref 70–99)
Potassium: 4.5 mmol/L (ref 3.5–5.1)
Sodium: 136 mmol/L (ref 135–145)
Total Bilirubin: 0.4 mg/dL (ref 0.0–1.2)
Total Protein: 6.5 g/dL (ref 6.5–8.1)

## 2024-01-20 LAB — CBC
HCT: 40.3 % (ref 36.0–46.0)
Hemoglobin: 12.4 g/dL (ref 12.0–15.0)
MCH: 30.3 pg (ref 26.0–34.0)
MCHC: 30.8 g/dL (ref 30.0–36.0)
MCV: 98.5 fL (ref 80.0–100.0)
Platelets: 221 10*3/uL (ref 150–400)
RBC: 4.09 MIL/uL (ref 3.87–5.11)
RDW: 13.2 % (ref 11.5–15.5)
WBC: 5.4 10*3/uL (ref 4.0–10.5)
nRBC: 0 % (ref 0.0–0.2)

## 2024-01-20 LAB — IRON AND TIBC
Iron: 95 ug/dL (ref 28–170)
Saturation Ratios: 29 % (ref 10.4–31.8)
TIBC: 332 ug/dL (ref 250–450)
UIBC: 237 ug/dL

## 2024-01-20 LAB — VITAMIN B12: Vitamin B-12: 1225 pg/mL — ABNORMAL HIGH (ref 180–914)

## 2024-01-20 LAB — FERRITIN: Ferritin: 35 ng/mL (ref 11–307)

## 2024-01-21 ENCOUNTER — Telehealth: Payer: Self-pay

## 2024-01-21 NOTE — Telephone Encounter (Signed)
 Per Dr. Randy Buttery "Ferritin is low but she is not anemic. Does she want iv iron ?".  Outbound call to patient;  Informed of above.  Patient says she's tired and would like to move forward with IV iron .  Patient noticed calcium was very low and she has already reached out to endocrinologist regarding this. Advised that our scheduling team will be reaching out shortly to coordinate her appointment; patient verbalized understanding.

## 2024-01-22 ENCOUNTER — Other Ambulatory Visit: Payer: Self-pay | Admitting: Oncology

## 2024-01-22 ENCOUNTER — Encounter: Payer: Self-pay | Admitting: Internal Medicine

## 2024-01-27 ENCOUNTER — Telehealth: Payer: Self-pay | Admitting: *Deleted

## 2024-01-27 NOTE — Telephone Encounter (Signed)
 She is getting iv iron  tomorrow. If she still feels sob despite receiving iv iron  she will need to speak to her pcp

## 2024-01-27 NOTE — Telephone Encounter (Signed)
 Per Dr. Randy Buttery "She is getting iv iron  tomorrow. If she still feels sob despite receiving iv iron  she will need to speak to her pcp".  Outbound call to patient; informed of above.  Patient mentioned she was tired, informed IV iron  should help with but to follow up with PCP if SOB persists. Patient verbalized understanding.

## 2024-01-27 NOTE — Telephone Encounter (Signed)
 The patient just wants to talk about she is feeling short of breath and she thinks that its mostly because of the ferritin being low because each time that she gets to feeling short of breath it seems like the ferritin is low also and she says she is just really tired in energy and then she said fatigue.  I am sure that she could get this answered tomorrow  if we don't get it to you before the end of day.

## 2024-01-28 ENCOUNTER — Inpatient Hospital Stay: Attending: Oncology

## 2024-01-28 VITALS — BP 124/93 | HR 75 | Temp 97.7°F | Resp 18

## 2024-01-28 DIAGNOSIS — Z9049 Acquired absence of other specified parts of digestive tract: Secondary | ICD-10-CM | POA: Insufficient documentation

## 2024-01-28 DIAGNOSIS — Z9884 Bariatric surgery status: Secondary | ICD-10-CM | POA: Insufficient documentation

## 2024-01-28 DIAGNOSIS — D519 Vitamin B12 deficiency anemia, unspecified: Secondary | ICD-10-CM | POA: Diagnosis not present

## 2024-01-28 DIAGNOSIS — D509 Iron deficiency anemia, unspecified: Secondary | ICD-10-CM | POA: Diagnosis present

## 2024-01-28 DIAGNOSIS — D508 Other iron deficiency anemias: Secondary | ICD-10-CM

## 2024-01-28 MED ORDER — SODIUM CHLORIDE 0.9 % IV SOLN
1000.0000 mg | Freq: Once | INTRAVENOUS | Status: AC
  Administered 2024-01-28: 1000 mg via INTRAVENOUS
  Filled 2024-01-28: qty 1000

## 2024-01-28 MED ORDER — SODIUM CHLORIDE 0.9 % IV SOLN
INTRAVENOUS | Status: DC
  Filled 2024-01-28: qty 250

## 2024-01-28 NOTE — Patient Instructions (Signed)

## 2024-01-30 ENCOUNTER — Other Ambulatory Visit: Payer: Self-pay | Admitting: Family Medicine

## 2024-01-30 DIAGNOSIS — Z1231 Encounter for screening mammogram for malignant neoplasm of breast: Secondary | ICD-10-CM

## 2024-02-27 ENCOUNTER — Other Ambulatory Visit: Payer: Self-pay | Admitting: Cardiology

## 2024-02-27 DIAGNOSIS — R079 Chest pain, unspecified: Secondary | ICD-10-CM

## 2024-02-27 DIAGNOSIS — Z8249 Family history of ischemic heart disease and other diseases of the circulatory system: Secondary | ICD-10-CM

## 2024-02-27 DIAGNOSIS — R0789 Other chest pain: Secondary | ICD-10-CM

## 2024-02-27 DIAGNOSIS — I209 Angina pectoris, unspecified: Secondary | ICD-10-CM

## 2024-02-27 DIAGNOSIS — R0609 Other forms of dyspnea: Secondary | ICD-10-CM

## 2024-02-27 DIAGNOSIS — I10 Essential (primary) hypertension: Secondary | ICD-10-CM

## 2024-03-08 ENCOUNTER — Ambulatory Visit

## 2024-03-11 ENCOUNTER — Other Ambulatory Visit: Payer: Self-pay | Admitting: Oncology

## 2024-03-11 DIAGNOSIS — E538 Deficiency of other specified B group vitamins: Secondary | ICD-10-CM

## 2024-03-22 ENCOUNTER — Ambulatory Visit
Admission: RE | Admit: 2024-03-22 | Discharge: 2024-03-22 | Disposition: A | Discharge status: Home / Self Care | Source: Ambulatory Visit | Attending: Family Medicine | Admitting: Family Medicine

## 2024-03-22 ENCOUNTER — Encounter: Payer: Self-pay | Admitting: Internal Medicine

## 2024-03-22 DIAGNOSIS — Z1231 Encounter for screening mammogram for malignant neoplasm of breast: Secondary | ICD-10-CM | POA: Diagnosis present

## 2024-03-23 ENCOUNTER — Encounter (HOSPITAL_COMMUNITY): Payer: Self-pay

## 2024-03-24 ENCOUNTER — Telehealth (HOSPITAL_COMMUNITY): Payer: Self-pay | Admitting: Emergency Medicine

## 2024-03-24 MED ORDER — METOPROLOL TARTRATE 50 MG PO TABS: 50.0000 mg | ORAL_TABLET | Freq: Once | ORAL | 0 refills | Status: DC

## 2024-03-24 NOTE — Telephone Encounter (Signed)
 Reaching out to patient to offer assistance regarding upcoming cardiac imaging study; pt verbalizes understanding of appt date/time, parking situation and where to check in, pre-test NPO status and medications ordered, and verified current allergies; name and call back number provided for further questions should they arise Rockwell Alexandria RN Navigator Cardiac Imaging [facility] [phone] office [phone] cell

## 2024-03-25 ENCOUNTER — Ambulatory Visit
Admission: RE | Admit: 2024-03-25 | Discharge: 2024-03-25 | Disposition: A | Discharge status: Home / Self Care | Source: Ambulatory Visit | Attending: Cardiology | Admitting: Cardiology

## 2024-03-25 DIAGNOSIS — Z8249 Family history of ischemic heart disease and other diseases of the circulatory system: Secondary | ICD-10-CM | POA: Insufficient documentation

## 2024-03-25 DIAGNOSIS — R079 Chest pain, unspecified: Secondary | ICD-10-CM | POA: Diagnosis present

## 2024-03-25 DIAGNOSIS — I10 Essential (primary) hypertension: Secondary | ICD-10-CM | POA: Insufficient documentation

## 2024-03-25 DIAGNOSIS — I209 Angina pectoris, unspecified: Secondary | ICD-10-CM | POA: Diagnosis present

## 2024-03-25 DIAGNOSIS — R0789 Other chest pain: Secondary | ICD-10-CM | POA: Diagnosis present

## 2024-03-25 DIAGNOSIS — R0609 Other forms of dyspnea: Secondary | ICD-10-CM | POA: Diagnosis present

## 2024-03-25 MED ORDER — IOHEXOL 350 MG/ML SOLN
80.0000 mL | Freq: Once | INTRAVENOUS | Status: AC | PRN
  Administered 2024-03-25: 80 mL via INTRAVENOUS

## 2024-03-25 MED ORDER — NITROGLYCERIN 0.4 MG SL SUBL
0.8000 mg | SUBLINGUAL_TABLET | Freq: Once | SUBLINGUAL | Status: AC
  Administered 2024-03-25: 0.8 mg via SUBLINGUAL
  Filled 2024-03-25: qty 25

## 2024-03-25 NOTE — Progress Notes (Signed)
 Patient tolerated CT well. Vitals stable. Instructed patient to drink water throughout the day. Patient verbalized understanding.

## 2024-04-02 NOTE — Progress Notes (Signed)
 Established patient Visit    Chief Complaint: Coronary artery disease Date of Service: 04/02/2024 Date of Birth: 06/03/1961 PCP: Valora Lynwood FALCON, MD  History of Present Illness: Ms. [name] is a 63 y.o.female patient who presented to our clinic for evaluation for follow-up of coronary artery disease  Past medical history significant coronary artery disease as noted by mild stenosis in LAD, left circumflex, RCA with CAC score of 997 on coronary CTA 03/2024, hypertension, obesity, family history of premature CAD with father dying of MI at age of 81.  Stress echocardiogram 2022 with no ischemia.  Echocardiogram 02/2024 with normal biventricular systolic function, no significant valvular abnormality  Today patient detailed that she is overall doing well.  No complaints of chest pain.  Has stable exertional dyspnea.  No palpitation, dizziness or syncope.  Past Medical and Surgical History  Past Medical History Past Medical History:  Diagnosis Date  . Anemia   . Angina pectoris ()   . Arthritis   . Depression   . Gastritis 05/13/2016  . GERD (gastroesophageal reflux disease)   . Hypertension   . Lumbago   . Migraine   . Migraines   . Neuropathy   . Obesity   . Osteoporosis   . Ovarian cyst   . Redundant colon 05/20/2016  . Reflux esophagitis 05/13/2016    Past Surgical History She has a past surgical history that includes Hysterectomy Total Abdominal W/Removal Tubes &/Or Ovaries (07/2010); Gastric bypass surgery; LEFT TOTAL HIP ARTHROPLASTY (Left, 01/17/2014); L4-5 laminectomy, bilateral L4 & L5 foraminotomies, L4-5 interbody arthrodesis, L4-5posterolateral arthrodesis utilizing nonsegmental pedical screw instrumentation (03/14/2014); Colonoscopy (01/14/2011); endoscopic partial plantar fascial release left heel (Left, 02/2016); Colonoscopy (05/13/2016); egd (05/13/2016); Colonoscopy (05/20/2016); Joint replacement; Spine surgery; egd (10/12/2018); L3-4 LATERAL INTERBODY FUSION WITH  PLATING (11/22/2020); back surgery (March 5th 2022); Colonoscopy (06/22/2021); and egd (03/10/2023).   Medications and Allergies  Current Medications Current Outpatient Medications  Medication Sig Dispense Refill  . ashwagandha root extract 300 mg Tab Take by mouth    . aspirin 81 MG EC tablet Take 81 mg by mouth once daily    . calcium carbonate-vitamin D3 (CALTRATE 600+D) 600 mg(1,500mg ) -200 unit tablet Take 1 tablet by mouth 2 (two) times daily with meals    . CREON 12,000-38,000 -60,000 unit DR capsule Take 3 capsules by mouth 3 (three) times daily with meals . Take 1 capsule with snacks (Patient taking differently: Take 1 capsule by mouth every other day . Take 1 capsule with snacks) 320 capsule 0  . cyanocobalamin  (VITAMIN B12) 1,000 mcg/mL injection Inject 1,000 mcg into the muscle monthly    . dexlansoprazole (DEXILANT) 60 mg DR capsule Take 1 capsule (60 mg total) by mouth once daily 30 capsule 11  . ergocalciferol , vitamin D2, 1,250 mcg (50,000 unit) capsule TAKE 1 CAPSULE (50,000 UNITS TOTAL) BY MOUTH TWICE A WEEK 24 capsule 3  . famotidine (PEPCID) 40 MG tablet Take 1 tablet (40 mg total) by mouth at bedtime 30 tablet 11  . fexofenadine (ALLEGRA) 180 MG tablet Take 180 mg by mouth once daily       . gabapentin  (NEURONTIN ) 300 MG capsule Take gabapentin  600 mg in AM and 300 mg or 600 mg in PM. 30 capsule 2  . gabapentin  (NEURONTIN ) 600 MG tablet TAKE GABAPENTIN  600 MG IN AM AND 300 MG OR 600 MG IN PM. 60 tablet 0  . losartan  (COZAAR ) 50 MG tablet TAKE 1 TABLET BY MOUTH EVERY DAY 90 tablet 3  . meloxicam (  MOBIC) 15 MG tablet TAKE 1 TABLET BY MOUTH EVERY DAY 90 tablet 3  . multivitamin tablet Take 1 tablet by mouth once daily    . omega-3 fatty acids-fish oil 360-1,200 mg Cap Take 1 capsule by mouth once daily    . phentermine (ADIPEX-P) 37.5 mg tablet START WITH 1/2 TABLET ORALLY EVERY MORNING MOVE UP TO 1 TABLET AS TOLERATED 30 tablet 2  . sertraline  (ZOLOFT ) 100 MG tablet TAKE 2  TABLETS BY MOUTH AT BEDTIME 180 tablet 3  . sucralfate  (CARAFATE ) 1 gram tablet Take 1 tablet (1 g total) by mouth 4 (four) times daily 360 tablet 0  . atorvastatin (LIPITOR) 20 MG tablet Take 1 tablet (20 mg total) by mouth once daily 30 tablet 11  . metoprolol  TARTrate (LOPRESSOR ) 50 MG tablet Take 1 tablet (50 mg total) by mouth once for 1 dose (Patient not taking: Reported on 04/02/2024) 1 tablet 0   No current facility-administered medications for this visit.    Allergies Prednisone   Social and Family History  Social History  reports that she has quit smoking. She has never used smokeless tobacco. She reports that she does not currently use alcohol. She reports that she does not use drugs.  Family History family history includes Allergies in her mother; Aortic aneurysm in her maternal aunt; Asthma in her mother; Colon cancer in her maternal grandfather; Coronary Artery Disease (Blocked arteries around heart) in her father and maternal grandfather; Diabetes type II in her maternal grandmother and mother; High blood pressure (Hypertension) in her father, maternal grandmother, mother, and sister; Mental illness in her mother; Myocardial Infarction (Heart attack) (age of onset: 38) in her father; Osteoporosis (Thinning of bones) in her maternal grandmother and mother; Prostate cancer in her maternal grandfather; Rheum arthritis in her mother.   Review of Systems   Review of Systems:  Atypical chest discomfort, worsening shortness of breath  Physical Examination   Vitals:BP 118/72 (BP Location: Right upper arm, Patient Position: Sitting, BP Cuff Size: Large Adult)   Pulse 71   Ht 160 cm (5' 3)   Wt 89.7 kg (197 lb 12.8 oz)   LMP  (LMP Unknown) Comment: Hysterectomy  SpO2 95%   BMI 35.04 kg/m  Ht:160 cm (5' 3) Wt:89.7 kg (197 lb 12.8 oz) ADJ:Anib surface area is 2 meters squared. Body mass index is 35.04 kg/m.  HEENT: Pupils equally reactive to light and accomodation  Neck:  Supple, no significant JVD Lungs: clear to auscultation bilaterally; no wheezes, rales, rhonchi Heart: Regular rate and rhythm.  Grade 3/6 systolic murmur in aortic area Extremities: no pedal edema  Assessment and Plan   63 y.o. female with  Coronary artery disease, mild nonobstructive multivessel CAD High CAC score 997 Hypertension Obesity Family history of premature CAD  Stable from cardiac standpoint without anginal symptoms. Discussed aggressive risk factor modification in great detail. Continue aspirin 81 mg daily Will start Lipitor 20 mg daily. Blood pressure well-controlled, continue losartan  and metoprolol  Heart healthy diet and regular activity, work on weight loss  No orders of the defined types were placed in this encounter.   Return in about 6 months (around 10/03/2024).  KRISHNA CHAITANYA ALLURI, MD  This dictation was prepared with dragon dictation. Any transcription errors that result from this process are unintentional.

## 2024-06-21 NOTE — Progress Notes (Signed)
 Today the history is gathered from: 100% - patient  0% - patient's alone  RECORDS SUMMARY: I have reviewed the note dated 12/27/20 from Dr. valora who has indicated:  Patient with numbness and tingling  Given these abnormal neurologic findings, a referral to neurology has been recommended.  REFERRING PHYSICIAN: Jonette Lauraine Norris,* PRIMARY CARE PHYSICIAN:  Valora Lynwood FALCON, MD   IMPRESSION/PLAN  [name] is a 63 y.o. female presenting for evaluation of  NEUROPATHY/ NUMBNESS/ TINGLING/ BACK PAIN/ DIFFICULTY SLEEPING Ongoing Patient presents for follow-up of neuropathy.  Her primary concerns today are ongoing imbalance and bilateral leg weakness.  Patient also notes potential concern of brain fog since starting gabapentin .  She is currently taking gabapentin  600 mg in the morning, 600 mg at night, and often additional 600 mg overnight.  Patient uses both tablets and capsules. Discussed at length pathophysiology of neuropathy and secondary effects including imbalance and weakness. Continue gabapentin  600 mg in the morning, 600 mg in the evening, and additional 600 mg at night as needed Could consider tapering this due to concern of brain fog, will hold off at this time. Offered referral to gait and balance PT, patient defers Continue duloxetine 60 mg nightly per psychiatry This may also be helpful as supplemental treatment for neuropathy symptoms. Recommend daily balance exercises, handout provided  Encouraged patient to stay physically active and exercise on regular basis (90 mins per week or 15 mins per day). Exercise can be very beneficial in many neurological conditions.  Continue present management otherwise   Follow-up in 3-4 months, sooner if needed.   Medications previously tried: Gabapentin -drowsiness, dizziness, imbalance  CHIEF COMPLAINT & HPI  [name] is a 63 y.o. female presenting for evaluation of: Chief Complaint  Patient presents with  . NEUROPATHY/  NUMBNESS/ TINGLING/ BACK PAIN  . Sleeping Problem    NEUROPATHY/ NUMBNESS/ TINGLING/ BACK PAIN/ DIFFICULTY SLEEPING Patient has had multiple falls in the setting of imbalance and lower extremity weakness. Describes her weakness as similar to when she is hypoglycemic. Denies head trauma, LOC. Neuropathy in lower extremities radiating from feet towards bilateral knees, worse in right. Limited physical activity. Not using assistive device. Endorses poor sleep at night. Currently seeing psychiatrist for sleep and depression. Taking Gabapentin  600 mg in the morning and 300 mg or 600 mg in the evening, Cymbalta 60 mg daily, Zoloft  200 mg nightly.    DATA SUMMARY: 03/05/21 EMG lowers Impression: Abnormal study.  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   04/17/2020 CT LUMBAR SPINE WO IMPRESSION:  Mild subarticular stenosis bilaterally L3-4 unchanged  Moderate spinal stenosis and moderate subarticular stenosis  bilaterally L3-4. Scoliosis and asymmetric disc degeneration on the  left.  Solid fusion L4-5 without stenosis  Right foraminal disc protrusion L5-S1 with right L5 nerve root  impingement.   04/11/2020 MRI LUMBAR SPINE WO IMPRESSION:  1. Multilevel degenerative changes of the lumbar spine as described  above, overall similar to prior study.  2. Findings remain worst at L3-L4 where there is unchanged moderate  spinal canal, severe left, and moderate to severe right  neuroforaminal stenosis.  3. Unchanged moderate to severe right neuroforaminal stenosis at  L5-S1.     01/30/2018 EMG/ NCV Conclusion:  This is a normal study.  There is no electrodiagnostic evidence  of large fiber peripheral neuropathy or bilateral lumbosacral  radiculopathy.   NCS/EMG 08/12/2017: 1) Left moderate (grade III) carpal tunnel syndrome (median nerve entrapment at wrist) affecting sensory  and motor components.  2) Right mild (grade II) carpal  tunnel syndrome (median nerve entrapment at wrist) affecting sensory component.    VISIT SUMMARIES: 01/21/22: Patient reports ongoing neuropathy in bilateral legs and hands. Will schedule an EMG uppers to evaluate for bilateral hand numbness. Continue gabapentin  600 mg tablet 2-3 times daily and 300 mg capsules twice nightly as needed.  05/07/21: Ongoing. Schedule for EMG uppers.Increase gabapentin  to 600mg  in the morning and 1500mg  at night.Continue balance exercises at home.Follow up after nerve study or in 2-3 months.   02/02/21: New to me. Patient with numbness and tingling.  EMG uppers and lowers.Please see cardiology before restarting gabapentin . Let me know if you would like to adjust your dose or try a different medication such as Lyrica.   MEDICATIONS Current Outpatient Medications  Medication Sig Dispense Refill  . ashwagandha root extract 300 mg Tab Take by mouth    . aspirin 81 MG EC tablet Take 81 mg by mouth once daily    . atorvastatin (LIPITOR) 20 MG tablet Take 1 tablet (20 mg total) by mouth once daily (Patient taking differently: Take 20 mg by mouth Taking some days) 30 tablet 11  . calcium carbonate-vitamin D3 (CALTRATE 600+D) 600 mg(1,500mg ) -200 unit tablet Take 1 tablet by mouth 2 (two) times daily with meals    . CREON 12,000-38,000 -60,000 unit DR capsule Take 3 capsules by mouth 3 (three) times daily with meals . Take 1 capsule with snacks (Patient taking differently: Take 1 capsule by mouth every other day . Take 1 capsule with snacks) 320 capsule 0  . cyanocobalamin  (VITAMIN B12) 1,000 mcg/mL injection Inject 1,000 mcg into the muscle monthly    . DULoxetine (CYMBALTA) 60 MG DR capsule Take 60 mg by mouth once daily    . ergocalciferol , vitamin D2, 1,250 mcg (50,000 unit) capsule TAKE 1 CAPSULE (50,000 UNITS TOTAL) BY MOUTH TWICE A WEEK 24 capsule 3  . fexofenadine (ALLEGRA) 180 MG tablet Take 180 mg by mouth once daily       . gabapentin  (NEURONTIN ) 300 MG capsule Take  gabapentin  600 mg in AM and 300 mg or 600 mg in PM. 30 capsule 5  . gabapentin  (NEURONTIN ) 600 MG tablet Take gabapentin  600 mg in AM and 300 mg or 600 mg in PM. 60 tablet 5  . losartan  (COZAAR ) 50 MG tablet TAKE 1 TABLET BY MOUTH EVERY DAY 90 tablet 3  . meloxicam (MOBIC) 15 MG tablet TAKE 1 TABLET BY MOUTH EVERY DAY 90 tablet 3  . metoprolol  TARTrate (LOPRESSOR ) 50 MG tablet Take 1 tablet (50 mg total) by mouth once for 1 dose 1 tablet 0  . multivitamin tablet Take 1 tablet by mouth once daily    . omega-3 fatty acids-fish oil 360-1,200 mg Cap Take 1 capsule by mouth once daily    . pantoprazole  (PROTONIX ) 40 MG DR tablet Take 1 tablet (40 mg total) by mouth 2 (two) times daily Take 30 min before meals 60 tablet 3  . sertraline  (ZOLOFT ) 100 MG tablet TAKE 2 TABLETS BY MOUTH AT BEDTIME 180 tablet 3  . sucralfate  (CARAFATE ) 1 gram tablet Take 1 tablet (1 g total) by mouth 4 (four) times daily (Patient taking differently: Take 1 g by mouth 2 (two) times daily before meals) 360 tablet 0  . famotidine (PEPCID) 40 MG tablet Take 1 tablet (40 mg total) by mouth at bedtime (Patient not taking: Reported on 06/21/2024) 30 tablet 11  . phentermine (ADIPEX-P) 37.5 mg  tablet START WITH 1/2 TABLET ORALLY EVERY MORNING MOVE UP TO 1 TABLET AS TOLERATED (Patient not taking: Reported on 06/21/2024) 30 tablet 2   No current facility-administered medications for this visit.    ALLERGIES Allergies  Allergen Reactions  . Prednisone  Palpitations    Will take if needed    EXAM   Vitals:   06/21/24 1259  BP: 118/80  Weight: 91.2 kg (201 lb)  Height: 160 cm (5' 3)  PainSc: 0-No pain     Body mass index is 35.61 kg/m.  GENERAL: Pleasant female. No acute distress. Normocephalic and atraumatic.  Baseline neurological exam below was obtained at prior office visit. Changes from today's visit appear in bold.   MUSCULOSKELETAL: Bulk - Normal Tone - Normal Pronator Drift - Not assessed Ambulation - Gait  and station is steady Romberg - Not assessed  Flexion, extension and  grip strength in bilateral hands 5/5. Sensation intact in hands bilaterally. Negative Tinel's sign bilaterally. Negative Finkelstein's test bilaterally.    NEUROLOGICAL: MENTAL STATUS: Patient is oriented to person, place and time.   Short-term memory is intact. Long-term memory is intact.   Attention span and concentration are intact.   Naming and repetition are intact. Comprehension is intact.   Expressive speech is intact.   Patient's fund of knowledge is within normal limits for educational level.   PAST MEDICAL HISTORY Past Medical History:  Diagnosis Date  . Acquired spondylolisthesis   . Anemia   . Angina pectoris ()   . Arthritis   . Chronic left-sided low back pain with left-sided sciatica   . Cystocele, midline   . Depression   . Depression with anxiety   . Gastritis 05/13/2016  . GERD (gastroesophageal reflux disease)   . Hypertension   . Lumbago   . Migraine   . Migraines   . Mixed sensory-motor polyneuropathy   . Neuropathy   . OAB (overactive bladder)   . Obesity   . Osteoporosis   . Ovarian cyst   . Redundant colon 05/20/2016  . Reflux esophagitis 05/13/2016  . S/P gastric bypass     PAST SURGICAL HISTORY Past Surgical History:  Procedure Laterality Date  . HYSTERECTOMY TOTAL ABDOMINAL W/REMOVAL TUBES &/OR OVARIES  07/2010   TAH BS and APR  . COLONOSCOPY  01/14/2011   5 yrs  . LEFT TOTAL HIP ARTHROPLASTY Left 01/17/2014  . L4-5 laminectomy, bilateral L4 & L5 foraminotomies, L4-5 interbody arthrodesis, L4-5posterolateral arthrodesis utilizing nonsegmental pedical screw instrumentation  03/14/2014   Dr. Victory Gunnels  . endoscopic partial plantar fascial release left heel Left 02/2016   [place]  . COLONOSCOPY  05/13/2016   Poor colon prep/Rescheduled to 05/20/2016  . EGD  05/13/2016   Reflux esophagitis/Gastritis/No Repeat/MUS  . COLONOSCOPY  05/20/2016   Redundant  colon/Otherwise normal/PHx colon polyps/Repeat 39yrs/MUS  . EGD  10/12/2018   Esophagitis/Gastritis/No Repeat/MUS  . L3-4 LATERAL INTERBODY FUSION WITH PLATING  11/22/2020   Dr. Reeves Daisy at [facility], [place]  . COLONOSCOPY  06/22/2021   Normal colon biopsy/PHx CP/Repeat 17yrs/TKT  . EGD  03/10/2023   GERD/NoRpt/TKT  . back surgery  March 5th 2022  . Gastric bypass surgery    . JOINT REPLACEMENT    . SPINE SURGERY      FAMILY HISTORY Family History  Problem Relation Name Age of Onset  . High blood pressure (Hypertension) Mother    . Diabetes type II Mother    . Osteoporosis (Thinning of bones) Mother    . Asthma Mother    .  Allergies Mother    . Mental illness Mother    . Rheum arthritis Mother    . High blood pressure (Hypertension) Father    . Coronary Artery Disease (Blocked arteries around heart) Father    . Myocardial Infarction (Heart attack) Father  77  . High blood pressure (Hypertension) Sister    . Aortic aneurysm Maternal Aunt    . High blood pressure (Hypertension) Maternal Grandmother    . Diabetes type II Maternal Grandmother    . Osteoporosis (Thinning of bones) Maternal Grandmother    . Prostate cancer Maternal Grandfather    . Coronary Artery Disease (Blocked arteries around heart) Maternal Grandfather    . Colon cancer Maternal Grandfather    . Cervical cancer Neg Hx    . Ovarian cancer Neg Hx    . Uterine cancer Neg Hx      SOCIAL HISTORY  Social History   Tobacco Use  . Smoking status: Former  . Smokeless tobacco: Never  Vaping Use  . Vaping status: Never Used  Substance Use Topics  . Alcohol use: Not Currently    Alcohol/week: 0.0 standard drinks of alcohol  . Drug use: No     REVIEW OF SYSTEMS:  13 system ROS was verbally reviewed with patient. Pertinent positives and negatives are mentioned above in the HPI and all other systems are negative.   DATA   Ancillary Procedure on 03/01/2024  Component Date Value Ref Range Status  . LV  Ejection Fraction (%) 03/01/2024 55  % Final  . Right Ventricle Systolic Pressure * 03/01/2024 30  mmHg Final  . Left Atrium Diameter (cm) 03/01/2024 4.2  cm Final  . LV End Diastolic Diameter (cm) 03/01/2024 4.8  cm Final  . LV End Systolic Diameter (cm) 03/01/2024 3.4  cm Final  . LV Septum Wall Thickness (cm) 03/01/2024 1.1  cm Final  . LV Posterior Wall Thickness (cm) 03/01/2024 1.0  cm Final  . Tricuspid Valve Regurgitation Grade 03/01/2024 mild   Final  . Tricuspid Valve Regurgitation Max * 03/01/2024 2.2  m/s Final  . Mitral Valve Regurgitation Grade 03/01/2024 trivial   Final  . Mitral Valve Stenosis Grade 03/01/2024 none   Final  . Mitral Valve Stenosis Mean Gradien* 03/01/2024 2  mmHg Final  . Aortic Valve Regurgitation Grade 03/01/2024 none   Final  . Aortic Valve Stenosis Grade 03/01/2024 none   Final  . Aortic Valve Stenosis Mean Gradien* 03/01/2024 3  mmHg Final  . Aortic Valve Max Velocity (m/s) 03/01/2024 1.3  m/s Final  Appointment on 02/23/2024  Component Date Value Ref Range Status  . WBC (White Blood Cell Count) 02/23/2024 6.0  4.1 - 10.2 10^3/uL Final  . RBC (Red Blood Cell Count) 02/23/2024 4.20  4.04 - 5.48 10^6/uL Final  . Hemoglobin 02/23/2024 13.1  12.0 - 15.0 gm/dL Final  . Hematocrit 93/97/7974 42.0  35.0 - 47.0 % Final  . MCV (Mean Corpuscular Volume) 02/23/2024 100.0  80.0 - 100.0 fl Final  . MCH (Mean Corpuscular Hemoglobin) 02/23/2024 31.2  27.0 - 31.2 pg Final  . MCHC (Mean Corpuscular Hemoglobin * 02/23/2024 31.2 (L)  32.0 - 36.0 gm/dL Final  . Platelet Count 02/23/2024 177  150 - 450 10^3/uL Final  . RDW-CV (Red Cell Distribution Widt* 02/23/2024 13.7  11.6 - 14.8 % Final  . MPV (Mean Platelet Volume) 02/23/2024 9.6  9.4 - 12.4 fl Final  . Neutrophils 02/23/2024 4.30  1.50 - 7.80 10^3/uL Final  . Lymphocytes 02/23/2024 1.20  1.00 - 3.60 10^3/uL Final  . Mixed Count 02/23/2024 0.50  0.10 - 0.90 10^3/uL Final  . Neutrophil % 02/23/2024 71.7 (H)  32.0 -  70.0 % Final  . Lymphocyte % 02/23/2024 20.3  10.0 - 50.0 % Final  . Mixed % 02/23/2024 8.0  3.0 - 14.4 % Final  . Glucose 02/23/2024 99  70 - 110 mg/dL Final  . Sodium 93/97/7974 142  136 - 145 mmol/L Final  . Potassium 02/23/2024 4.3  3.6 - 5.1 mmol/L Final  . Chloride 02/23/2024 108  97 - 109 mmol/L Final  . Carbon Dioxide (CO2) 02/23/2024 27.8  22.0 - 32.0 mmol/L Final  . Urea Nitrogen (BUN) 02/23/2024 22  7 - 25 mg/dL Final  . Creatinine 93/97/7974 0.6  0.6 - 1.1 mg/dL Final  . Glomerular Filtration Rate (eGFR) 02/23/2024 100  >60 mL/min/1.73sq m Final  . Calcium 02/23/2024 8.3 (L)  8.7 - 10.3 mg/dL Final  . AST  93/97/7974 22  8 - 39 U/L Final  . ALT  02/23/2024 27  5 - 38 U/L Final  . Alk Phos (alkaline Phosphatase) 02/23/2024 106 (H)  34 - 104 U/L Final  . Albumin 02/23/2024 4.1  3.5 - 4.8 g/dL Final  . Bilirubin, Total 02/23/2024 0.5  0.3 - 1.2 mg/dL Final  . Protein, Total 02/23/2024 6.4  6.1 - 7.9 g/dL Final  . A/G Ratio 93/97/7974 1.8  1.0 - 5.0 gm/dL Final  . Color 93/97/7974 Yellow  Colorless, Straw, Light Yellow, Yellow, Dark Yellow Final  . Clarity 02/23/2024 Clear  Clear Final  . Specific Gravity 02/23/2024 >1.030 (H)  1.005 - 1.030 Final  . pH, Urine 02/23/2024 5.5  5.0 - 8.0 Final  . Protein, Urinalysis 02/23/2024 Trace (!)  Negative mg/dL Final  . Glucose, Urinalysis 02/23/2024 Negative  Negative mg/dL Final  . Ketones, Urinalysis 02/23/2024 Negative  Negative mg/dL Final  . Blood, Urinalysis 02/23/2024 Negative  Negative Final  . Nitrite, Urinalysis 02/23/2024 Negative  Negative Final  . Leukocyte Esterase, Urinalysis 02/23/2024 Negative  Negative Final  . Bilirubin, Urinalysis 02/23/2024 Negative  Negative Final  . Urobilinogen, Urinalysis 02/23/2024 0.2  0.2 - 1.0 mg/dL Final  . WBC, UA 93/97/7974 1  <=5 /hpf Final  . Red Blood Cells, Urinalysis 02/23/2024 3  <=3 /hpf Final  . Bacteria, Urinalysis 02/23/2024 0-5  0 - 5 /hpf Final  . Squamous Epithelial Cells,  Urinaly* 02/23/2024 2  /hpf Final  . Cholesterol, Total 02/23/2024 158  100 - 200 mg/dL Final  . Triglyceride 93/97/7974 71  35 - 199 mg/dL Final  . HDL (High Density Lipoprotein) Cho* 02/23/2024 50.1  35.0 - 85.0 mg/dL Final  . LDL Calculated 02/23/2024 94  0 - 130 mg/dL Final  . VLDL Cholesterol 02/23/2024 14  mg/dL Final  . Cholesterol/HDL Ratio 02/23/2024 3.2   Final  . Vitamin D , 25-Hydroxy - [facility] 02/23/2024 73.9  30.0 - 100.0 ng/mL Final  Appointment on 02/02/2024  Component Date Value Ref Range Status  . Glucose 02/02/2024 105  70 - 110 mg/dL Final  . Sodium 94/87/7974 140  136 - 145 mmol/L Final  . Potassium 02/02/2024 4.3  3.6 - 5.1 mmol/L Final  . Chloride 02/02/2024 106  97 - 109 mmol/L Final  . Carbon Dioxide (CO2) 02/02/2024 30.5  22.0 - 32.0 mmol/L Final  . Calcium 02/02/2024 8.8  8.7 - 10.3 mg/dL Final  . Urea Nitrogen (BUN) 02/02/2024 19  7 - 25 mg/dL Final  . Creatinine 94/87/7974 0.6  0.6 -  1.1 mg/dL Final  . Glomerular Filtration Rate (eGFR) 02/02/2024 100  >60 mL/min/1.73sq m Final  . BUN/Crea Ratio 02/02/2024 31.7 (H)  6.0 - 20.0 Final  . Anion Gap w/K 02/02/2024 7.8  6.0 - 16.0 Final  . Vitamin D , 25-Hydroxy - [facility] 02/02/2024 77.8  30.0 - 100.0 ng/mL Final  . Parathyroid Hormone, Intact - [facility] 02/02/2024 51  15 - 65 pg/mL Final      No follow-ups on file.  Payor: MEDICARE / Plan: MEDICARE A AND B / Product Type: Medicare /   This note is partially written by Greig Pouch, scribe, in the presence of and acting as the scribe of [employer], PA-C.    Attestation Statement:   I personally performed the service, non-incident to. (WP)   CELESTE CRAFT CANTWELL, PA

## 2024-07-21 ENCOUNTER — Inpatient Hospital Stay: Payer: Medicare Other | Attending: Nurse Practitioner

## 2024-07-21 ENCOUNTER — Encounter: Payer: Self-pay | Admitting: Nurse Practitioner

## 2024-07-21 ENCOUNTER — Inpatient Hospital Stay (HOSPITAL_BASED_OUTPATIENT_CLINIC_OR_DEPARTMENT_OTHER): Payer: Medicare Other | Admitting: Nurse Practitioner

## 2024-07-21 VITALS — BP 119/93 | HR 87 | Temp 97.4°F | Resp 18 | Ht 63.0 in | Wt 199.3 lb

## 2024-07-21 DIAGNOSIS — Z9884 Bariatric surgery status: Secondary | ICD-10-CM | POA: Diagnosis not present

## 2024-07-21 DIAGNOSIS — E538 Deficiency of other specified B group vitamins: Secondary | ICD-10-CM | POA: Diagnosis not present

## 2024-07-21 DIAGNOSIS — D509 Iron deficiency anemia, unspecified: Secondary | ICD-10-CM

## 2024-07-21 DIAGNOSIS — K9589 Other complications of other bariatric procedure: Secondary | ICD-10-CM

## 2024-07-21 DIAGNOSIS — D508 Other iron deficiency anemias: Secondary | ICD-10-CM

## 2024-07-21 LAB — COMPREHENSIVE METABOLIC PANEL WITH GFR
ALT: 31 U/L (ref 0–44)
AST: 26 U/L (ref 15–41)
Albumin: 4.1 g/dL (ref 3.5–5.0)
Alkaline Phosphatase: 78 U/L (ref 38–126)
Anion gap: 7 (ref 5–15)
BUN: 20 mg/dL (ref 8–23)
CO2: 26 mmol/L (ref 22–32)
Calcium: 8.8 mg/dL — ABNORMAL LOW (ref 8.9–10.3)
Chloride: 102 mmol/L (ref 98–111)
Creatinine, Ser: 0.71 mg/dL (ref 0.44–1.00)
GFR, Estimated: >60 mL/min (ref >60)
Glucose, Bld: 100 mg/dL — ABNORMAL HIGH (ref 70–99)
Potassium: 4.6 mmol/L (ref 3.5–5.1)
Sodium: 135 mmol/L (ref 135–145)
Total Bilirubin: 0.8 mg/dL (ref 0.0–1.2)
Total Protein: 6.9 g/dL (ref 6.5–8.1)

## 2024-07-21 LAB — IRON AND TIBC
Iron: 110 ug/dL (ref 28–170)
Saturation Ratios: 39 % — ABNORMAL HIGH (ref 10.4–31.8)
TIBC: 281 ug/dL (ref 250–450)
UIBC: 171 ug/dL

## 2024-07-21 LAB — VITAMIN B12: Vitamin B-12: 402 pg/mL (ref 180–914)

## 2024-07-21 LAB — CBC
HCT: 40.7 % (ref 36.0–46.0)
Hemoglobin: 12.8 g/dL (ref 12.0–15.0)
MCH: 30.8 pg (ref 26.0–34.0)
MCHC: 31.4 g/dL (ref 30.0–36.0)
MCV: 98.1 fL (ref 80.0–100.0)
Platelets: 197 K/uL (ref 150–400)
RBC: 4.15 MIL/uL (ref 3.87–5.11)
RDW: 12.6 % (ref 11.5–15.5)
WBC: 5 K/uL (ref 4.0–10.5)
nRBC: 0 % (ref 0.0–0.2)

## 2024-07-21 LAB — FERRITIN: Ferritin: 133 ng/mL (ref 11–307)

## 2024-07-21 NOTE — Progress Notes (Signed)
C/o feeling tired

## 2024-07-21 NOTE — Addendum Note (Signed)
 Addended by: LANELL JACOBSEN on: 07/21/2024 07:44 PM   Modules accepted: Orders

## 2024-07-21 NOTE — Progress Notes (Signed)
 Hematology/Oncology Consult note [facility]  [phone] [phone]  Patient Care Team: Valora Lynwood FALCON, MD as PCP - General (Family Medicine) Maree Jannett POUR, MD as Consulting Physician (Neurology) Melanee Annah BROCKS, MD as Consulting Physician (Oncology)   Name of the patient: [name]  [id number]  1961/08/31   Date of visit: 07/21/24  Diagnosis- history of iron  and B12 deficiency anemia secondary to gastric bypass     Chief complaint/ Reason for visit-routine follow-up anemia  Heme/Onc history: Patient is a 63 year old female with history of gastric bypass in 1998.  She has a history of iron  and B12 deficiency anemia secondary to it.  She self administers B12 injections at home she has received both Feraheme  and Venofer  in the past.  Today patient reports overall feeling okay she does have a history of peripheral neuropathy followed by neurology takes gabapentin  she reports that this is a little worse here lately.  She also reports feeling increasingly fatigued over the past few weeks which is usually a sign that her iron  levels are low.  I reviewed her lab work with her today ferritin and iron  panel pending hemoglobin at 12.8 she denies seeing any signs and symptoms of blood in the stool.  She reports that she continues to take her monthly B12 injections at home without difficulty.  Nursing staff is working on getting authorization for iron  infusion either this week or next.  Post this iron  infusion patient in agreement with plan to return in 6 months for repeat labs see NP/MD with possible iron  infusion.  She denies any shortness of breath today she recently was worked up for shortness of breath the cardiology in which workup was negative.  She denies any dizziness or feeling faint.       Colonoscopy on 05/20/2016 revealed the sigmoid colon, descending colon and transverse colon were significantly redundant. There was some mild  mucosal irritation at about 35-40 cm on withdrawal.  EGD on 10/12/2018 revealed a variable Z-line variable. There was LA Grade B erosive esophagitis. 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  There was bile gastritis. Roux-en-Y gastrojejunostomy with gastrojejunal anastomosis was characterized by an intact staple line and healthy appearing mucosa.    Interval history-patient is doing well overall and denies any specific complaints at this time  ECOG PS- 1 Pain scale- 0   Review of systems- Review of Systems  Constitutional:  Negative for chills, fever, malaise/fatigue and weight loss.  HENT:  Negative for congestion, ear discharge and nosebleeds.   Eyes:  Negative for blurred vision.  Respiratory:  Negative for cough, hemoptysis, sputum production, shortness of breath and wheezing.   Cardiovascular:  Negative for chest pain, palpitations, orthopnea and claudication.  Gastrointestinal:  Negative for abdominal pain, blood in stool, constipation, diarrhea, heartburn, melena, nausea and vomiting.  Genitourinary:  Negative for dysuria, flank pain, frequency, hematuria and urgency.  Musculoskeletal:  Negative for back pain, joint pain and myalgias.  Skin:  Negative for rash.  Neurological:  Negative for dizziness, tingling, focal weakness, seizures, weakness and headaches.  Endo/Heme/Allergies:  Does not bruise/bleed easily.  Psychiatric/Behavioral:  Negative for depression and suicidal ideas. The patient does not have insomnia.       Allergies  Allergen Reactions   Prednisone  Palpitations    Will take if needed     Past Medical History:  Diagnosis Date   Anemia    Anxiety    Arthritis    hands, fingers, lower back   Complication  of anesthesia    loss control of bowels after block for hip replacement   Depression    Gastritis    GERD (gastroesophageal reflux disease)    Headache    hx of migraines in past   Hypertension    IDA (iron  deficiency anemia) 03/17/2015   IDA (iron   deficiency anemia) 03/17/2015   Neck pain    s/p fall approx 1 month ago.  Transient. Random     Past Surgical History:  Procedure Laterality Date   ABDOMINAL HYSTERECTOMY  2008   ANTERIOR LATERAL LUMBAR FUSION WITH PERCUTANEOUS SCREW 1 LEVEL N/A 11/22/2020   Procedure: L3-4 LATERAL INTERBODY FUSION WITH PLATING;  Surgeon: Clois Fret, MD;  Location: [facility] ORS;  Service: Neurosurgery;  Laterality: N/A;  3rd case   APPENDECTOMY     BACK SURGERY  03/14/14   L4-5 laminectomy.  Dr Malcolm, [facility]   CHOLECYSTECTOMY     COLONOSCOPY     COLONOSCOPY WITH PROPOFOL  N/A 05/13/2016   Procedure: COLONOSCOPY WITH PROPOFOL ;  Surgeon: Gladis RAYMOND Mariner, MD;  Location: [facility] ENDOSCOPY;  Service: Endoscopy;  Laterality: N/A;   COLONOSCOPY WITH PROPOFOL  N/A 05/20/2016   Procedure: COLONOSCOPY WITH PROPOFOL ;  Surgeon: Gladis RAYMOND Mariner, MD;  Location: [facility] ENDOSCOPY;  Service: Endoscopy;  Laterality: N/A;   ESOPHAGOGASTRODUODENOSCOPY (EGD) WITH PROPOFOL  N/A 05/13/2016   Procedure: ESOPHAGOGASTRODUODENOSCOPY (EGD) WITH PROPOFOL ;  Surgeon: Gladis RAYMOND Mariner, MD;  Location: [facility] ENDOSCOPY;  Service: Endoscopy;  Laterality: N/A;   ESOPHAGOGASTRODUODENOSCOPY (EGD) WITH PROPOFOL  N/A 10/12/2018   Procedure: ESOPHAGOGASTRODUODENOSCOPY (EGD) WITH PROPOFOL ;  Surgeon: Mariner Gladis RAYMOND, MD;  Location: [facility] ENDOSCOPY;  Service: Endoscopy;  Laterality: N/A;   GASTRIC BYPASS  2002   JOINT REPLACEMENT Left 12/2013   hip replacement at [facility]   PLANTAR FASCIA RELEASE Left 03/07/2016   Procedure: 1. Partial plantar fascial release with endoscopic procedure   2. Topaz fasciotomy percutaneously;  Surgeon: Donnice Cory, DPM;  Location: [facility];  Service: Podiatry;  Laterality: Left;  LMA WITH POPLITEAL TOPAZ   TONSILLECTOMY      Social History   Socioeconomic History   Marital status: Married    Spouse name: Not on file   Number of children: 1   Years of education: 12   Highest education level: High school  graduate  Occupational History   Occupation: Disabled  Tobacco Use   Smoking status: Never   Smokeless tobacco: Never  Vaping Use   Vaping status: Never Used  Substance and Sexual Activity   Alcohol use: Not Currently   Drug use: No   Sexual activity: Yes  Other Topics Concern   Not on file  Social History Narrative   Lives at home with husband.   Right-handed.   No caffeine use.   Social Drivers of Corporate Investment Banker Strain: Not on file  Food Insecurity: Not on file  Transportation Needs: Not on file  Physical Activity: Not on file  Stress: Not on file  Social Connections: Not on file  Intimate Partner Violence: Not on file    Family History  Problem Relation Age of Onset   Diabetes Mother    Stroke Mother    Heart attack Father        died at age 5   Heart disease Father    Macular degeneration Maternal Grandmother    Dementia Maternal Grandmother    Prostate cancer Maternal Grandfather    Heart attack Paternal Grandmother    Heart disease Paternal Grandmother    Heart  attack Paternal Grandfather    Heart disease Paternal Grandfather    Breast cancer Neg Hx      Current Outpatient Medications:    aspirin 81 MG tablet, Take 81 mg by mouth daily., Disp: , Rfl:    Calcium Carbonate-Vitamin D  600-400 MG-UNIT tablet, Take 2 tablets by mouth 2 (two) times daily., Disp: , Rfl:    Coenzyme Q10 100 MG capsule, Take 100 mg by mouth daily., Disp: , Rfl:    cyanocobalamin  (VITAMIN B12) 1000 MCG/ML injection, INJECT 1 ML (1,000 MCG) INTRAMUSCULARLY EVERY 30 DAYS, Disp: 3 mL, Rfl: 9   fexofenadine (ALLEGRA) 180 MG tablet, Take 180 mg by mouth daily as needed for allergies., Disp: , Rfl:    fluticasone  (FLONASE ) 50 MCG/ACT nasal spray, Place 2 sprays into both nostrils daily., Disp: 16 g, Rfl: 0   gabapentin  (NEURONTIN ) 600 MG tablet, Take 600 mg by mouth 2 (two) times daily., Disp: , Rfl:    hyoscyamine (LEVSIN SL) 0.125 MG SL tablet, Place under the tongue every  6 (six) hours as needed., Disp: , Rfl:    lipase/protease/amylase (CREON) 12000-38000 units CPEP capsule, Take by mouth., Disp: , Rfl:    losartan  (COZAAR ) 50 MG tablet, Take 50 mg by mouth daily., Disp: , Rfl:    meloxicam (MOBIC) 15 MG tablet, Take 1 tablet by mouth daily., Disp: , Rfl:    methocarbamol  (ROBAXIN ) 500 MG tablet, Take 1 tablet by mouth every 6 (six) hours as needed., Disp: , Rfl:    metoprolol  tartrate (LOPRESSOR ) 50 MG tablet, Take 1 tablet (50 mg total) by mouth once for 1 dose. Take 2 hr prior to CT scan, Disp: 1 tablet, Rfl: 0   Multiple Vitamin (MULTIVITAMIN) tablet, Take 1 tablet by mouth daily., Disp: , Rfl:    Omega-3 Fatty Acids (FISH OIL) 1200 MG CAPS, Take 1,200 mg by mouth daily. , Disp: , Rfl:    pantoprazole  (PROTONIX ) 40 MG tablet, Take 40 mg by mouth 2 (two) times daily. , Disp: , Rfl: 2   sertraline  (ZOLOFT ) 100 MG tablet, Take 1 tablet (100 mg total) by mouth daily., Disp: 90 tablet, Rfl: 1   sucralfate  (CARAFATE ) 1 g tablet, Take 1 g by mouth 2 (two) times daily. , Disp: , Rfl:    Vitamin D , Ergocalciferol , (DRISDOL) 50000 units CAPS capsule, Take 50,000 Units by mouth 2 (two) times a week., Disp: , Rfl: 3  Physical exam:  Vitals:   07/21/24 1400 07/21/24 1427  BP: (!) 122/101 (!) 119/93  Pulse: 87   Resp: 18   Temp: (!) 97.4 F (36.3 C)   TempSrc: Tympanic   SpO2: 100%   Weight: 199 lb 4.8 oz (90.4 kg)   Height: 5' 3 (1.6 m)     Physical Exam Cardiovascular:     Rate and Rhythm: Normal rate and regular rhythm.     Heart sounds: Normal heart sounds.  Pulmonary:     Effort: Pulmonary effort is normal.     Breath sounds: Normal breath sounds.  Abdominal:     General: Bowel sounds are normal.     Palpations: Abdomen is soft.  Skin:    General: Skin is warm and dry.  Neurological:     Mental Status: She is alert and oriented to person, place, and time.         Latest Ref Rng & Units 07/21/2024    2:05 PM  CMP  Glucose 70 - 99 mg/dL 899    BUN 8 -  23 mg/dL 20   Creatinine 9.55 - 1.00 mg/dL 9.28   Sodium 864 - 854 mmol/L 135   Potassium 3.5 - 5.1 mmol/L 4.6   Chloride 98 - 111 mmol/L 102   CO2 22 - 32 mmol/L 26   Calcium 8.9 - 10.3 mg/dL 8.8   Total Protein 6.5 - 8.1 g/dL 6.9   Total Bilirubin 0.0 - 1.2 mg/dL 0.8   Alkaline Phos 38 - 126 U/L 78   AST 15 - 41 U/L 26   ALT 0 - 44 U/L 31       Latest Ref Rng & Units 07/21/2024    2:05 PM  CBC  WBC 4.0 - 10.5 K/uL 5.0   Hemoglobin 12.0 - 15.0 g/dL 87.1   Hematocrit 63.9 - 46.0 % 40.7   Platelets 150 - 400 K/uL 197     Assessment and plan- Patient is a 63 y.o. female here for routine follow-up of iron  and B12 deficiency anemia likely secondary to gastric bypass  Patient last required IV iron  in in May 2025.  Presently she is not anemic with an H&H of 12.8/40.7.  Ferritin level and iron  studies are pending.  Vitamin B12 level pending.  She reports increased symptoms of neuropathy and increased fatigue.  Nursing staff working on authorization for iron  infusion either this week or next and then we will proceed.  I will repeat labs in 6 months with possible iron  infusion.   Disposition: Awaiting authorization for Iron  infusion once received schedule for this week or next F/u in 6 mths cbc/cmp/ferritin/iron  and TIBC see np/md +/- iron  infusion

## 2024-07-22 ENCOUNTER — Encounter: Payer: Self-pay | Admitting: Internal Medicine

## 2024-07-22 ENCOUNTER — Encounter: Payer: Self-pay | Admitting: Oncology

## 2024-07-22 NOTE — Addendum Note (Signed)
 Addended by: LANELL JACOBSEN on: 07/22/2024 02:56 PM   Modules accepted: Orders

## 2024-07-23 ENCOUNTER — Inpatient Hospital Stay

## 2024-07-23 VITALS — BP 116/81 | HR 72 | Temp 98.9°F | Resp 16

## 2024-07-23 DIAGNOSIS — D509 Iron deficiency anemia, unspecified: Secondary | ICD-10-CM | POA: Diagnosis not present

## 2024-07-23 DIAGNOSIS — D508 Other iron deficiency anemias: Secondary | ICD-10-CM

## 2024-07-23 MED ORDER — IRON SUCROSE 20 MG/ML IV SOLN
200.0000 mg | Freq: Once | INTRAVENOUS | Status: AC
  Administered 2024-07-23: 200 mg via INTRAVENOUS
  Filled 2024-07-23: qty 10

## 2024-07-23 NOTE — Patient Instructions (Signed)

## 2024-08-17 ENCOUNTER — Emergency Department
Admission: EM | Admit: 2024-08-17 | Discharge: 2024-08-18 | Disposition: A | Discharge status: Home / Self Care | Attending: Emergency Medicine | Admitting: Emergency Medicine

## 2024-08-17 ENCOUNTER — Other Ambulatory Visit: Payer: Self-pay

## 2024-08-17 ENCOUNTER — Emergency Department

## 2024-08-17 DIAGNOSIS — S0990XA Unspecified injury of head, initial encounter: Secondary | ICD-10-CM | POA: Diagnosis present

## 2024-08-17 DIAGNOSIS — M79601 Pain in right arm: Secondary | ICD-10-CM

## 2024-08-17 DIAGNOSIS — S020XXA Fracture of vault of skull, initial encounter for closed fracture: Secondary | ICD-10-CM | POA: Diagnosis not present

## 2024-08-17 DIAGNOSIS — S60221A Contusion of right hand, initial encounter: Secondary | ICD-10-CM | POA: Insufficient documentation

## 2024-08-17 DIAGNOSIS — S065X0A Traumatic subdural hemorrhage without loss of consciousness, initial encounter: Secondary | ICD-10-CM | POA: Insufficient documentation

## 2024-08-17 DIAGNOSIS — W108XXA Fall (on) (from) other stairs and steps, initial encounter: Secondary | ICD-10-CM | POA: Diagnosis not present

## 2024-08-17 DIAGNOSIS — S065XAA Traumatic subdural hemorrhage with loss of consciousness status unknown, initial encounter: Secondary | ICD-10-CM

## 2024-08-17 DIAGNOSIS — S0291XA Unspecified fracture of skull, initial encounter for closed fracture: Secondary | ICD-10-CM

## 2024-08-17 NOTE — ED Provider Notes (Signed)
 [name] Provider Note    Event Date/Time   First MD Initiated Contact with Patient 08/17/24 2213     (approximate)   History   Fall, Head Injury, and Headache   HPI  [name] is a 63 y.o. female with history of anxiety, arthritis, gastritis, presenting with head pain and right arm pain after a fall.  Patient states that she tripped over her cat, fell down 15 steps, landed on the back of her head.  No LOC, states that she feels a burning pain to her right arm and forearm, does have bruising to her right hand.  No actual numbness or tingling, no focal weakness.  Denies any back pain or neck pain.  States that she does not take any blood thinning medications.  No pain anywhere else.  On independent chart review, she was seen by neurology in September, does have history of imbalance and bilateral leg weakness that is ongoing, also history of brain fog after starting gabapentin .  Has ongoing neuropathy in bilateral legs and hands.     Physical Exam   Triage Vital Signs: ED Triage Vitals  Encounter Vitals Group     BP --      Girls Systolic BP Percentile --      Girls Diastolic BP Percentile --      Boys Systolic BP Percentile --      Boys Diastolic BP Percentile --      Pulse --      Resp --      Temp --      Temp src --      SpO2 --      Weight 08/17/24 2215 200 lb (90.7 kg)     Height --      Head Circumference --      Peak Flow --      Pain Score 08/17/24 2214 10     Pain Loc --      Pain Education --      Exclude from Growth Chart --     Most recent vital signs: Vitals:   08/17/24 2215 08/17/24 2224  BP: 137/86   Pulse: 81   Resp: 20   Temp:  97.6 F (36.4 C)  SpO2: 100%      General: Awake, no distress.  CV:  Good peripheral perfusion.  Resp:  Normal effort.  No thoracic cage tenderness Abd:  No distention.  Soft nontender Other:  No midline spinal tenderness, does have some tenderness to the posterior right  occipital region.  No facial deformities or tenderness, no tenderness to her bilateral lower extremity or her left upper extremity, she does have tenderness to her right forearm and arm, does have bruising to the dorsum of her right hand without bony tenderness, no scaphoid tenderness, grip strength is intact bilaterally, sensation is equal to all 4 extremities, no focal weakness to her feet, equal radial and DP pulses bilaterally.   ED Results / Procedures / Treatments   Labs (all labs ordered are listed, but only abnormal results are displayed) Labs Reviewed  BASIC METABOLIC PANEL WITH GFR  CBC WITH DIFFERENTIAL/PLATELET  PROTIME-INR      RADIOLOGY On my independent interpretation, CT head shows subdural hemorrhage   PROCEDURES:  Critical Care performed: No  Procedures   MEDICATIONS ORDERED IN ED: Medications - No data to display   IMPRESSION / MDM / ASSESSMENT AND PLAN / ED COURSE  I reviewed the triage vital signs and  the nursing notes.                              Differential diagnosis includes, but is not limited to, fall appears to be mechanical, consider contusion, strain, sprain, intracranial hemorrhage.  Will get CT head, cervical spine, x-rays of her arm and hand.  Patient's presentation is most consistent with acute presentation with potential threat to life or bodily function.  Independent interpretation of imaging below.  Will order some labs, patient signed out pending neurosurgery consultation, repeat CT.    Clinical Course as of 08/17/24 2342  Tue Aug 17, 2024  2312 DG Hand Complete Right 1. No acute bony abnormality  [TT]  2312 DG Forearm Right 1. No acute bony abnormality.  [TT]  2312 DG Humerus Right 1. No acute abnormality.  [TT]  2312 CT Head Wo Contrast IMPRESSION: 1. Acute right frontoparietal convexity subdural hematoma measuring up to 4 mm without significant mass effect or midline shift. 2. Suspected trace left anterior parafalcine  subdural hematoma measuring 2 mm in thickness. 3. Possible nondisplaced left frontal bone fracture extending to the cranial vertex. 4. Right parietooccipital scalp hematoma.   [TT]  2313 Received a call from radiology about patient's right frontal at parietal subdural hematoma as well as left anterior parafalcine subdural hematoma and nondisplaced left frontal skull fracture.  States no obvious fractures on CT cervical spine.  Will reach out to neurosurgery.  Also repeat CT head in 6 hours. [TT]  2320 Discussed with patient and family about imaging results, and waiting on a callback from neurosurgery. [TT]  2321 CT Cervical Spine Wo Contrast IMPRESSION: 1. No acute abnormality of the cervical spine. 2. Mild reversal of cervical lordosis and trace anterior listhesis of C3 on C4 with maintained facet alignment. 3. Advanced disc space narrowing at C4-C5 and C5-C6   [TT]    Clinical Course User Index [TT] Waymond Lorelle Cummins, MD     FINAL CLINICAL IMPRESSION(S) / ED DIAGNOSES   Final diagnoses:  Injury of head, initial encounter  Closed fracture of skull, unspecified bone, initial encounter (HCC)  SDH (subdural hematoma) (HCC)  Pain of right upper extremity     Rx / DC Orders   ED Discharge Orders     None        Note:  This document was prepared using Dragon voice recognition software and may include unintentional dictation errors.    Waymond Lorelle Cummins, MD 08/17/24 [phone]

## 2024-08-17 NOTE — ED Triage Notes (Addendum)
 Pt to ED by EMS, pt tripped over her cat and fell down approximately 15 steps. Pt did hit her head, no bleeding, no LOC, no blood thinners. Pt complaining of tingling sensation in right arm and headache. Pt A&Ox4. Pt also complains of nausea.

## 2024-08-18 ENCOUNTER — Emergency Department

## 2024-08-18 LAB — CBC WITH DIFFERENTIAL/PLATELET
Abs Immature Granulocytes: 0.06 K/uL (ref 0.00–0.07)
Basophils Absolute: 0 K/uL (ref 0.0–0.1)
Basophils Relative: 0 %
Eosinophils Absolute: 0.1 K/uL (ref 0.0–0.5)
Eosinophils Relative: 2 %
HCT: 35.8 % — ABNORMAL LOW (ref 36.0–46.0)
Hemoglobin: 11.5 g/dL — ABNORMAL LOW (ref 12.0–15.0)
Immature Granulocytes: 1 %
Lymphocytes Relative: 16 %
Lymphs Abs: 1.2 K/uL (ref 0.7–4.0)
MCH: 31.3 pg (ref 26.0–34.0)
MCHC: 32.1 g/dL (ref 30.0–36.0)
MCV: 97.5 fL (ref 80.0–100.0)
Monocytes Absolute: 0.9 K/uL (ref 0.1–1.0)
Monocytes Relative: 11 %
Neutro Abs: 5.6 K/uL (ref 1.7–7.7)
Neutrophils Relative %: 70 %
Platelets: 182 K/uL (ref 150–400)
RBC: 3.67 MIL/uL — ABNORMAL LOW (ref 3.87–5.11)
RDW: 12.8 % (ref 11.5–15.5)
WBC: 7.9 K/uL (ref 4.0–10.5)
nRBC: 0 % (ref 0.0–0.2)

## 2024-08-18 LAB — PROTIME-INR
INR: 1 (ref 0.8–1.2)
Prothrombin Time: 13.6 s (ref 11.4–15.2)

## 2024-08-18 LAB — BASIC METABOLIC PANEL WITH GFR
Anion gap: 8 (ref 5–15)
BUN: 23 mg/dL (ref 8–23)
CO2: 26 mmol/L (ref 22–32)
Calcium: 8.7 mg/dL — ABNORMAL LOW (ref 8.9–10.3)
Chloride: 106 mmol/L (ref 98–111)
Creatinine, Ser: 0.57 mg/dL (ref 0.44–1.00)
GFR, Estimated: >60 mL/min (ref >60)
Glucose, Bld: 109 mg/dL — ABNORMAL HIGH (ref 70–99)
Potassium: 4.4 mmol/L (ref 3.5–5.1)
Sodium: 140 mmol/L (ref 135–145)

## 2024-08-18 MED ORDER — LEVETIRACETAM 500 MG PO TABS: 500.0000 mg | ORAL_TABLET | Freq: Two times a day (BID) | ORAL | 0 refills | Status: DC

## 2024-08-18 MED ORDER — ONDANSETRON 4 MG PO TBDP: 4.0000 mg | ORAL_TABLET | Freq: Three times a day (TID) | ORAL | 0 refills | Status: DC | PRN

## 2024-08-18 MED ORDER — LEVETIRACETAM 500 MG PO TABS
500.0000 mg | ORAL_TABLET | Freq: Once | ORAL | Status: AC
  Administered 2024-08-18: 500 mg via ORAL
  Filled 2024-08-18: qty 1

## 2024-08-18 NOTE — ED Notes (Signed)
 Report received by previous RN. AVS has been discussed with pt. Pt family from [facility] and in route to pick up pt. Pt currently resting in the hallway.

## 2024-08-18 NOTE — ED Notes (Signed)
 Fall Bundle in place

## 2024-08-18 NOTE — ED Notes (Signed)
 Pt called out to use the BR. Pt back to bed. Posey alarm back on.

## 2024-08-18 NOTE — Discharge Instructions (Signed)
 Please take your seizure medication as prescribed for the next 7 days.  Please hold your aspirin for the next 7 days.  Please call the number provided for neurosurgery to arrange a follow-up appointment in approximately 1 week for recheck/reevaluation.  Return to the emergency department for any symptom personally concerning to yourself.  You may use Tylenol  as needed for discomfort/headache as written on the box.

## 2024-08-18 NOTE — ED Provider Notes (Signed)
-----------------------------------------   5:16 AM on 08/18/2024 ----------------------------------------- Patient care assumed from Dr. Waymond.  Patient CT scan has resulted showing right and left sided small subdurals.  I spoke to Dr. Penne Sharps of neurosurgery who recommended a 6-hour repeat.  6-hour repeat shows no change in the patient's subdurals.  He will follow-up with the patient in the office.  He did recommend placing the patient on Keppra  500 mg twice daily.  Patient remains awake alert states minimal discomfort.  Patient agreeable plan of care to follow-up with neurosurgery.  We will have the patient hold her daily aspirin for the next 1 week.   Dorothyann Drivers, MD 08/18/24 [phone]

## 2024-08-18 NOTE — ED Notes (Signed)
 Assisted pt to the commode in the room and then back to bed. Falls bundle in place

## 2024-08-24 ENCOUNTER — Telehealth: Payer: Self-pay | Admitting: Neurosurgery

## 2024-08-24 DIAGNOSIS — S065XAA Traumatic subdural hemorrhage with loss of consciousness status unknown, initial encounter: Secondary | ICD-10-CM

## 2024-08-24 NOTE — Telephone Encounter (Signed)
 Pt is needing to be seen as soon as possible with Dr.Smith for subdural hematoma. Has weakness due to the fall and not being able to do much& pt is unable to shower due to pain. They are afraid of the bleeding leading to an aneurysm. Ok to schedule ? And does she need another CT?

## 2024-08-24 NOTE — Telephone Encounter (Signed)
 Patient states symptoms are the same as they were at ER just lingering. She will go to ER if symptoms get worse. Appointment scheduled for follow up. CT ordered.

## 2024-08-24 NOTE — Telephone Encounter (Signed)
 There are some openings next week as of now. Any other receommendations? Repeat Ct scan?

## 2024-08-30 ENCOUNTER — Ambulatory Visit
Admission: RE | Admit: 2024-08-30 | Discharge: 2024-08-30 | Attending: Physician Assistant | Admitting: Physician Assistant

## 2024-08-30 DIAGNOSIS — S065XAA Traumatic subdural hemorrhage with loss of consciousness status unknown, initial encounter: Secondary | ICD-10-CM

## 2024-08-30 NOTE — Telephone Encounter (Signed)
 Error

## 2024-08-30 NOTE — Progress Notes (Unsigned)
 Referring Physician:  Valora Lynwood FALCON, MD [address] [place],  [place] [address]  Primary Physician:  Valora Lynwood FALCON, MD  History of Present Illness: 08/30/2024 [name] is here today with a chief complaint of ***  subdural hematoma Any headaches or dizziness?   Duration: *** Location: *** Quality: *** Severity: ***  Precipitating: aggravated by *** Modifying factors: made better by *** Weakness: none Timing: *** Bowel/Bladder Dysfunction: none  Conservative measures:  Physical therapy: ***  Multimodal medical therapy including regular antiinflammatories: *** Keppra   Injections: *** epidural steroid injections  Past Surgery: ***  [name] has ***no symptoms of cervical myelopathy.  The symptoms are causing a significant impact on the patient's life.   Review of Systems:  A 10 point review of systems is negative, except for the pertinent positives and negatives detailed in the HPI.  Past Medical History: Past Medical History:  Diagnosis Date   Anemia    Anxiety    Arthritis    hands, fingers, lower back   Complication of anesthesia    loss control of bowels after block for hip replacement   Depression    Gastritis    GERD (gastroesophageal reflux disease)    Headache    hx of migraines in past   Hypertension    IDA (iron  deficiency anemia) 03/17/2015   IDA (iron  deficiency anemia) 03/17/2015   Neck pain    s/p fall approx 1 month ago.  Transient. Random    Past Surgical History: Past Surgical History:  Procedure Laterality Date   ABDOMINAL HYSTERECTOMY  2008   ANTERIOR LATERAL LUMBAR FUSION WITH PERCUTANEOUS SCREW 1 LEVEL N/A 11/22/2020   Procedure: L3-4 LATERAL INTERBODY FUSION WITH PLATING;  Surgeon: Clois Fret, MD;  Location: [facility] ORS;  Service: Neurosurgery;  Laterality: N/A;  3rd case   APPENDECTOMY     BACK SURGERY  03/14/14   L4-5 laminectomy.  Dr Malcolm, [facility]   CHOLECYSTECTOMY      COLONOSCOPY     COLONOSCOPY WITH PROPOFOL  N/A 05/13/2016   Procedure: COLONOSCOPY WITH PROPOFOL ;  Surgeon: Gladis RAYMOND Mariner, MD;  Location: [facility] ENDOSCOPY;  Service: Endoscopy;  Laterality: N/A;   COLONOSCOPY WITH PROPOFOL  N/A 05/20/2016   Procedure: COLONOSCOPY WITH PROPOFOL ;  Surgeon: Gladis RAYMOND Mariner, MD;  Location: [facility] ENDOSCOPY;  Service: Endoscopy;  Laterality: N/A;   ESOPHAGOGASTRODUODENOSCOPY (EGD) WITH PROPOFOL  N/A 05/13/2016   Procedure: ESOPHAGOGASTRODUODENOSCOPY (EGD) WITH PROPOFOL ;  Surgeon: Gladis RAYMOND Mariner, MD;  Location: [facility] ENDOSCOPY;  Service: Endoscopy;  Laterality: N/A;   ESOPHAGOGASTRODUODENOSCOPY (EGD) WITH PROPOFOL  N/A 10/12/2018   Procedure: ESOPHAGOGASTRODUODENOSCOPY (EGD) WITH PROPOFOL ;  Surgeon: Mariner Gladis RAYMOND, MD;  Location: [facility] ENDOSCOPY;  Service: Endoscopy;  Laterality: N/A;   GASTRIC BYPASS  2002   JOINT REPLACEMENT Left 12/2013   hip replacement at [facility]   PLANTAR FASCIA RELEASE Left 03/07/2016   Procedure: 1. Partial plantar fascial release with endoscopic procedure   2. Topaz fasciotomy percutaneously;  Surgeon: Donnice Cory, DPM;  Location: [facility];  Service: Podiatry;  Laterality: Left;  LMA WITH POPLITEAL TOPAZ   TONSILLECTOMY      Allergies: Allergies as of 09/01/2024 - Review Complete 08/17/2024  Allergen Reaction Noted   Prednisone  Palpitations 04/23/2018    Medications: Outpatient Encounter Medications as of 09/01/2024  Medication Sig   aspirin 81 MG tablet Take 81 mg by mouth daily.   Calcium Carbonate-Vitamin D  600-400 MG-UNIT tablet Take 2 tablets by mouth 2 (two) times daily.  Coenzyme Q10 100 MG capsule Take 100 mg by mouth daily.   cyanocobalamin  (VITAMIN B12) 1000 MCG/ML injection INJECT 1 ML (1,000 MCG) INTRAMUSCULARLY EVERY 30 DAYS   fexofenadine (ALLEGRA) 180 MG tablet Take 180 mg by mouth daily as needed for allergies.   fluticasone  (FLONASE ) 50 MCG/ACT nasal spray Place 2 sprays into both nostrils daily.    gabapentin  (NEURONTIN ) 600 MG tablet Take 600 mg by mouth 2 (two) times daily.   hyoscyamine (LEVSIN SL) 0.125 MG SL tablet Place under the tongue every 6 (six) hours as needed.   levETIRAcetam  (KEPPRA ) 500 MG tablet Take 1 tablet (500 mg total) by mouth 2 (two) times daily.   lipase/protease/amylase (CREON) 12000-38000 units CPEP capsule Take by mouth.   losartan  (COZAAR ) 50 MG tablet Take 50 mg by mouth daily.   meloxicam (MOBIC) 15 MG tablet Take 1 tablet by mouth daily.   methocarbamol  (ROBAXIN ) 500 MG tablet Take 1 tablet by mouth every 6 (six) hours as needed.   metoprolol  tartrate (LOPRESSOR ) 50 MG tablet Take 1 tablet (50 mg total) by mouth once for 1 dose. Take 2 hr prior to CT scan   Multiple Vitamin (MULTIVITAMIN) tablet Take 1 tablet by mouth daily.   Omega-3 Fatty Acids (FISH OIL) 1200 MG CAPS Take 1,200 mg by mouth daily.    ondansetron  (ZOFRAN -ODT) 4 MG disintegrating tablet Take 1 tablet (4 mg total) by mouth every 8 (eight) hours as needed for nausea or vomiting.   pantoprazole  (PROTONIX ) 40 MG tablet Take 40 mg by mouth 2 (two) times daily.    sertraline  (ZOLOFT ) 100 MG tablet Take 1 tablet (100 mg total) by mouth daily.   sucralfate  (CARAFATE ) 1 g tablet Take 1 g by mouth 2 (two) times daily.    Vitamin D , Ergocalciferol , (DRISDOL) 50000 units CAPS capsule Take 50,000 Units by mouth 2 (two) times a week.   [DISCONTINUED] escitalopram (LEXAPRO) 10 MG tablet Take 10 mg by mouth daily.   No facility-administered encounter medications on file as of 09/01/2024.    Social History: Social History   Tobacco Use   Smoking status: Never   Smokeless tobacco: Never  Vaping Use   Vaping status: Never Used  Substance Use Topics   Alcohol use: Not Currently   Drug use: No    Family Medical History: Family History  Problem Relation Age of Onset   Diabetes Mother    Stroke Mother    Heart attack Father        died at age 69   Heart disease Father    Macular degeneration  Maternal Grandmother    Dementia Maternal Grandmother    Prostate cancer Maternal Grandfather    Heart attack Paternal Grandmother    Heart disease Paternal Grandmother    Heart attack Paternal Grandfather    Heart disease Paternal Grandfather    Breast cancer Neg Hx     Physical Examination: @VITALWITHPAIN @  General: Patient is well developed, well nourished, calm, collected, and in no apparent distress. Attention to examination is appropriate.  Psychiatric: Patient is non-anxious.  Head:  Pupils equal, round, and reactive to light.  ENT:  Oral mucosa appears well hydrated.  Neck:   Supple.  ***Full range of motion.  Respiratory: Patient is breathing without any difficulty.  Extremities: No edema.  Vascular: Palpable dorsal pedal pulses.  Skin:   On exposed skin, there are no abnormal skin lesions.  NEUROLOGICAL:     Awake, alert, oriented to person, place, and time.  Speech  is clear and fluent. Fund of knowledge is appropriate.   Cranial Nerves: Pupils equal round and reactive to light.  Facial tone is symmetric.  Facial sensation is symmetric.  ROM of spine: ***full.  Palpation of spine: ***non tender.    Strength: Side Biceps Triceps Deltoid Interossei Grip Wrist Ext. Wrist Flex.  R 5 5 5 5 5 5 5   L 5 5 5 5 5 5 5    Side Iliopsoas Quads Hamstring PF DF EHL  R 5 5 5 5 5 5   L 5 5 5 5 5 5    Reflexes are ***2+ and symmetric at the biceps, triceps, brachioradialis, patella and achilles.   Hoffman's is absent.  Clonus is not present.  Toes are down-going.  Bilateral upper and lower extremity sensation is intact to light touch.    Gait is normal.   No difficulty with tandem gait.   No evidence of dysmetria noted.  Medical Decision Making  Imaging: ***  I have personally reviewed the images and agree with the above interpretation.  Assessment and Plan: [name] is a pleasant 63 y.o. female with ***    Thank you for involving me in the care of this  patient.   I spent a total of *** minutes in both face-to-face and non-face-to-face activities for this visit on the date of this encounter.   Lyle Decamp, PA-C Dept. of Neurosurgery

## 2024-09-01 ENCOUNTER — Ambulatory Visit

## 2024-09-01 ENCOUNTER — Ambulatory Visit: Admitting: Physician Assistant

## 2024-09-01 ENCOUNTER — Encounter: Payer: Self-pay | Admitting: Physician Assistant

## 2024-09-01 VITALS — BP 118/84 | Ht 63.0 in | Wt 200.0 lb

## 2024-09-01 DIAGNOSIS — M5440 Lumbago with sciatica, unspecified side: Secondary | ICD-10-CM

## 2024-09-01 DIAGNOSIS — M549 Dorsalgia, unspecified: Secondary | ICD-10-CM

## 2024-09-01 DIAGNOSIS — W19XXXA Unspecified fall, initial encounter: Secondary | ICD-10-CM

## 2024-09-01 DIAGNOSIS — H5709 Other anomalies of pupillary function: Secondary | ICD-10-CM | POA: Diagnosis not present

## 2024-09-01 DIAGNOSIS — M545 Low back pain, unspecified: Secondary | ICD-10-CM

## 2024-09-01 DIAGNOSIS — S065XAA Traumatic subdural hemorrhage with loss of consciousness status unknown, initial encounter: Secondary | ICD-10-CM | POA: Diagnosis not present

## 2024-09-12 ENCOUNTER — Ambulatory Visit: Payer: Self-pay | Admitting: Physician Assistant

## 2024-09-14 ENCOUNTER — Other Ambulatory Visit: Payer: Self-pay | Admitting: Physician Assistant

## 2024-09-14 ENCOUNTER — Telehealth: Payer: Self-pay | Admitting: Physician Assistant

## 2024-09-14 DIAGNOSIS — S22089D Unspecified fracture of T11-T12 vertebra, subsequent encounter for fracture with routine healing: Secondary | ICD-10-CM

## 2024-09-14 NOTE — Telephone Encounter (Signed)
 Patient is calling to let our office know that she is having constant 10/10 pain. She would like to know if something can be sent into [facility] in [place] for her pain. She finally would like to know if this is something that will heal on it's own or would require surgery. Please advise.

## 2024-09-15 ENCOUNTER — Ambulatory Visit
Admission: RE | Admit: 2024-09-15 | Discharge: 2024-09-15 | Disposition: A | Discharge status: Home / Self Care | Source: Ambulatory Visit | Attending: Physician Assistant | Admitting: Physician Assistant

## 2024-09-15 DIAGNOSIS — S22089D Unspecified fracture of T11-T12 vertebra, subsequent encounter for fracture with routine healing: Secondary | ICD-10-CM | POA: Insufficient documentation

## 2024-09-21 NOTE — Telephone Encounter (Addendum)
 She can take her methocarbamol  up to every 6 hours as needed. This should help with her pain. Remind her that this can make her sleepy.

## 2024-09-21 NOTE — Telephone Encounter (Signed)
 Patient advised, she has Methocarbamol  but has not taking it in a while but will try it again. She also asked for her CT scan results, as she is in more pain and she has osteoporosis she is worried maybe something changed and it is contributing to her pain. I called radiology room and asked for a read on her scan.

## 2024-09-21 NOTE — Telephone Encounter (Signed)
 Patient just called and states she is still in a lot of pain and would like to know what she can do. She completed her CT scan on 09/15/2024.

## 2024-09-21 NOTE — Telephone Encounter (Signed)
 She is currently taking meloxicam 1 tab daily, gabapentin  600 mg twice a day, and methocarbamol  1 tab at bedtime. Per notes below. [name] did not respond on what else she can take at this time if anything

## 2024-09-22 NOTE — Telephone Encounter (Signed)
 I reviewed her CT and I don't see anything concerning at T12 (area that Lyle was concerned about a fracture).   Hopefully she sees some relief with the robaxin .   Please schedule her a follow up (can do in person, phone, or MyChart) for Lyle to review her CT results with her and determine a follow plan.

## 2024-09-22 NOTE — Telephone Encounter (Signed)
 FYI:  Called patient to let her know the results of CT. She stated she is in a lot of pain and wants to know why. I asked if she was taking the Robaxin , she stated she is not taking that stuff, she currently has 3 bottles in her drawer. It makes her legs feel like rubber bands, she can not move and is afraid to fall. I had offered to make her a sooner appointment to see [facility]. She declined and requested to see Dr. Clois. I explained to her that he sees patients that need surgery or for surgery follow ups, but that Lyle can consult with him if need to. She became upset with me and raised her voice demanding me to make an appointment with Dr. Clois as she did not like Lyle was giving her right care. I placed her on hold while I looked for an appointment. When I came back on the line I gave her the appointment day and time, she apologized for raising her voice at me.

## 2024-09-22 NOTE — Telephone Encounter (Signed)
 CT scan finalized. Will send to [place] and [place] to review

## 2024-09-27 ENCOUNTER — Other Ambulatory Visit: Payer: Self-pay | Admitting: Physician Assistant

## 2024-09-27 DIAGNOSIS — M7918 Myalgia, other site: Secondary | ICD-10-CM

## 2024-09-27 DIAGNOSIS — M5440 Lumbago with sciatica, unspecified side: Secondary | ICD-10-CM

## 2024-09-27 DIAGNOSIS — S22089D Unspecified fracture of T11-T12 vertebra, subsequent encounter for fracture with routine healing: Secondary | ICD-10-CM

## 2024-09-27 DIAGNOSIS — Z981 Arthrodesis status: Secondary | ICD-10-CM

## 2024-09-29 NOTE — Telephone Encounter (Signed)
 Reached out to [facility] to ask if both MRIs could be done same day. Secure messaged scheduler, she stated she will reach out to patient to schedule.

## 2024-09-30 ENCOUNTER — Other Ambulatory Visit

## 2024-09-30 ENCOUNTER — Ambulatory Visit: Admitting: Physician Assistant

## 2024-10-04 ENCOUNTER — Inpatient Hospital Stay
Admission: RE | Admit: 2024-10-04 | Discharge: 2024-10-04 | Attending: Physician Assistant | Admitting: Physician Assistant

## 2024-10-04 ENCOUNTER — Ambulatory Visit
Admission: RE | Admit: 2024-10-04 | Discharge: 2024-10-04 | Disposition: A | Source: Ambulatory Visit | Attending: Physician Assistant | Admitting: Physician Assistant

## 2024-10-04 DIAGNOSIS — M7918 Myalgia, other site: Secondary | ICD-10-CM

## 2024-10-04 DIAGNOSIS — M5440 Lumbago with sciatica, unspecified side: Secondary | ICD-10-CM

## 2024-10-04 DIAGNOSIS — Z981 Arthrodesis status: Secondary | ICD-10-CM

## 2024-10-04 DIAGNOSIS — S22089D Unspecified fracture of T11-T12 vertebra, subsequent encounter for fracture with routine healing: Secondary | ICD-10-CM

## 2024-10-05 ENCOUNTER — Inpatient Hospital Stay: Admission: RE | Admit: 2024-10-05 | Source: Ambulatory Visit

## 2024-10-11 ENCOUNTER — Ambulatory Visit: Payer: Self-pay | Admitting: Physician Assistant

## 2024-10-12 ENCOUNTER — Ambulatory Visit: Admitting: Physician Assistant

## 2024-10-14 ENCOUNTER — Ambulatory Visit: Admitting: Neurosurgery

## 2024-10-14 ENCOUNTER — Encounter: Payer: Self-pay | Admitting: Neurosurgery

## 2024-10-14 VITALS — BP 136/88 | Ht 63.0 in | Wt 200.0 lb

## 2024-10-14 DIAGNOSIS — S065XAD Traumatic subdural hemorrhage with loss of consciousness status unknown, subsequent encounter: Secondary | ICD-10-CM | POA: Diagnosis not present

## 2024-10-14 DIAGNOSIS — W19XXXD Unspecified fall, subsequent encounter: Secondary | ICD-10-CM

## 2024-10-14 DIAGNOSIS — Z981 Arthrodesis status: Secondary | ICD-10-CM | POA: Diagnosis not present

## 2024-10-14 DIAGNOSIS — M5416 Radiculopathy, lumbar region: Secondary | ICD-10-CM | POA: Diagnosis not present

## 2024-10-14 DIAGNOSIS — S0990XS Unspecified injury of head, sequela: Secondary | ICD-10-CM

## 2024-10-14 NOTE — Progress Notes (Signed)
 "   Referring Physician:  Valora Lynwood FALCON, MD [address] [place],  [place] [address]  Primary Physician:  Valora Lynwood FALCON, MD  History of Present Illness: 10/14/2024 Ms. [name] is here today with a chief complaint of prior lumbar spine arthrodesis and osteoporosis who presents for follow-up of right leg pain and lumbar spine symptoms.  Right leg pain began after a head injury on November 25 and a subsequent three-week period of marked inactivity. When she resumed activity, she developed severe right leg pain with episodes of leg instability that required assistance to walk, especially around New Year's Eve. Since early January the pain has markedly improved and is absent today. Prolonged sitting, such as a two-hour movie, causes transient discomfort on standing that resolves with walking. She denies current buttock pain, radiating pain, or new neurological symptoms.  She had lumbar spine arthrodesis four years ago with prior postoperative MRI and CT that showed degenerative and arthritic changes, which she finds difficult to interpret. She reports a family history of severe degenerative spine disease and scoliosis.  She has osteoporosis and is trying to increase activity but does not perform resistance exercises and notes poor upper body strength. She declined physical therapy because of time constraints. She denies any current right leg pain, buttock pain, or new neurological deficits.  Discussed the use of AI scribe software for clinical note transcription with the patient, who gave verbal consent to proceed.  [name] has no symptoms of cervical myelopathy.  The symptoms are causing a significant impact on the patient's life.   I have utilized the care everywhere function in epic to review the outside records available from external health systems.   Review of Systems:  A 10 point review of systems is negative, except for the pertinent  positives and negatives detailed in the HPI.  Past Medical History: Past Medical History:  Diagnosis Date   Anemia    Anxiety    Arthritis    hands, fingers, lower back   Complication of anesthesia    loss control of bowels after block for hip replacement   Depression    Gastritis    GERD (gastroesophageal reflux disease)    Headache    hx of migraines in past   Hypertension    IDA (iron  deficiency anemia) 03/17/2015   IDA (iron  deficiency anemia) 03/17/2015   Neck pain    s/p fall approx 1 month ago.  Transient. Random    Past Surgical History: Past Surgical History:  Procedure Laterality Date   ABDOMINAL HYSTERECTOMY  2008   ANTERIOR LATERAL LUMBAR FUSION WITH PERCUTANEOUS SCREW 1 LEVEL N/A 11/22/2020   Procedure: L3-4 LATERAL INTERBODY FUSION WITH PLATING;  Surgeon: Clois Fret, MD;  Location: [facility] ORS;  Service: Neurosurgery;  Laterality: N/A;  3rd case   APPENDECTOMY     BACK SURGERY  03/14/2014   L4-5 laminectomy.  Dr Malcolm, [facility]   CHOLECYSTECTOMY     COLONOSCOPY     COLONOSCOPY WITH PROPOFOL  N/A 05/13/2016   Procedure: COLONOSCOPY WITH PROPOFOL ;  Surgeon: Gladis RAYMOND Mariner, MD;  [facility] ENDOSCOPY;  Service: Endoscopy;  Laterality: N/A;   COLONOSCOPY WITH PROPOFOL  N/A 05/20/2016   Procedure: COLONOSCOPY WITH PROPOFOL ;  Surgeon: Gladis RAYMOND Mariner, MD;  Location: [facility] ENDOSCOPY;  Service: Endoscopy;  Laterality: N/A;   ESOPHAGOGASTRODUODENOSCOPY (EGD) WITH PROPOFOL  N/A 05/13/2016   Procedure: ESOPHAGOGASTRODUODENOSCOPY (EGD) WITH PROPOFOL ;  Surgeon: Gladis RAYMOND Mariner, MD;  Location: [facility] ENDOSCOPY;  Service: Endoscopy;  Laterality: N/A;   ESOPHAGOGASTRODUODENOSCOPY (EGD) WITH PROPOFOL  N/A 10/12/2018   Procedure: ESOPHAGOGASTRODUODENOSCOPY (EGD) WITH PROPOFOL ;  Surgeon: Gaylyn Gladis PENNER, MD;  Location: [facility] ENDOSCOPY;  Service: Endoscopy;  Laterality: N/A;   GASTRIC BYPASS  2002   JOINT REPLACEMENT Left 12/2013   hip replacement at [facility]   PLANTAR FASCIA  RELEASE Left 03/07/2016   Procedure: 1. Partial plantar fascial release with endoscopic procedure   2. Topaz fasciotomy percutaneously;  Surgeon: Donnice Cory, DPM;  Location: [facility];  Service: Podiatry;  Laterality: Left;  LMA WITH POPLITEAL TOPAZ   TONSILLECTOMY      Allergies: Allergies as of 10/14/2024 - Review Complete 10/14/2024  Allergen Reaction Noted   Prednisone  Palpitations 04/23/2018    Medications: Current Medications[1]  Social History: Social History[2]  Family Medical History: Family History  Problem Relation Age of Onset   Diabetes Mother    Stroke Mother    Heart attack Father        died at age 70   Heart disease Father    Macular degeneration Maternal Grandmother    Dementia Maternal Grandmother    Prostate cancer Maternal Grandfather    Heart attack Paternal Grandmother    Heart disease Paternal Grandmother    Heart attack Paternal Grandfather    Heart disease Paternal Grandfather    Breast cancer Neg Hx     Physical Examination: Vitals:   10/14/24 0933  BP: 136/88    General: Patient is in no apparent distress. Attention to examination is appropriate.  Neck:   Supple.  Full range of motion.  Respiratory: Patient is breathing without any difficulty.   NEUROLOGICAL:     Awake, alert, oriented to person, place, and time.  Speech is clear and fluent.   Cranial Nerves: Pupils equal round and reactive to light.  Facial tone is symmetric.  Facial sensation is symmetric. Shoulder shrug is symmetric. Tongue protrusion is midline.  There is no pronator drift.  Strength: Side Biceps Triceps Deltoid Interossei Grip Wrist Ext. Wrist Flex.  R 5 5 5 5 5 5 5   L 5 5 5 5 5 5 5    Side Iliopsoas Quads Hamstring PF DF EHL  R 5 5 5 5 5 5   L 5 5 5 5 5 5    Reflexes are 1+ and symmetric at the biceps, triceps, brachioradialis, patella and achilles.   Hoffman's is absent.   Bilateral upper and lower extremity sensation is intact to light  touch.    No evidence of dysmetria noted.  Gait is normal.     Medical Decision Making  Imaging: MR L spine 10/04/2024 IMPRESSION: 1. Fracture of the right sacral ala, further described and more fully imaged on same day sacral MRI. 2. No acute osseous findings in the lumbar spine. 3. Status post left lateral and interbody fusion at L3-4 with moderate residual multifactorial spinal stenosis and moderate foraminal narrowing bilaterally. 4. Stable remote L4-5 PLIF. 5. Progressive broad-based right foraminal disc protrusion at L5-S1 with possible right L5 nerve root impingement.     Electronically Signed   By: Elsie Perone M.D.   On: 10/10/2024 14:53  CT L spine 09/15/2024 IMPRESSION: 1. Severe right and mild left L5 foraminal stenosis. 2. Moderate bilateral L3 foraminal stenosis.   Electronically signed by: Franky Stanford MD 09/21/2024 10:09 PM EST RP Workstation: [id number]    I have personally reviewed the images and agree with the above interpretation.  Assessment and Plan: Ms. Stander is a pleasant 64 y.o. female  with symptoms of a right L5 radiculopathy that is much better currently.  She also had a head injury last year.  We discussed starting physical therapy.  She declined for now.  I will give her exercises for her back.  I will see her back in 3 months for follow-up.  Her prior surgeries appear well-healed.  I spent a total of 30 minutes in this patient's care today. This time was spent reviewing pertinent records including imaging studies, obtaining and confirming history, performing a directed evaluation, formulating and discussing my recommendations, and documenting the visit within the medical record.        Thank you for involving me in the care of this patient.      Jawuan Robb K. Clois MD, [facility] Neurosurgery     [1]  Current Outpatient Medications:    aspirin 81 MG tablet, Take 81 mg by mouth daily., Disp: , Rfl:    Calcium Carbonate-Vitamin  D 600-400 MG-UNIT tablet, Take 2 tablets by mouth 2 (two) times daily., Disp: , Rfl:    Coenzyme Q10 100 MG capsule, Take 100 mg by mouth daily., Disp: , Rfl:    cyanocobalamin  (VITAMIN B12) 1000 MCG/ML injection, INJECT 1 ML (1,000 MCG) INTRAMUSCULARLY EVERY 30 DAYS, Disp: 3 mL, Rfl: 9   fexofenadine (ALLEGRA) 180 MG tablet, Take 180 mg by mouth daily as needed for allergies., Disp: , Rfl:    fluticasone  (FLONASE ) 50 MCG/ACT nasal spray, Place 2 sprays into both nostrils daily., Disp: 16 g, Rfl: 0   gabapentin  (NEURONTIN ) 300 MG capsule, Take by mouth., Disp: , Rfl:    gabapentin  (NEURONTIN ) 600 MG tablet, Take 600 mg by mouth 2 (two) times daily., Disp: , Rfl:    lipase/protease/amylase (CREON) 12000-38000 units CPEP capsule, Take by mouth., Disp: , Rfl:    losartan  (COZAAR ) 50 MG tablet, Take 50 mg by mouth daily., Disp: , Rfl:    meloxicam (MOBIC) 15 MG tablet, Take 1 tablet by mouth daily., Disp: , Rfl:    Multiple Vitamin (MULTIVITAMIN) tablet, Take 1 tablet by mouth daily., Disp: , Rfl:    Omega-3 Fatty Acids (FISH OIL) 1200 MG CAPS, Take 1,200 mg by mouth daily. , Disp: , Rfl:    pantoprazole  (PROTONIX ) 40 MG tablet, Take 40 mg by mouth 2 (two) times daily. , Disp: , Rfl: 2   semaglutide-weight management (WEGOVY) 0.5 MG/0.5ML SOAJ SQ injection, Inject 0.5 mg into the skin., Disp: , Rfl:    sertraline  (ZOLOFT ) 100 MG tablet, Take 1 tablet (100 mg total) by mouth daily., Disp: 90 tablet, Rfl: 1   sucralfate  (CARAFATE ) 1 g tablet, Take 1 g by mouth 2 (two) times daily. , Disp: , Rfl:    Vitamin D , Ergocalciferol , (DRISDOL) 50000 units CAPS capsule, Take 50,000 Units by mouth 2 (two) times a week., Disp: , Rfl: 3 [2]  Social History Tobacco Use   Smoking status: Never   Smokeless tobacco: Never  Vaping Use   Vaping status: Never Used  Substance Use Topics   Alcohol use: Not Currently   Drug use: No   "

## 2025-01-06 ENCOUNTER — Ambulatory Visit

## 2025-01-19 ENCOUNTER — Inpatient Hospital Stay

## 2025-01-19 ENCOUNTER — Inpatient Hospital Stay: Admitting: Oncology
